# Patient Record
Sex: Female | Born: 1955 | Race: White | Hispanic: No | Marital: Married | Smoking: Never smoker
Health system: Southern US, Community
[De-identification: ages and names within clinical notes are randomized; demographics above are authoritative.]

## PROBLEM LIST (undated history)

## (undated) DIAGNOSIS — G709 Myoneural disorder, unspecified: Secondary | ICD-10-CM

## (undated) DIAGNOSIS — E785 Hyperlipidemia, unspecified: Secondary | ICD-10-CM

## (undated) DIAGNOSIS — D649 Anemia, unspecified: Secondary | ICD-10-CM

## (undated) DIAGNOSIS — M1990 Unspecified osteoarthritis, unspecified site: Secondary | ICD-10-CM

## (undated) DIAGNOSIS — M1711 Unilateral primary osteoarthritis, right knee: Secondary | ICD-10-CM

## (undated) DIAGNOSIS — G4730 Sleep apnea, unspecified: Secondary | ICD-10-CM

## (undated) DIAGNOSIS — F419 Anxiety disorder, unspecified: Secondary | ICD-10-CM

## (undated) DIAGNOSIS — M2550 Pain in unspecified joint: Secondary | ICD-10-CM

## (undated) DIAGNOSIS — I10 Essential (primary) hypertension: Secondary | ICD-10-CM

## (undated) DIAGNOSIS — G629 Polyneuropathy, unspecified: Secondary | ICD-10-CM

## (undated) DIAGNOSIS — R1310 Dysphagia, unspecified: Secondary | ICD-10-CM

## (undated) DIAGNOSIS — G4700 Insomnia, unspecified: Secondary | ICD-10-CM

## (undated) DIAGNOSIS — Z860100 Personal history of colon polyps, unspecified: Secondary | ICD-10-CM

## (undated) DIAGNOSIS — R252 Cramp and spasm: Secondary | ICD-10-CM

## (undated) DIAGNOSIS — R350 Frequency of micturition: Secondary | ICD-10-CM

## (undated) DIAGNOSIS — H269 Unspecified cataract: Secondary | ICD-10-CM

## (undated) DIAGNOSIS — C44311 Basal cell carcinoma of skin of nose: Secondary | ICD-10-CM

## (undated) DIAGNOSIS — J302 Other seasonal allergic rhinitis: Secondary | ICD-10-CM

## (undated) DIAGNOSIS — M2540 Effusion, unspecified joint: Secondary | ICD-10-CM

## (undated) DIAGNOSIS — E119 Type 2 diabetes mellitus without complications: Secondary | ICD-10-CM

## (undated) HISTORY — PX: COLONOSCOPY: SHX174

## (undated) HISTORY — PX: JOINT REPLACEMENT: SHX530

## (undated) HISTORY — PX: ABDOMINAL HYSTERECTOMY: SHX81

## (undated) HISTORY — PX: CLOSED REDUCTION NASAL FRACTURE: SUR256

## (undated) HISTORY — PX: TONSILLECTOMY: SUR1361

---
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
## 2001-06-23 ENCOUNTER — Other Ambulatory Visit: Admission: RE | Admit: 2001-06-23 | Discharge: 2001-06-23 | Payer: Self-pay | Admitting: Obstetrics & Gynecology

## 2001-09-13 ENCOUNTER — Ambulatory Visit (HOSPITAL_COMMUNITY): Admission: RE | Admit: 2001-09-13 | Discharge: 2001-09-13 | Payer: Self-pay | Admitting: Obstetrics and Gynecology

## 2001-09-13 ENCOUNTER — Encounter (INDEPENDENT_AMBULATORY_CARE_PROVIDER_SITE_OTHER): Payer: Self-pay | Admitting: Specialist

## 2002-02-08 HISTORY — PX: HYSTEROSCOPY WITH RESECTOSCOPE: SHX5395

## 2006-12-27 ENCOUNTER — Ambulatory Visit (HOSPITAL_BASED_OUTPATIENT_CLINIC_OR_DEPARTMENT_OTHER): Admission: RE | Admit: 2006-12-27 | Discharge: 2006-12-27 | Payer: Self-pay | Admitting: Otolaryngology

## 2009-01-01 ENCOUNTER — Ambulatory Visit (HOSPITAL_COMMUNITY): Admission: RE | Admit: 2009-01-01 | Discharge: 2009-01-01 | Payer: Self-pay | Admitting: Family Medicine

## 2009-01-10 ENCOUNTER — Encounter: Admission: RE | Admit: 2009-01-10 | Discharge: 2009-01-10 | Payer: Self-pay | Admitting: Family Medicine

## 2009-08-06 ENCOUNTER — Encounter: Admission: RE | Admit: 2009-08-06 | Discharge: 2009-08-06 | Payer: Self-pay | Admitting: Family Medicine

## 2010-03-04 ENCOUNTER — Ambulatory Visit (HOSPITAL_COMMUNITY)
Admission: RE | Admit: 2010-03-04 | Discharge: 2010-03-04 | Payer: Self-pay | Source: Home / Self Care | Attending: Family Medicine | Admitting: Family Medicine

## 2010-06-04 ENCOUNTER — Other Ambulatory Visit (HOSPITAL_COMMUNITY)
Admission: RE | Admit: 2010-06-04 | Discharge: 2010-06-04 | Disposition: A | Discharge status: Home / Self Care | Payer: 59 | Source: Ambulatory Visit | Attending: Family Medicine | Admitting: Family Medicine

## 2010-06-04 ENCOUNTER — Other Ambulatory Visit: Payer: Self-pay | Admitting: Family Medicine

## 2010-06-04 DIAGNOSIS — Z1159 Encounter for screening for other viral diseases: Secondary | ICD-10-CM | POA: Insufficient documentation

## 2010-06-04 DIAGNOSIS — Z124 Encounter for screening for malignant neoplasm of cervix: Secondary | ICD-10-CM | POA: Insufficient documentation

## 2010-06-23 NOTE — Op Note (Signed)
[name], [name]                 ACCOUNT NO.:  [id number]   MEDICAL RECORD NO.:  [id number]          PATIENT TYPE:  AMB   LOCATION:  DSC                          FACILITY:  [facility]   PHYSICIAN:  Kristine Garbe. Ezzard Standing, M.D.DATE OF BIRTH:  [birth year]   DATE OF PROCEDURE:  12/27/2006  DATE OF DISCHARGE:                               OPERATIVE REPORT   PREOPERATIVE DIAGNOSIS:  Nasal fracture.   POSTOPERATIVE DIAGNOSIS:  Nasal fracture.   OPERATION PERFORMED:  Closed reduction nasal fracture.   SURGEON:  Kristine Garbe. Ezzard Standing, M.D.   ANESTHESIA:  LMA.   COMPLICATIONS:  None.   CLINICAL NOTE:  Erika [name] is a 55 year old female who fell down a  flight of stairs approximately ten days ago sustaining an abrasion of  her nasal dorsum and a nasal fracture.  Initially, it really only had  minimal displacement but after swelling resolved, she had slight  deformity of her nasal dorsum to the right side.  She is taken to the  operating room at this time for closed reduction nasal fracture  secondary to displacement of the nasal bones to the right.   DESCRIPTION OF PROCEDURE:  After adequate general anesthesia with LMA,  using a butter knife and manual manipulation, the nasal bones were  positioned back toward the midline.  The nasal passages were clear  bilaterally.  There was no significant septal deformity and no evidence  of septal hematoma.  After adequate reduction of the nasal bones, Steri-  Strips and a Thermoplastic cast was applied to the nasal dorsum.  K[place]eah  tolerated this well, was awakened from anesthesia, and transferred to  the recovery room postoperatively doing well.   DISPOSITION:  [name] is discharged home later this morning on Tylenol  and Vicodin p.r.n. pain.  We will have her follow up in the office in  one week for recheck and have the Thermoplastic cast removed.           ______________________________  Kristine Garbe Ezzard Standing, M.D.     CEN/MEDQ  D:   12/27/2006  T:  12/27/2006  Job:  [id number]   cc:   Molly Maduro A. Nicholos Johns, M.D.

## 2010-06-26 NOTE — Op Note (Signed)
NAME:  Erika [name], Erika [name]                           ACCOUNT NO.:  [id number]   MEDICAL RECORD NO.:  [id number]                   PATIENT TYPE:  AMB   LOCATION:  SDC                                  FACILITY:  [facility]   PHYSICIAN:  Sherry A. Rosalio Macadamia, M.D.           DATE OF BIRTH:  September 20, 1955   DATE OF PROCEDURE:  09/13/2001  DATE OF DISCHARGE:  09/13/2001                                 OPERATIVE REPORT   PREOPERATIVE DIAGNOSES:  Menorrhagia, submucosal fibroids.   POSTOPERATIVE DIAGNOSES:  Menorrhagia, submucosal fibroids.   PROCEDURE:  Dilatation and curettage, hysteroscopy with resectoscope.   SURGEON:  Sherry A. Rosalio Macadamia, M.D.   ANESTHESIA:  MAC.   INDICATIONS:  This is a 55 year old G1, P1-0-0-1 woman who has had very  heavy bleeding with her menstrual periods lasting seven to nine days.  The  patient has been evaluated by ultrasound which revealed multiple fibroids  present.  The endometrial cavity was evaluated using a sonohysterogram.  Sonohysterogram revealed submucosal fibroids present.  Because of this the  patient is brought to the operating room for [facility], hysteroscopy with  resectoscope.   FINDINGS:  A 10-11 week sized anteflexed uterus.  No adnexal mass.  Multiple  submucosal fibroids present.   PROCEDURE:  The patient was brought into the operating room and given  adequate IV sedation.  She was placed in the dorsal lithotomy position.  Her  perineum and vagina were washed with Hibiclens.  The patient was draped in a  sterile fashion.  Pelvic examination was performed.  Speculum was placed in  the vagina.  Vagina was washed with Hibiclens.  Paracervical block was  administered with 1% Nesacaine.  Anterior lip of the cervix was grasped with  a single tooth tenaculum.  Cervix was sounded.  Cervix was dilated with  Pratt dilators to a 31.  Hysteroscope was introduced into the endometrial  cavity.  Pictures were obtained.  Using a double loop right angle resector,  submucosal fibroids were removed in sheets because there were several  different fibroids present.  Once this was removed it was difficult to  identify a normal endometrial plane.  Also, a submucosal fibroid was removed  that was felt to be safe.  There were no complications from surgery.  Adequate hemostasis was present.  Instruments removed from the cervix and  uterus.  A small amount of bleeding from the tenaculum site.  This was  stopped with Monsel solution.  All instruments removed from the vagina.  The  patient was taken out of the dorsal lithotomy position.  She was awakened.  She was moved from the operating table to a stretcher in stable condition.   COMPLICATIONS:  None.   ESTIMATED BLOOD LOSS:  Less than 5 cc.   SORBITOL DIFFERENTIAL:  -80 cc.  Sherry A. Rosalio Macadamia, M.D.   SAD/MEDQ  D:  09/13/2001  T:  09/15/2001  Job:  [id number]

## 2011-04-26 ENCOUNTER — Other Ambulatory Visit (HOSPITAL_COMMUNITY): Payer: Self-pay | Admitting: Family Medicine

## 2011-04-26 DIAGNOSIS — Z1231 Encounter for screening mammogram for malignant neoplasm of breast: Secondary | ICD-10-CM

## 2011-05-20 ENCOUNTER — Ambulatory Visit (HOSPITAL_COMMUNITY)
Admission: RE | Admit: 2011-05-20 | Discharge: 2011-05-20 | Disposition: A | Discharge status: Home / Self Care | Payer: 59 | Source: Ambulatory Visit | Attending: Family Medicine | Admitting: Family Medicine

## 2011-05-20 DIAGNOSIS — Z1231 Encounter for screening mammogram for malignant neoplasm of breast: Secondary | ICD-10-CM | POA: Insufficient documentation

## 2011-06-25 ENCOUNTER — Other Ambulatory Visit: Payer: Self-pay | Admitting: Gastroenterology

## 2011-07-15 ENCOUNTER — Encounter: Payer: Self-pay | Admitting: Internal Medicine

## 2012-02-09 HISTORY — PX: MELANOMA EXCISION: SHX5266

## 2013-02-23 ENCOUNTER — Other Ambulatory Visit (HOSPITAL_COMMUNITY): Payer: Self-pay | Admitting: Family Medicine

## 2013-02-23 DIAGNOSIS — Z1231 Encounter for screening mammogram for malignant neoplasm of breast: Secondary | ICD-10-CM

## 2013-03-01 ENCOUNTER — Ambulatory Visit (HOSPITAL_COMMUNITY)
Admission: RE | Admit: 2013-03-01 | Discharge: 2013-03-01 | Disposition: A | Discharge status: Home / Self Care | Payer: 59 | Source: Ambulatory Visit | Attending: Family Medicine | Admitting: Family Medicine

## 2013-03-01 DIAGNOSIS — Z1231 Encounter for screening mammogram for malignant neoplasm of breast: Secondary | ICD-10-CM | POA: Insufficient documentation

## 2013-03-16 ENCOUNTER — Other Ambulatory Visit: Payer: Self-pay | Admitting: Orthopedic Surgery

## 2013-03-22 ENCOUNTER — Encounter: Payer: Self-pay | Admitting: *Deleted

## 2013-03-23 ENCOUNTER — Institutional Professional Consult (permissible substitution): Payer: 59 | Admitting: Cardiology

## 2013-03-23 ENCOUNTER — Ambulatory Visit (INDEPENDENT_AMBULATORY_CARE_PROVIDER_SITE_OTHER): Payer: 59 | Admitting: Cardiology

## 2013-03-23 ENCOUNTER — Encounter: Payer: Self-pay | Admitting: Cardiology

## 2013-03-23 VITALS — BP 130/80 | HR 75 | Wt 194.0 lb

## 2013-03-23 DIAGNOSIS — R9431 Abnormal electrocardiogram [ECG] [EKG]: Secondary | ICD-10-CM | POA: Insufficient documentation

## 2013-03-23 DIAGNOSIS — E785 Hyperlipidemia, unspecified: Secondary | ICD-10-CM | POA: Insufficient documentation

## 2013-03-23 DIAGNOSIS — E119 Type 2 diabetes mellitus without complications: Secondary | ICD-10-CM

## 2013-03-23 NOTE — Patient Instructions (Signed)
Your physician recommends that you schedule a follow-up appointment in: AS NEEDED  Your physician recommends that you continue on your current medications as directed. Please refer to the Current Medication list given to you today.  

## 2013-03-23 NOTE — Progress Notes (Signed)
Patient ID: ADIE VILAR, female   DOB: Jan 03, 1956, 58 y.o.   MRN: [id number]     Patient Name: Erika [name] Date of Encounter: 03/23/2013  Primary Care Provider:  No primary provider on file. Primary Cardiologist:  Ena Dawley, H  Problem List   Past Medical History  Diagnosis Date  . Fibroids, submucosal     HISTORY OF  . Menorrhagia    Past Surgical History  Procedure Laterality Date  . Dilation and curettage, diagnostic / therapeutic    . Hysteroscopy with resectoscope    . Closed reduction nasal fracture      Allergies  Allergies not on file  HPI  A very pleasant 58 year old female with prior medical history of non-insulin-dependent diabetes mellitus, and hyperlipidemia who is being referred to [place] for preop evaluation prior to total knee replacement. The patient has been very active her entire life, exercising for an hour a day in the gym  with absolutely no limitation in regards to chest pain, shortness of breath or palpitation. No prior syncope, no orthopnea or paroxysmal nocturnal dyspnea. She is currently limited in her physical activity secondary to her severe pain in her right knee however she is still able to walk him around the store without any shortness of breath or chest pain. Her limiting factor will be her knee pain. Patient has no prior cardiac history. No hypertension. She has been compliant to Crestor. She is referred to [place] for abnormal EKG. She has no family history of coronary artery disease.  Home Medications  aspirin 81 MG tablet, Take 81 mg by mouth daily lisinopril (PRINIVIL,ZESTRIL) 2.5 MG tablet,  Omega-3 Fatty Acids (FISH OIL PO),  rosuvastatin (CRESTOR) 10 MG tablet, Take 10 mg by mouth daily sitaGLIPtin-metformin (JANUMET) 50-500 MG per tablet,   Family History  No family history on file.  Social History  History   Social History  . Marital Status: Married    Spouse Name: N/A    Number of Children: N/A  . Years of Education:  N/A   Occupational History  . Not on file.   Social History Main Topics  . Smoking status: Not on file  . Smokeless tobacco: Not on file  . Alcohol Use: Not on file  . Drug Use: Not on file  . Sexual Activity: Not on file   Other Topics Concern  . Not on file   Social History Narrative  . No narrative on file     Review of Systems, as per HPI, otherwise negative General:  No chills, fever, night sweats or weight changes.  Cardiovascular:  No chest pain, dyspnea on exertion, edema, orthopnea, palpitations, paroxysmal nocturnal dyspnea. Dermatological: No rash, lesions/masses Respiratory: No cough, dyspnea Urologic: No hematuria, dysuria Abdominal:   No nausea, vomiting, diarrhea, bright red blood per rectum, melena, or hematemesis Neurologic:  No visual changes, wkns, changes in mental status. All other systems reviewed and are otherwise negative except as noted above.  Physical Exam  BP 130/80, HR 75 BPM General: Pleasant, NAD Psych: Normal affect. Neuro: Alert and oriented X 3. Moves all extremities spontaneously. HEENT: Normal  Neck: Supple without bruits or JVD. Lungs:  Resp regular and unlabored, CTA. Heart: RRR no s3, s4, or murmurs. Abdomen: Soft, non-tender, non-distended, BS + x 4.  Extremities: No clubbing, cyanosis or edema. DP/PT/Radials 2+ and equal bilaterally.  Labs:  No results found for this basename: CKTOTAL, CKMB, TROPONINI,  in the last 72 hours Lab Results  Component Value Date  HGB 14.7 12/27/2006   Accessory Clinical Findings  Echocardiogram - none  ECG - normal sinus rhythm, low-voltage QRS, borderline EKG   Assessment & Plan  A very pleasant 58 year old female with prior medical history of hyperlipidemia and non-insulin-dependent diabetes mellitus who is scheduled for total knee replacement. The patient has no symptoms suggesting ischemia or congestive heart failure. EKG performed in our office is normal and is unchanged from the one  performed at her primary care physician office. Negative T waves in leads V1 and V2 are considered normal variants and are not suggestive of ischemia. This patient was previously very functional and currently is still able to perform at least 4 METs. There is currently no contraindication from cardiac standpoint for this patient to undergo her surgery and she is considered a low risk for intermediate risk surgery.  Thank you for referring this patient to [place].  Ena Dawley, Lemmie Evens, MD, [facility] 03/23/2013, 2:04 PM

## 2013-03-28 ENCOUNTER — Encounter (HOSPITAL_COMMUNITY): Payer: Self-pay | Admitting: Pharmacy Technician

## 2013-04-02 ENCOUNTER — Encounter (HOSPITAL_COMMUNITY)
Admission: RE | Admit: 2013-04-02 | Discharge: 2013-04-02 | Disposition: A | Discharge status: Home / Self Care | Payer: 59 | Source: Ambulatory Visit | Attending: Orthopedic Surgery | Admitting: Orthopedic Surgery

## 2013-04-02 ENCOUNTER — Encounter (HOSPITAL_COMMUNITY): Payer: Self-pay

## 2013-04-02 DIAGNOSIS — Z01812 Encounter for preprocedural laboratory examination: Secondary | ICD-10-CM | POA: Insufficient documentation

## 2013-04-02 HISTORY — DX: Dysphagia, unspecified: R13.10

## 2013-04-02 HISTORY — DX: Anxiety disorder, unspecified: F41.9

## 2013-04-02 HISTORY — DX: Anemia, unspecified: D64.9

## 2013-04-02 HISTORY — DX: Polyneuropathy, unspecified: G62.9

## 2013-04-02 HISTORY — DX: Personal history of colonic polyps: Z86.010

## 2013-04-02 HISTORY — DX: Unspecified cataract: H26.9

## 2013-04-02 HISTORY — DX: Frequency of micturition: R35.0

## 2013-04-02 HISTORY — DX: Unspecified osteoarthritis, unspecified site: M19.90

## 2013-04-02 HISTORY — DX: Personal history of colon polyps, unspecified: Z86.0100

## 2013-04-02 HISTORY — DX: Cramp and spasm: R25.2

## 2013-04-02 HISTORY — DX: Other seasonal allergic rhinitis: J30.2

## 2013-04-02 HISTORY — DX: Insomnia, unspecified: G47.00

## 2013-04-02 HISTORY — DX: Pain in unspecified joint: M25.50

## 2013-04-02 HISTORY — DX: Effusion, unspecified joint: M25.40

## 2013-04-02 HISTORY — DX: Hyperlipidemia, unspecified: E78.5

## 2013-04-02 LAB — BASIC METABOLIC PANEL
BUN: 14 mg/dL (ref 6–23)
CHLORIDE: 101 meq/L (ref 96–112)
CO2: 28 mEq/L (ref 19–32)
Calcium: 10 mg/dL (ref 8.4–10.5)
Creatinine, Ser: 0.58 mg/dL (ref 0.50–1.10)
GFR calc non Af Amer: >90 mL/min (ref >90)
Glucose, Bld: 132 mg/dL — ABNORMAL HIGH (ref 70–99)
POTASSIUM: 4.3 meq/L (ref 3.7–5.3)
SODIUM: 140 meq/L (ref 137–147)

## 2013-04-02 LAB — ABO/RH: ABO/RH(D): O NEG

## 2013-04-02 LAB — CBC
HEMATOCRIT: 42.2 % (ref 36.0–46.0)
Hemoglobin: 14.4 g/dL (ref 12.0–15.0)
MCH: 32 pg (ref 26.0–34.0)
MCHC: 34.1 g/dL (ref 30.0–36.0)
MCV: 93.8 fL (ref 78.0–100.0)
Platelets: 213 10*3/uL (ref 150–400)
RBC: 4.5 MIL/uL (ref 3.87–5.11)
RDW: 12.9 % (ref 11.5–15.5)
WBC: 7.4 10*3/uL (ref 4.0–10.5)

## 2013-04-02 LAB — SURGICAL PCR SCREEN
MRSA, PCR: NEGATIVE
STAPHYLOCOCCUS AUREUS: POSITIVE — AB

## 2013-04-02 LAB — TYPE AND SCREEN
ABO/RH(D): O NEG
ANTIBODY SCREEN: NEGATIVE

## 2013-04-02 LAB — PROTIME-INR
INR: 0.96 (ref 0.00–1.49)
PROTHROMBIN TIME: 12.6 s (ref 11.6–15.2)

## 2013-04-02 LAB — APTT: APTT: 27 s (ref 24–37)

## 2013-04-02 MED ORDER — CHLORHEXIDINE GLUCONATE CLOTH 2 % EX PADS: 6.0000 | MEDICATED_PAD | Freq: Once | CUTANEOUS | Status: DC

## 2013-04-02 MED ORDER — VANCOMYCIN HCL IN DEXTROSE 1-5 GM/200ML-% IV SOLN: 1000.0000 mg | Freq: Once | INTRAVENOUS | Status: DC

## 2013-04-02 MED ORDER — CLINDAMYCIN PHOSPHATE 600 MG/50ML IV SOLN: 600.0000 mg | Freq: Once | INTRAVENOUS | Status: DC

## 2013-04-02 NOTE — Progress Notes (Signed)
Called office of Dr.Landau to see if he wants to change antibiotic d/t PCN allergy causing hives-awaiting to hear back

## 2013-04-02 NOTE — Pre-Procedure Instructions (Signed)
Erika [name]  04/02/2013   Your procedure is scheduled on:  Tues, Mar 3 @ 7:30 AM  Report to [facility] Stay Entrance A  at 5:30 AM.  Call this number if you have problems the morning of surgery: [phone]   Remember:   Do not eat food or drink liquids after midnight.   Take these medicines the morning of surgery with A SIP OF WATER: Eye Drops,Omnaris(Ciclesonide-if needed),Clonazepam(Klonopin),Claritin(Loratadine-if needed),and Tramadol(Ultram-if needed)               Stop taking your Aspirin and Fish Oil, No Goody's,BC's,Aleve,Ibuprofen,or any Herbal Medications   Do not wear jewelry, make-up or nail polish.  Do not wear lotions, powders, or perfumes. You may wear deodorant.  Do not shave 48 hours prior to surgery.   Do not bring valuables to the hospital.  [facility] is not responsible                  for any belongings or valuables.               Contacts, dentures or bridgework may not be worn into surgery.  Leave suitcase in the car. After surgery it may be brought to your room.  For patients admitted to the hospital, discharge time is determined by your                treatment team.               Special Instructions:  [facility] - Preparing for Surgery  Before surgery, you can play an important role.  Because skin is not sterile, your skin needs to be as free of germs as possible.  You can reduce the number of germs on you skin by washing with CHG (chlorahexidine gluconate) soap before surgery.  CHG is an antiseptic cleaner which kills germs and bonds with the skin to continue killing germs even after washing.  Please DO NOT use if you have an allergy to CHG or antibacterial soaps.  If your skin becomes reddened/irritated stop using the CHG and inform your nurse when you arrive at Short Stay.  Do not shave (including legs and underarms) for at least 48 hours prior to the first CHG shower.  You may shave your face.  Please follow these instructions  carefully:   1.  Shower with CHG Soap the night before surgery and the                                morning of Surgery.  2.  If you choose to wash your hair, wash your hair first as usual with your       normal shampoo.  3.  After you shampoo, rinse your hair and body thoroughly to remove the                      Shampoo.  4.  Use CHG as you would any other liquid soap.  You can apply chg directly       to the skin and wash gently with scrungie or a clean washcloth.  5.  Apply the CHG Soap to your body ONLY FROM THE NECK DOWN.        Do not use on open wounds or open sores.  Avoid contact with your eyes,       ears, mouth and genitals (private parts).  Wash genitals (private parts)  with your normal soap.  6.  Wash thoroughly, paying special attention to the area where your surgery        will be performed.  7.  Thoroughly rinse your body with warm water from the neck down.  8.  DO NOT shower/wash with your normal soap after using and rinsing off       the CHG Soap.  9.  Pat yourself dry with a clean towel.            10.  Wear clean pajamas.            11.  Place clean sheets on your bed the night of your first shower and do not        sleep with pets.  Day of Surgery  Do not apply any lotions/deoderants the morning of surgery.  Please wear clean clothes to the hospital/surgery center.     Please read over the following fact sheets that you were given: Pain Booklet, Coughing and Deep Breathing, Blood Transfusion Information, MRSA Information and Surgical Site Infection Prevention

## 2013-04-02 NOTE — Progress Notes (Addendum)
Saw a cardiologist Dr. Anders Simmonds last visit in epic  Denies ever having an echo/stress test/heart cath     Medical Md is   With [facility] [place]           But sees PA Carlos Levering  EKG in epic from 03-23-13  Denies CXR in past yr

## 2013-04-02 NOTE — Progress Notes (Signed)
Mupirocin script called into [facility] at [place]

## 2013-04-02 NOTE — Progress Notes (Signed)
Takes lisinopril to protect kidneys not for HTN

## 2013-04-10 ENCOUNTER — Encounter (HOSPITAL_COMMUNITY)
Admission: RE | Disposition: A | Discharge status: Home / Self Care | Payer: Self-pay | Source: Ambulatory Visit | Attending: Orthopedic Surgery

## 2013-04-10 ENCOUNTER — Inpatient Hospital Stay (HOSPITAL_COMMUNITY): Payer: 59

## 2013-04-10 ENCOUNTER — Inpatient Hospital Stay (HOSPITAL_COMMUNITY)
Admission: RE | Admit: 2013-04-10 | Discharge: 2013-04-11 | DRG: 470 | Disposition: A | Discharge status: Home / Self Care | Payer: 59 | Source: Ambulatory Visit | Attending: Orthopedic Surgery | Admitting: Orthopedic Surgery

## 2013-04-10 ENCOUNTER — Encounter (HOSPITAL_COMMUNITY): Payer: 59 | Admitting: Anesthesiology

## 2013-04-10 ENCOUNTER — Encounter (HOSPITAL_COMMUNITY): Payer: Self-pay | Admitting: *Deleted

## 2013-04-10 ENCOUNTER — Inpatient Hospital Stay (HOSPITAL_COMMUNITY): Payer: 59 | Admitting: Anesthesiology

## 2013-04-10 DIAGNOSIS — G4700 Insomnia, unspecified: Secondary | ICD-10-CM | POA: Diagnosis present

## 2013-04-10 DIAGNOSIS — F411 Generalized anxiety disorder: Secondary | ICD-10-CM | POA: Diagnosis present

## 2013-04-10 DIAGNOSIS — Z7982 Long term (current) use of aspirin: Secondary | ICD-10-CM

## 2013-04-10 DIAGNOSIS — E119 Type 2 diabetes mellitus without complications: Secondary | ICD-10-CM | POA: Diagnosis present

## 2013-04-10 DIAGNOSIS — Z79899 Other long term (current) drug therapy: Secondary | ICD-10-CM

## 2013-04-10 DIAGNOSIS — M1711 Unilateral primary osteoarthritis, right knee: Secondary | ICD-10-CM

## 2013-04-10 DIAGNOSIS — M1710 Unilateral primary osteoarthritis, unspecified knee: Principal | ICD-10-CM | POA: Diagnosis present

## 2013-04-10 DIAGNOSIS — M179 Osteoarthritis of knee, unspecified: Secondary | ICD-10-CM | POA: Diagnosis present

## 2013-04-10 DIAGNOSIS — E785 Hyperlipidemia, unspecified: Secondary | ICD-10-CM | POA: Diagnosis present

## 2013-04-10 HISTORY — PX: TOTAL KNEE ARTHROPLASTY: SHX125

## 2013-04-10 HISTORY — DX: Unilateral primary osteoarthritis, right knee: M17.11

## 2013-04-10 HISTORY — DX: Type 2 diabetes mellitus without complications: E11.9

## 2013-04-10 HISTORY — DX: Basal cell carcinoma of skin of nose: C44.311

## 2013-04-10 LAB — CBC
HCT: 41 % (ref 36.0–46.0)
Hemoglobin: 14.1 g/dL (ref 12.0–15.0)
MCH: 32 pg (ref 26.0–34.0)
MCHC: 34.4 g/dL (ref 30.0–36.0)
MCV: 93.2 fL (ref 78.0–100.0)
PLATELETS: 206 10*3/uL (ref 150–400)
RBC: 4.4 MIL/uL (ref 3.87–5.11)
RDW: 12.5 % (ref 11.5–15.5)
WBC: 15.7 10*3/uL — ABNORMAL HIGH (ref 4.0–10.5)

## 2013-04-10 LAB — CREATININE, SERUM
CREATININE: 0.53 mg/dL (ref 0.50–1.10)
GFR calc Af Amer: >90 mL/min (ref >90)
GFR calc non Af Amer: >90 mL/min (ref >90)

## 2013-04-10 LAB — GLUCOSE, CAPILLARY
GLUCOSE-CAPILLARY: 131 mg/dL — AB (ref 70–99)
GLUCOSE-CAPILLARY: 158 mg/dL — AB (ref 70–99)
GLUCOSE-CAPILLARY: 201 mg/dL — AB (ref 70–99)
Glucose-Capillary: 180 mg/dL — ABNORMAL HIGH (ref 70–99)
Glucose-Capillary: 190 mg/dL — ABNORMAL HIGH (ref 70–99)

## 2013-04-10 SURGERY — ARTHROPLASTY, KNEE, TOTAL
Anesthesia: Regional | Site: Knee | Laterality: Right

## 2013-04-10 MED ORDER — VANCOMYCIN HCL IN DEXTROSE 1-5 GM/200ML-% IV SOLN
1000.0000 mg | Freq: Two times a day (BID) | INTRAVENOUS | Status: AC
  Administered 2013-04-10: 1000 mg via INTRAVENOUS
  Filled 2013-04-10: qty 200

## 2013-04-10 MED ORDER — ACETAMINOPHEN 650 MG RE SUPP: 650.0000 mg | Freq: Four times a day (QID) | RECTAL | Status: DC | PRN

## 2013-04-10 MED ORDER — OXYCODONE HCL 5 MG PO TABS: 5.0000 mg | ORAL_TABLET | Freq: Once | ORAL | Status: DC | PRN

## 2013-04-10 MED ORDER — MIDAZOLAM HCL 2 MG/2ML IJ SOLN
INTRAMUSCULAR | Status: AC
  Filled 2013-04-10: qty 2

## 2013-04-10 MED ORDER — OXYCODONE-ACETAMINOPHEN 10-325 MG PO TABS: 1.0000 | ORAL_TABLET | Freq: Four times a day (QID) | ORAL | Status: DC | PRN

## 2013-04-10 MED ORDER — ENOXAPARIN SODIUM 30 MG/0.3ML ~~LOC~~ SOLN
30.0000 mg | Freq: Two times a day (BID) | SUBCUTANEOUS | Status: DC
  Administered 2013-04-11: 30 mg via SUBCUTANEOUS
  Filled 2013-04-10 (×3): qty 0.3

## 2013-04-10 MED ORDER — POLYVINYL ALCOHOL 1.4 % OP SOLN
2.0000 [drp] | Freq: Three times a day (TID) | OPHTHALMIC | Status: DC
  Administered 2013-04-10 – 2013-04-11 (×3): 2 [drp] via OPHTHALMIC
  Filled 2013-04-10: qty 15

## 2013-04-10 MED ORDER — ASPIRIN EC 325 MG PO TBEC
325.0000 mg | DELAYED_RELEASE_TABLET | Freq: Every day | ORAL | Status: DC
  Filled 2013-04-10: qty 1

## 2013-04-10 MED ORDER — ASPIRIN EC 325 MG PO TBEC
325.0000 mg | DELAYED_RELEASE_TABLET | Freq: Every day | ORAL | Status: DC
  Administered 2013-04-10 – 2013-04-11 (×2): 325 mg via ORAL
  Filled 2013-04-10 (×2): qty 1

## 2013-04-10 MED ORDER — ARTIFICIAL TEARS OP OINT
TOPICAL_OINTMENT | OPHTHALMIC | Status: AC
  Filled 2013-04-10: qty 3.5

## 2013-04-10 MED ORDER — INSULIN ASPART 100 UNIT/ML ~~LOC~~ SOLN
0.0000 [IU] | Freq: Three times a day (TID) | SUBCUTANEOUS | Status: DC
  Administered 2013-04-10 (×2): 3 [IU] via SUBCUTANEOUS
  Administered 2013-04-11 (×2): 2 [IU] via SUBCUTANEOUS

## 2013-04-10 MED ORDER — PROMETHAZINE HCL 25 MG PO TABS: 25.0000 mg | ORAL_TABLET | Freq: Four times a day (QID) | ORAL | Status: DC | PRN

## 2013-04-10 MED ORDER — ONDANSETRON HCL 4 MG/2ML IJ SOLN: 4.0000 mg | Freq: Once | INTRAMUSCULAR | Status: DC | PRN

## 2013-04-10 MED ORDER — STERILE WATER FOR INJECTION IJ SOLN
INTRAMUSCULAR | Status: AC
  Filled 2013-04-10: qty 10

## 2013-04-10 MED ORDER — ONDANSETRON HCL 4 MG PO TABS: 4.0000 mg | ORAL_TABLET | Freq: Four times a day (QID) | ORAL | Status: DC | PRN

## 2013-04-10 MED ORDER — ALUM & MAG HYDROXIDE-SIMETH 200-200-20 MG/5ML PO SUSP: 30.0000 mL | ORAL | Status: DC | PRN

## 2013-04-10 MED ORDER — PHENYLEPHRINE HCL 10 MG/ML IJ SOLN
INTRAMUSCULAR | Status: DC | PRN
  Administered 2013-04-10: 80 ug via INTRAVENOUS

## 2013-04-10 MED ORDER — MIDAZOLAM HCL 5 MG/5ML IJ SOLN
INTRAMUSCULAR | Status: DC | PRN
  Administered 2013-04-10: 2 mg via INTRAVENOUS

## 2013-04-10 MED ORDER — MIDAZOLAM HCL 5 MG/5ML IJ SOLN: INTRAMUSCULAR | Status: DC | PRN

## 2013-04-10 MED ORDER — HYDROMORPHONE HCL PF 1 MG/ML IJ SOLN
1.0000 mg | INTRAMUSCULAR | Status: DC | PRN
  Administered 2013-04-10: 1 mg via INTRAVENOUS
  Filled 2013-04-10: qty 1

## 2013-04-10 MED ORDER — METOCLOPRAMIDE HCL 5 MG/ML IJ SOLN: 5.0000 mg | Freq: Three times a day (TID) | INTRAMUSCULAR | Status: DC | PRN

## 2013-04-10 MED ORDER — TRAMADOL HCL 50 MG PO TABS: 50.0000 mg | ORAL_TABLET | Freq: Two times a day (BID) | ORAL | Status: DC | PRN

## 2013-04-10 MED ORDER — VANCOMYCIN HCL IN DEXTROSE 1-5 GM/200ML-% IV SOLN
INTRAVENOUS | Status: AC
  Administered 2013-04-10: 1000 mg via INTRAVENOUS
  Filled 2013-04-10: qty 200

## 2013-04-10 MED ORDER — DOCUSATE SODIUM 100 MG PO CAPS
100.0000 mg | ORAL_CAPSULE | Freq: Two times a day (BID) | ORAL | Status: DC
  Administered 2013-04-10 – 2013-04-11 (×3): 100 mg via ORAL
  Filled 2013-04-10 (×4): qty 1

## 2013-04-10 MED ORDER — SITAGLIPTIN PHOS-METFORMIN HCL 50-500 MG PO TABS: 1.0000 | ORAL_TABLET | Freq: Two times a day (BID) | ORAL | Status: DC

## 2013-04-10 MED ORDER — LINAGLIPTIN 5 MG PO TABS
5.0000 mg | ORAL_TABLET | Freq: Every day | ORAL | Status: DC
  Administered 2013-04-11: 5 mg via ORAL
  Filled 2013-04-10 (×2): qty 1

## 2013-04-10 MED ORDER — CLINDAMYCIN PHOSPHATE 600 MG/50ML IV SOLN
600.0000 mg | Freq: Once | INTRAVENOUS | Status: DC
  Filled 2013-04-10: qty 50

## 2013-04-10 MED ORDER — CLINDAMYCIN PHOSPHATE 600 MG/50ML IV SOLN
INTRAVENOUS | Status: AC
  Filled 2013-04-10: qty 50

## 2013-04-10 MED ORDER — LACTATED RINGERS IV SOLN
INTRAVENOUS | Status: DC | PRN
  Administered 2013-04-10 (×2): via INTRAVENOUS

## 2013-04-10 MED ORDER — LORATADINE 10 MG PO TABS
10.0000 mg | ORAL_TABLET | Freq: Every day | ORAL | Status: DC | PRN
  Filled 2013-04-10: qty 1

## 2013-04-10 MED ORDER — HYDROMORPHONE HCL PF 1 MG/ML IJ SOLN
INTRAMUSCULAR | Status: AC
  Filled 2013-04-10: qty 1

## 2013-04-10 MED ORDER — ONDANSETRON HCL 4 MG/2ML IJ SOLN: 4.0000 mg | Freq: Four times a day (QID) | INTRAMUSCULAR | Status: DC | PRN

## 2013-04-10 MED ORDER — VANCOMYCIN HCL IN DEXTROSE 1-5 GM/200ML-% IV SOLN
1000.0000 mg | Freq: Once | INTRAVENOUS | Status: DC
  Filled 2013-04-10: qty 200

## 2013-04-10 MED ORDER — ATORVASTATIN CALCIUM 10 MG PO TABS
10.0000 mg | ORAL_TABLET | Freq: Every day | ORAL | Status: DC
Start: 2013-04-10 — End: 2013-04-11
  Administered 2013-04-10: 10 mg via ORAL
  Filled 2013-04-10 (×2): qty 1

## 2013-04-10 MED ORDER — HYDROMORPHONE HCL PF 1 MG/ML IJ SOLN
0.2500 mg | INTRAMUSCULAR | Status: DC | PRN
  Administered 2013-04-10: 0.25 mg via INTRAVENOUS
  Administered 2013-04-10: 0.5 mg via INTRAVENOUS
  Administered 2013-04-10: 0.25 mg via INTRAVENOUS

## 2013-04-10 MED ORDER — ENOXAPARIN SODIUM 30 MG/0.3ML ~~LOC~~ SOLN: 30.0000 mg | Freq: Two times a day (BID) | SUBCUTANEOUS | Status: DC

## 2013-04-10 MED ORDER — METOCLOPRAMIDE HCL 10 MG PO TABS: 5.0000 mg | ORAL_TABLET | Freq: Three times a day (TID) | ORAL | Status: DC | PRN

## 2013-04-10 MED ORDER — FENTANYL CITRATE 0.05 MG/ML IJ SOLN
INTRAMUSCULAR | Status: DC | PRN
  Administered 2013-04-10: 100 ug via INTRAVENOUS
  Administered 2013-04-10: 25 ug via INTRAVENOUS
  Administered 2013-04-10 (×2): 50 ug via INTRAVENOUS
  Administered 2013-04-10: 25 ug via INTRAVENOUS

## 2013-04-10 MED ORDER — POLYETHYLENE GLYCOL 3350 17 G PO PACK: 17.0000 g | PACK | Freq: Every day | ORAL | Status: DC | PRN

## 2013-04-10 MED ORDER — EPHEDRINE SULFATE 50 MG/ML IJ SOLN
INTRAMUSCULAR | Status: DC | PRN
  Administered 2013-04-10: 10 mg via INTRAVENOUS

## 2013-04-10 MED ORDER — LORATADINE 10 MG PO TABS: 10.0000 mg | ORAL_TABLET | Freq: Two times a day (BID) | ORAL | Status: DC | PRN

## 2013-04-10 MED ORDER — OXYCODONE HCL 5 MG/5ML PO SOLN: 5.0000 mg | Freq: Once | ORAL | Status: DC | PRN

## 2013-04-10 MED ORDER — POTASSIUM CHLORIDE IN NACL 20-0.45 MEQ/L-% IV SOLN
INTRAVENOUS | Status: DC
  Administered 2013-04-10 – 2013-04-11 (×2): via INTRAVENOUS
  Filled 2013-04-10 (×3): qty 1000

## 2013-04-10 MED ORDER — LIDOCAINE HCL (CARDIAC) 20 MG/ML IV SOLN
INTRAVENOUS | Status: AC
  Filled 2013-04-10: qty 5

## 2013-04-10 MED ORDER — ARTIFICIAL TEARS 0.1-0.3 % OP SOLN: 2.0000 [drp] | Freq: Three times a day (TID) | OPHTHALMIC | Status: DC

## 2013-04-10 MED ORDER — PROPOFOL 10 MG/ML IV BOLUS
INTRAVENOUS | Status: AC
  Filled 2013-04-10: qty 20

## 2013-04-10 MED ORDER — SODIUM CHLORIDE 0.9 % IR SOLN
Status: DC | PRN
  Administered 2013-04-10: 1000 mL

## 2013-04-10 MED ORDER — MAGNESIUM CITRATE PO SOLN: 1.0000 | Freq: Once | ORAL | Status: AC | PRN

## 2013-04-10 MED ORDER — BISACODYL 10 MG RE SUPP: 10.0000 mg | Freq: Every day | RECTAL | Status: DC | PRN

## 2013-04-10 MED ORDER — METHOCARBAMOL 500 MG PO TABS
500.0000 mg | ORAL_TABLET | Freq: Four times a day (QID) | ORAL | Status: DC | PRN
  Administered 2013-04-11: 500 mg via ORAL
  Filled 2013-04-10: qty 1

## 2013-04-10 MED ORDER — CLONAZEPAM 0.5 MG PO TABS
0.5000 mg | ORAL_TABLET | Freq: Two times a day (BID) | ORAL | Status: DC | PRN
  Administered 2013-04-11: 0.5 mg via ORAL
  Filled 2013-04-10: qty 1

## 2013-04-10 MED ORDER — FENTANYL CITRATE 0.05 MG/ML IJ SOLN
INTRAMUSCULAR | Status: AC
  Filled 2013-04-10: qty 5

## 2013-04-10 MED ORDER — PROPOFOL 10 MG/ML IV BOLUS
INTRAVENOUS | Status: DC | PRN
  Administered 2013-04-10: 150 mg via INTRAVENOUS

## 2013-04-10 MED ORDER — LIDOCAINE HCL (CARDIAC) 20 MG/ML IV SOLN
INTRAVENOUS | Status: DC | PRN
  Administered 2013-04-10: 60 mg via INTRAVENOUS

## 2013-04-10 MED ORDER — DIPHENHYDRAMINE HCL 12.5 MG/5ML PO ELIX: 12.5000 mg | ORAL_SOLUTION | ORAL | Status: DC | PRN

## 2013-04-10 MED ORDER — MENTHOL 3 MG MT LOZG: 1.0000 | LOZENGE | OROMUCOSAL | Status: DC | PRN

## 2013-04-10 MED ORDER — EPHEDRINE SULFATE 50 MG/ML IJ SOLN
INTRAMUSCULAR | Status: AC
  Filled 2013-04-10: qty 1

## 2013-04-10 MED ORDER — ONDANSETRON HCL 4 MG/2ML IJ SOLN
INTRAMUSCULAR | Status: DC | PRN
  Administered 2013-04-10: 4 mg via INTRAVENOUS

## 2013-04-10 MED ORDER — DIPHENHYDRAMINE HCL 25 MG PO CAPS
25.0000 mg | ORAL_CAPSULE | Freq: Every day | ORAL | Status: DC
  Administered 2013-04-10: 25 mg via ORAL
  Filled 2013-04-10: qty 2
  Filled 2013-04-10: qty 1

## 2013-04-10 MED ORDER — ACETAMINOPHEN 325 MG PO TABS: 650.0000 mg | ORAL_TABLET | Freq: Four times a day (QID) | ORAL | Status: DC | PRN

## 2013-04-10 MED ORDER — METHOCARBAMOL 500 MG PO TABS: 500.0000 mg | ORAL_TABLET | Freq: Four times a day (QID) | ORAL | Status: DC

## 2013-04-10 MED ORDER — SENNA 8.6 MG PO TABS
1.0000 | ORAL_TABLET | Freq: Two times a day (BID) | ORAL | Status: DC
  Administered 2013-04-10 – 2013-04-11 (×3): 8.6 mg via ORAL
  Filled 2013-04-10 (×4): qty 1

## 2013-04-10 MED ORDER — ONDANSETRON HCL 4 MG/2ML IJ SOLN
INTRAMUSCULAR | Status: AC
  Filled 2013-04-10: qty 2

## 2013-04-10 MED ORDER — POTASSIUM GLUCONATE 550 MG PO TABS: 2.0000 | ORAL_TABLET | Freq: Every evening | ORAL | Status: DC

## 2013-04-10 MED ORDER — SENNA-DOCUSATE SODIUM 8.6-50 MG PO TABS: 2.0000 | ORAL_TABLET | Freq: Every day | ORAL | Status: DC

## 2013-04-10 MED ORDER — DEXTROSE 5 % IV SOLN
500.0000 mg | Freq: Four times a day (QID) | INTRAVENOUS | Status: DC | PRN
  Filled 2013-04-10: qty 5

## 2013-04-10 MED ORDER — METFORMIN HCL 500 MG PO TABS
500.0000 mg | ORAL_TABLET | Freq: Two times a day (BID) | ORAL | Status: DC
  Administered 2013-04-10 – 2013-04-11 (×3): 500 mg via ORAL
  Filled 2013-04-10 (×5): qty 1

## 2013-04-10 MED ORDER — LISINOPRIL 2.5 MG PO TABS
2.5000 mg | ORAL_TABLET | Freq: Every morning | ORAL | Status: DC
  Administered 2013-04-10 – 2013-04-11 (×2): 2.5 mg via ORAL
  Filled 2013-04-10 (×2): qty 1

## 2013-04-10 MED ORDER — OXYCODONE HCL 5 MG PO TABS
5.0000 mg | ORAL_TABLET | ORAL | Status: DC | PRN
  Administered 2013-04-10 – 2013-04-11 (×6): 10 mg via ORAL
  Filled 2013-04-10 (×7): qty 2

## 2013-04-10 MED ORDER — PHENOL 1.4 % MT LIQD: 1.0000 | OROMUCOSAL | Status: DC | PRN

## 2013-04-10 SURGICAL SUPPLY — 64 items
APL SKNCLS STERI-STRIP NONHPOA (GAUZE/BANDAGES/DRESSINGS) ×1
BANDAGE ELASTIC 6 VELCRO ST LF (GAUZE/BANDAGES/DRESSINGS) ×4 IMPLANT
BANDAGE ESMARK 6X9 LF (GAUZE/BANDAGES/DRESSINGS) ×1 IMPLANT
BENZOIN TINCTURE PRP APPL 2/3 (GAUZE/BANDAGES/DRESSINGS) ×3 IMPLANT
BLADE SAG 18X100X1.27 (BLADE) ×3 IMPLANT
BLADE SAW RECIP 87.9 MT (BLADE) ×3 IMPLANT
BLADE SAW SGTL 13X75X1.27 (BLADE) ×3 IMPLANT
BNDG CMPR 9X6 STRL LF SNTH (GAUZE/BANDAGES/DRESSINGS) ×1
BNDG ESMARK 6X9 LF (GAUZE/BANDAGES/DRESSINGS) ×3
BOOTCOVER CLEANROOM LRG (PROTECTIVE WEAR) ×6 IMPLANT
BOWL SMART MIX CTS (DISPOSABLE) ×3 IMPLANT
CAPT RP KNEE ×2 IMPLANT
CEMENT HV SMART SET (Cement) ×6 IMPLANT
CLOSURE STERI-STRIP 1/2X4 (GAUZE/BANDAGES/DRESSINGS) ×1
CLOSURE WOUND 1/2 X4 (GAUZE/BANDAGES/DRESSINGS) ×1
CLSR STERI-STRIP ANTIMIC 1/2X4 (GAUZE/BANDAGES/DRESSINGS) ×2 IMPLANT
COVER SURGICAL LIGHT HANDLE (MISCELLANEOUS) ×3 IMPLANT
CUFF TOURNIQUET SINGLE 34IN LL (TOURNIQUET CUFF) ×2 IMPLANT
DRAPE EXTREMITY T 121X128X90 (DRAPE) ×3 IMPLANT
DRAPE PROXIMA HALF (DRAPES) ×3 IMPLANT
DRAPE U-SHAPE 47X51 STRL (DRAPES) ×3 IMPLANT
DRSG PAD ABDOMINAL 8X10 ST (GAUZE/BANDAGES/DRESSINGS) ×3 IMPLANT
DURAPREP 26ML APPLICATOR (WOUND CARE) ×3 IMPLANT
ELECT CAUTERY BLADE 6.4 (BLADE) ×3 IMPLANT
ELECT REM PT RETURN 9FT ADLT (ELECTROSURGICAL) ×3
ELECTRODE REM PT RTRN 9FT ADLT (ELECTROSURGICAL) ×1 IMPLANT
FACESHIELD LNG OPTICON STERILE (SAFETY) ×3 IMPLANT
GLOVE BIOGEL PI ORTHO PRO SZ8 (GLOVE) ×4
GLOVE ORTHO TXT STRL SZ7.5 (GLOVE) ×3 IMPLANT
GLOVE PI ORTHO PRO STRL SZ8 (GLOVE) ×2 IMPLANT
GLOVE SURG ORTHO 8.0 STRL STRW (GLOVE) ×3 IMPLANT
GOWN BRE IMP PREV XXLGXLNG (GOWN DISPOSABLE) ×3 IMPLANT
GOWN STRL REIN XL XLG (GOWN DISPOSABLE) ×3 IMPLANT
HANDPIECE INTERPULSE COAX TIP (DISPOSABLE) ×3
HOOD PEEL AWAY FACE SHEILD DIS (HOOD) ×6 IMPLANT
IMMOBILIZER KNEE 22 (SOFTGOODS) ×2 IMPLANT
IMMOBILIZER KNEE 22 UNIV (SOFTGOODS) ×3 IMPLANT
KIT BASIN OR (CUSTOM PROCEDURE TRAY) ×3 IMPLANT
KIT ROOM TURNOVER OR (KITS) ×3 IMPLANT
MANIFOLD NEPTUNE II (INSTRUMENTS) ×3 IMPLANT
NS IRRIG 1000ML POUR BTL (IV SOLUTION) ×3 IMPLANT
PACK TOTAL JOINT (CUSTOM PROCEDURE TRAY) ×3 IMPLANT
PAD ABD 8X10 STRL (GAUZE/BANDAGES/DRESSINGS) ×2 IMPLANT
PAD ARMBOARD 7.5X6 YLW CONV (MISCELLANEOUS) ×6 IMPLANT
PAD CAST 4YDX4 CTTN HI CHSV (CAST SUPPLIES) ×1 IMPLANT
PADDING CAST COTTON 4X4 STRL (CAST SUPPLIES) ×3
PADDING CAST COTTON 6X4 STRL (CAST SUPPLIES) ×3 IMPLANT
SET HNDPC FAN SPRY TIP SCT (DISPOSABLE) ×1 IMPLANT
SPONGE GAUZE 4X4 12PLY (GAUZE/BANDAGES/DRESSINGS) ×3 IMPLANT
SPONGE GAUZE 4X4 12PLY STER LF (GAUZE/BANDAGES/DRESSINGS) ×2 IMPLANT
STAPLER VISISTAT 35W (STAPLE) ×3 IMPLANT
STRIP CLOSURE SKIN 1/2X4 (GAUZE/BANDAGES/DRESSINGS) ×1 IMPLANT
SUCTION FRAZIER TIP 10 FR DISP (SUCTIONS) ×3 IMPLANT
SUT MNCRL AB 4-0 PS2 18 (SUTURE) IMPLANT
SUT VIC AB 0 CT1 27 (SUTURE) ×3
SUT VIC AB 0 CT1 27XBRD ANBCTR (SUTURE) ×1 IMPLANT
SUT VIC AB 2-0 CT1 27 (SUTURE) ×3
SUT VIC AB 2-0 CT1 TAPERPNT 27 (SUTURE) ×1 IMPLANT
SUT VIC AB 3-0 SH 8-18 (SUTURE) ×6 IMPLANT
SYR 30ML LL (SYRINGE) ×3 IMPLANT
TOWEL OR 17X24 6PK STRL BLUE (TOWEL DISPOSABLE) ×3 IMPLANT
TOWEL OR 17X26 10 PK STRL BLUE (TOWEL DISPOSABLE) ×3 IMPLANT
TRAY FOLEY CATH 16FRSI W/METER (SET/KITS/TRAYS/PACK) IMPLANT
WATER STERILE IRR 1000ML POUR (IV SOLUTION) ×6 IMPLANT

## 2013-04-10 NOTE — Anesthesia Preprocedure Evaluation (Addendum)
Anesthesia Evaluation  Patient identified by MRN, date of birth, ID band Patient awake    Reviewed: Allergy & Precautions, H&P , NPO status , Patient's Chart, lab work & pertinent test results  Airway Mallampati: II TM Distance: >3 FB Neck ROM: Full    Dental  (+) Teeth Intact, Dental Advisory Given   Pulmonary          Cardiovascular Rhythm:Regular Rate:Normal     Neuro/Psych    GI/Hepatic   Endo/Other  diabetes  Renal/GU      Musculoskeletal   Abdominal   Peds  Hematology   Anesthesia Other Findings   Reproductive/Obstetrics                         Anesthesia Physical Anesthesia Plan  ASA: II  Anesthesia Plan: General and Regional   Post-op Pain Management:    Induction: Intravenous  Airway Management Planned: LMA  Additional Equipment:   Intra-op Plan:   Post-operative Plan: Extubation in OR  Informed Consent: I have reviewed the patients History and Physical, chart, labs and discussed the procedure including the risks, benefits and alternatives for the proposed anesthesia with the patient or authorized representative who has indicated his/her understanding and acceptance.   Dental advisory given  Plan Discussed with: CRNA and Anesthesiologist  Anesthesia Plan Comments:         Anesthesia Quick Evaluation

## 2013-04-10 NOTE — Evaluation (Signed)
Physical Therapy Evaluation Patient Details Name: Erika [name] MRN: [id number] DOB: 09-Aug-1955 Today's Date: 04/10/2013 Time: 4270-6237 PT Time Calculation (min): 13 min  PT Assessment / Plan / Recommendation History of Present Illness  Pt s/p R TKR  Clinical Impression  Pt is s/p R TKA resulting in the deficits listed below (see PT Problem List).  Pt will benefit from skilled PT to increase their independence and safety with mobility to allow discharge to the venue listed below.  Pt agreeable to take a few steps over to recliner POD #0.  Pt plans to d/c home with family assist.  Pt aware not to place anything under knee and maintain KI as well as perform ankle pumps often throughout the day.     PT Assessment  Patient needs continued PT services    Follow Up Recommendations  Home health PT    Does the patient have the potential to tolerate intense rehabilitation      Barriers to Discharge        Equipment Recommendations  None recommended by PT    Recommendations for Other Services     Frequency 7X/week    Precautions / Restrictions Precautions Precautions: Knee Required Braces or Orthoses: Knee Immobilizer - Right Knee Immobilizer - Right: Discontinue post op day 2;On except when in CPM Restrictions Other Position/Activity Restrictions: WBAT   Pertinent Vitals/Pain Min R knee pain with mobility, activity to tolerance      Mobility  Bed Mobility Overal bed mobility: Needs Assistance Bed Mobility: Supine to Sit Supine to sit: Min assist General bed mobility comments: verbal cues for technique, assist for R LE Transfers Overall transfer level: Needs assistance Equipment used: Rolling walker (2 wheeled) Transfers: Sit to/from Stand Sit to Stand: Min assist General transfer comment: verbal cues for hand placement and R LE forward Ambulation/Gait Ambulation/Gait assistance: Min assist Ambulation Distance (Feet): 2 Feet Assistive device: Rolling walker (2  wheeled) Gait Pattern/deviations: Step-to pattern;Antalgic General Gait Details: verbal cues for RW distance, sequence, step length, pt took a few steps forwards and then backward to recliner    Exercises     PT Diagnosis: Difficulty walking;Acute pain  PT Problem List: Decreased strength;Decreased range of motion;Decreased mobility;Decreased knowledge of precautions;Decreased knowledge of use of DME;Pain PT Treatment Interventions: Stair training;DME instruction;Gait training;Patient/family education;Functional mobility training;Therapeutic activities;Therapeutic exercise     PT Goals(Current goals can be found in the care plan section) Acute Rehab PT Goals Patient Stated Goal: return home with family PT Goal Formulation: With patient Time For Goal Achievement: 04/14/13  Visit Information  Last PT Received On: 04/10/13 Assistance Needed: +1 History of Present Illness: Pt s/p R TKR       Prior [place] expects to be discharged to:: Private residence Living Arrangements: Spouse/significant other Type of Home: House Home Access: Stairs to enter Technical brewer of Steps: 1 threshold Entrance Stairs-Rails: None Home Layout: One level Home Equipment: Environmental consultant - 2 wheels Prior Function Level of Independence: Independent Communication Communication: No difficulties    Cognition  Cognition Arousal/Alertness: Awake/alert Behavior During Therapy: WFL for tasks assessed/performed Overall Cognitive Status: Within Functional Limits for tasks assessed    Extremity/Trunk Assessment Lower Extremity Assessment Lower Extremity Assessment: RLE deficits/detail RLE Deficits / Details: poor quad contraction, maintained KI   Balance    End of Session PT - End of Session Equipment Utilized During Treatment: Gait belt;Right knee immobilizer Activity Tolerance: Patient tolerated treatment well Patient left: in chair;with call bell/phone within reach;with  family/visitor  present  GP     Erika [name],Erika [name] 04/10/2013, 12:41 PM Carmelia Bake, PT, DPT 04/10/2013 Pager: [phone]

## 2013-04-10 NOTE — H&P (Signed)
PREOPERATIVE H&P  Chief Complaint: DJD Right knee  HPI: Erika [name] is a 58 y.o. female who presents for preoperative history and physical with a diagnosis of DJD Right knee. Symptoms are rated as moderate to severe, and have been worsening.  This is significantly impairing activities of daily living.  She has elected for surgical management. She has failed injections, activity modification, anti-inflammatories, among others.  Past Medical History  Diagnosis Date  . Hyperlipidemia     takes Crestor nightly  . Diabetes mellitus without complication     takes Janumet daily  . Anxiety     takes Clonazepam daily as needed  . Seasonal allergies     takes Omnaris and Claritin daily as needed  . Pneumonia     many yrs ago  . History of bronchitis     last time about 77yrs ago  . Neuropathy     both feet  . Arthritis   . Joint pain   . Joint swelling   . Cramping of feet     takes Potassium daily  . Dysphagia   . History of colon polyps   . Urinary frequency   . Anemia     hx of-while having a period   . Cataract     bilateral  . Insomnia     takes Benadryl nightly   Past Surgical History  Procedure Laterality Date  . Dilation and curettage, diagnostic / therapeutic    . Hysteroscopy with resectoscope    . Closed reduction nasal fracture    . Tonsillectomy    . Bone spur removed Left   . Fibroids removed    . Colonoscopy     History   Social History  . Marital Status: Married    Spouse Name: N/A    Number of Children: N/A  . Years of Education: N/A   Social History Main Topics  . Smoking status: Never Smoker   . Smokeless tobacco: None  . Alcohol Use: Yes     Comment: glass of wine occasionally  . Drug Use: No  . Sexual Activity: Yes   Other Topics Concern  . None   Social History Narrative  . None   History reviewed. No pertinent family history. Allergies  Allergen Reactions  . Amoxicillin     HIVES  . Nsaids Nausea And Vomiting  . Other    Any cillin drug makes her break out in hives  . Tetanus Toxoids Other (See Comments)    Very high fever   Prior to Admission medications   Medication Sig Start Date End Date Taking? Authorizing Provider  ARTIFICIAL TEARS 0.1-0.3 % SOLN Place 2 drops into both eyes 3 (three) times daily.    Yes Historical Provider, MD  aspirin 325 MG EC tablet Take 325 mg by mouth daily.   Yes Historical Provider, MD  Calcium Carb-Cholecalciferol (CALCIUM 600+D3) 600-800 MG-UNIT TABS Take 1 tablet by mouth 2 (two) times daily.   Yes Historical Provider, MD  ciclesonide (OMNARIS) 50 MCG/ACT nasal spray Place 2 sprays into both nostrils daily as needed for allergies.   Yes Historical Provider, MD  clonazePAM (KLONOPIN) 0.5 MG tablet Take 0.5 mg by mouth 2 (two) times daily as needed for anxiety.   Yes Historical Provider, MD  diphenhydrAMINE (BENADRYL) 25 mg capsule Take 25-50 mg by mouth at bedtime.   Yes Historical Provider, MD  glucosamine-chondroitin 500-400 MG tablet Take 3 tablets by mouth daily.   Yes Historical Provider, MD  GLUCOSAMINE-CHONDROITIN PO  Take 1 tablet by mouth 2 (two) times daily.    Yes Historical Provider, MD  lisinopril (PRINIVIL,ZESTRIL) 2.5 MG tablet Take 2.5 mg by mouth every morning.    Yes Historical Provider, MD  loratadine (CLARITIN) 10 MG tablet Take 10 mg by mouth 2 (two) times daily as needed for allergies.   Yes Historical Provider, MD  Omega-3 Fatty Acids (FISH OIL PO) Take 1 tablet by mouth daily.   Yes Historical Provider, MD  Potassium Gluconate 550 MG TABS Take 2 tablets by mouth every evening.    Yes Historical Provider, MD  rosuvastatin (CRESTOR) 10 MG tablet Take 5 mg by mouth every evening.    Yes Historical Provider, MD  sitaGLIPtin-metformin (JANUMET) 50-500 MG per tablet Take 1 tablet by mouth 2 (two) times daily with a meal.   Yes Historical Provider, MD  traMADol (ULTRAM) 50 MG tablet Take 50 mg by mouth every 12 (twelve) hours as needed for moderate pain.      Historical Provider, MD     Positive ROS: All other systems have been reviewed and were otherwise negative with the exception of those mentioned in the HPI and as above.  Physical Exam: General: Alert, no acute distress Cardiovascular: No pedal edema Respiratory: No cyanosis, no use of accessory musculature GI: No organomegaly, abdomen is soft and non-tender Skin: No lesions in the area of chief complaint Neurologic: Sensation intact distally Psychiatric: Patient is competent for consent with normal mood and affect Lymphatic: No axillary or cervical lymphadenopathy  MUSCULOSKELETAL: Right knee has diffuse pain, positive effusion, range of motion from 0 to 125.  Assessment: DJD Right knee  Plan: Plan for Procedure(s): TOTAL KNEE ARTHROPLASTY  The risks benefits and alternatives were discussed with the patient including but not limited to the risks of nonoperative treatment, versus surgical intervention including infection, bleeding, nerve injury,  blood clots, cardiopulmonary complications, morbidity, mortality, among others, and they were willing to proceed. She has a penicillin allergy, and apparently broke out in hives on her extremities after she took amoxicillin last month. Therefore we will plan to use vancomycin preoperatively.  Johnny Bridge, MD Cell [phone]   04/10/2013 7:17 AM

## 2013-04-10 NOTE — Anesthesia Postprocedure Evaluation (Signed)
  Anesthesia Post-op Note  Patient: Erika [name]  Procedure(s) Performed: Procedure(s): TOTAL KNEE ARTHROPLASTY (Right)  Patient Location: PACU  Anesthesia Type:General and GA combined with regional for post-op pain  Level of Consciousness: awake, alert  and oriented  Airway and Oxygen Therapy: Patient Spontanous Breathing  Post-op Pain: mild  Post-op Assessment: Post-op Vital signs reviewed, Patient's Cardiovascular Status Stable, Respiratory Function Stable, Patent Airway and Pain level controlled  Post-op Vital Signs: stable  Complications: No apparent anesthesia complications

## 2013-04-10 NOTE — Progress Notes (Signed)
Utilization review completed.  

## 2013-04-10 NOTE — Op Note (Signed)
DATE OF SURGERY:  04/10/2013 TIME: 9:21 AM  PATIENT NAME:  Erika [name]   AGE: 58 y.o.    PRE-OPERATIVE DIAGNOSIS:  DJD Right knee  POST-OPERATIVE DIAGNOSIS:  Same  PROCEDURE:  Procedure(s): TOTAL KNEE ARTHROPLASTY   SURGEON:  Johnny Bridge, MD   ASSISTANT:  Joya Gaskins, OPA-C, present and scrubbed throughout the case, critical for assistance with exposure, retraction, instrumentation, and closure.   OPERATIVE IMPLANTS: Depuy PFC Sigma, Posterior Stabilized.  Femur size 3, Tibia size 3, Patella size 35 3-peg oval button, with a 10 mm polyethylene insert.   PREOPERATIVE INDICATIONS:  Erika [name] is a 58 y.o. year old female with end stage bone on bone degenerative arthritis of the knee who failed conservative treatment, including injections, antiinflammatories, activity modification, and assistive devices, and had significant impairment of their activities of daily living, and elected for Total Knee Arthroplasty.   The risks, benefits, and alternatives were discussed at length including but not limited to the risks of infection, bleeding, nerve injury, stiffness, blood clots, the need for revision surgery, cardiopulmonary complications, among others, and they were willing to proceed.   OPERATIVE DESCRIPTION:  The patient was brought to the operative room and placed in a supine position.  General anesthesia was administered.  IV antibiotics were given.  The lower extremity was prepped and draped in the usual sterile fashion.  Time out was performed.  The leg was elevated and exsanguinated and the tourniquet was inflated.  Anterior quadriceps tendon splitting approach was performed.  The patella was everted and osteophytes were removed.  The anterior horn of the medial and lateral meniscus was removed.   The distal femur was opened with the drill and the intramedullary distal femoral cutting jig was utilized, set at 5 degrees resecting 10 mm off the distal femur.  Care was taken  to protect the collateral ligaments.  Then the extramedullary tibial cutting jig was utilized making the appropriate cut using the anterior tibial crest as a reference building in appropriate posterior slope.  Care was taken during the cut to protect the medial and collateral ligaments.  The proximal tibia was removed along with the posterior horns of the menisci.  The PCL was sacrificed.    The extensor gap was measured and was approximately 40mm.  The tibial cut was extremely minimal.  The distal femoral sizing jig was applied, taking care to avoid notching.  She sized to between a size 3 and a size 4, and I used the size 4 cutting jig initially. Then the 4-in-1 cutting jig was applied and the anterior and posterior femur was cut, along with the chamfer cuts.  All posterior osteophytes were removed.  The flexion gap was then measured and was .symmetric, as I could not even get the 8 into the flexion gap. Therefore I went back and used the painless distal femoral cutting guide setting it off of the anterior flange, because the anterior cut was exactly flush with the distal femur, and I resected 3 more millimeters going to size 3 femur taking them off of the posterior condyles and the distal chamfer cut. Anterior chamfer did not remove any bone. The flexion gap was now symmetric, at 10 mm, compared to the extension gap of 10 mm.  I completed the distal femoral preparation using the appropriate jig to prepare the box.  The patella was then measured, and cut with the saw.    The proximal tibia sized and prepared accordingly with the reamer and the punch,  and then all components were trialed with the 27mm poly insert.  The knee was found to have excellent balance and full motion.    The above named components were then cemented into place and all excess cement was removed.  The real polyethylene implant was placed.  The knee was easily taken through a range of motion and the patella tracked well and the  knee irrigated copiously and the parapatellar and subcutaneous tissue closed with vicryl, and monocryl with steri strips for the skin.  The wounds were injected with marcaine, and dressed with sterile gauze and the tourniquet released and the patient was awakened and returned to the PACU in stable and satisfactory condition.  There were no complications.  Total tourniquet time was ~90 minutes.

## 2013-04-10 NOTE — Anesthesia Procedure Notes (Addendum)
Procedure Name: LMA Insertion Date/Time: 04/10/2013 7:40 AM Performed by: Storm Frisk ELLEN-ELIZABETH Pre-anesthesia Checklist: Patient identified, Timeout performed, Emergency Drugs available, Suction available and Patient being monitored Patient Re-evaluated:Patient Re-evaluated prior to inductionOxygen Delivery Method: Circle system utilized Preoxygenation: Pre-oxygenation with 100% oxygen Intubation Type: IV induction and Inhalational induction Ventilation: Mask ventilation without difficulty and Oral airway inserted - appropriate to patient size LMA: LMA inserted LMA Size: 4.0 Number of attempts: 2 Tube secured with: Tape Dental Injury: Teeth and Oropharynx as per pre-operative assessment  Comments: Pt.'s jaw was tight despite induction with propofol and unable to pass LMA on first attempt.  Masked with inhalational agent and able to insert LMA without difficulty.   Anesthesia Regional Block:  Adductor canal block  Pre-Anesthetic Checklist: ,, timeout performed, Correct Patient, Correct Site, Correct Laterality, Correct Procedure, Correct Position, site marked, Risks and benefits discussed,  Surgical consent,  Pre-op evaluation,  At surgeon's request and post-op pain management  Laterality: Right     Needles:  Injection technique: Single-shot  Needle Type: Echogenic Stimulator Needle     Needle Length: 9cm 9 cm Needle Gauge: 22 and 22 G    Additional Needles:  Procedures: ultrasound guided (picture in chart) Adductor canal block Narrative:  Start time: 04/10/2013 7:00 AM End time: 04/10/2013 7:05 AM Injection made incrementally with aspirations every 5 mL.  Performed by: Personally   Additional Notes: 30 cc 0.5% Marcaine 1:200 Epi with 8 mg decadron

## 2013-04-10 NOTE — Discharge Instructions (Signed)
Diet: As you were doing prior to hospitalization  ° °Shower:  May shower but keep the wounds dry, use an occlusive plastic wrap, NO SOAKING IN TUB.  If the bandage gets wet, change with a clean dry gauze. ° °Dressing:  You may change your dressing 3-5 days after surgery.  Then change the dressing daily with sterile gauze dressing.   ° °There are sticky tapes (steri-strips) on your wounds and all the stitches are absorbable.  Leave the steri-strips in place when changing your dressings, they will peel off with time, usually 2-3 weeks. ° °Activity:  Increase activity slowly as tolerated, but follow the weight bearing instructions below.  No lifting or driving for 6 weeks. ° °Weight Bearing:   As tolerated.   ° °To prevent constipation: you may use a stool softener such as - ° °Colace (over the counter) 100 mg by mouth twice a day  °Drink plenty of fluids (prune juice may be helpful) and high fiber foods °Miralax (over the counter) for constipation as needed.   ° °Itching:  If you experience itching with your medications, try taking only a single pain pill, or even half a pain pill at a time.  You may take up to 10 pain pills per day, and you can also use benadryl over the counter for itching or also to help with sleep.  ° °Precautions:  If you experience chest pain or shortness of breath - call 911 immediately for transfer to the hospital emergency department!! ° °If you develop a fever greater that 101 F, purulent drainage from wound, increased redness or drainage from wound, or calf pain -- Call the office at [phone]                                                °Follow- Up Appointment:  Please call for an appointment to be seen in 2 weeks [place] - [phone] ° ° ° ° ° °

## 2013-04-10 NOTE — Preoperative (Signed)
Beta Blockers   Reason not to administer Beta Blockers:Not Applicable 

## 2013-04-10 NOTE — Transfer of Care (Signed)
Immediate Anesthesia Transfer of Care Note  Patient: Erika [name]  Procedure(s) Performed: Procedure(s): TOTAL KNEE ARTHROPLASTY (Right)  Patient Location: PACU  Anesthesia Type:General  Level of Consciousness: awake and pateint uncooperative  Airway & Oxygen Therapy: Patient Spontanous Breathing and Patient connected to nasal cannula oxygen  Post-op Assessment: Report given to PACU RN and Post -op Vital signs reviewed and stable  Post vital signs: Reviewed and stable  Complications: No apparent anesthesia complications

## 2013-04-10 NOTE — Progress Notes (Signed)
Orthopedic Tech Progress Note Patient Details:  Erika [name] Jan 30, 1956 [id number]  Patient ID: [name], female   DOB: 09/18/1955, 58 y.o.   MRN: [id number]   [name] 04/10/2013, 12:27 PMTrapeze bar

## 2013-04-10 NOTE — Progress Notes (Signed)
Orthopedic Tech Progress Note Patient Details:  Erika [name] [birth year] [id number]  Ortho Devices Type of Ortho Device: Knee Immobilizer Ortho Device/Splint Interventions: Application   Cammer, Theodoro Parma 04/10/2013, 12:21 PM

## 2013-04-11 ENCOUNTER — Encounter (HOSPITAL_COMMUNITY): Payer: Self-pay | Admitting: Orthopedic Surgery

## 2013-04-11 LAB — CBC
HCT: 36.9 % (ref 36.0–46.0)
HEMOGLOBIN: 12.4 g/dL (ref 12.0–15.0)
MCH: 31.6 pg (ref 26.0–34.0)
MCHC: 33.6 g/dL (ref 30.0–36.0)
MCV: 93.9 fL (ref 78.0–100.0)
PLATELETS: 214 10*3/uL (ref 150–400)
RBC: 3.93 MIL/uL (ref 3.87–5.11)
RDW: 12.8 % (ref 11.5–15.5)
WBC: 12.6 10*3/uL — AB (ref 4.0–10.5)

## 2013-04-11 LAB — BASIC METABOLIC PANEL
BUN: 10 mg/dL (ref 6–23)
CHLORIDE: 104 meq/L (ref 96–112)
CO2: 24 meq/L (ref 19–32)
Calcium: 8.9 mg/dL (ref 8.4–10.5)
Creatinine, Ser: 0.53 mg/dL (ref 0.50–1.10)
GFR calc Af Amer: >90 mL/min (ref >90)
GFR calc non Af Amer: >90 mL/min (ref >90)
Glucose, Bld: 167 mg/dL — ABNORMAL HIGH (ref 70–99)
POTASSIUM: 4.5 meq/L (ref 3.7–5.3)
Sodium: 141 mEq/L (ref 137–147)

## 2013-04-11 LAB — GLUCOSE, CAPILLARY
GLUCOSE-CAPILLARY: 148 mg/dL — AB (ref 70–99)
Glucose-Capillary: 143 mg/dL — ABNORMAL HIGH (ref 70–99)

## 2013-04-11 NOTE — Evaluation (Signed)
Occupational Therapy Evaluation Patient Details Name: Erika [name] MRN: [id number] DOB: [birth year] Today's Date: 04/11/2013 Time: 0815-0903 OT Time Calculation (min): 48 min  OT Assessment / Plan / Recommendation History of present illness 58 yo female s/p Rt TKA with KI   Clinical Impression   Patient evaluated by Occupational Therapy with no further acute OT needs identified. All education has been completed and the patient has no further questions. See below for any follow-up Occupational Therapy or equipment needs. OT to sign off. Thank you for referral.      OT Assessment  Patient does not need any further OT services    Follow Up Recommendations  No OT follow up    Barriers to Discharge      Equipment Recommendations  3 in 1 bedside comode    Recommendations for Other Services    Frequency       Precautions / Restrictions Precautions Precautions: Knee Required Braces or Orthoses: Knee Immobilizer - Right Knee Immobilizer - Right: Discontinue once straight leg raise with < 10 degree lag (POD 2) Restrictions Weight Bearing Restrictions: No Other Position/Activity Restrictions: WBAT   Pertinent Vitals/Pain 0 out 10 without movement Static standing 5 out 10 RN notified for pain medication    ADL  Eating/Feeding: Modified independent Where Assessed - Eating/Feeding: Chair Grooming: Wash/dry hands;Wash/dry face;Teeth care;Simulated;Supervision/safety Where Assessed - Grooming: Unsupported standing Upper Body Bathing: Chest;Right arm;Left arm;Abdomen;Simulated;Supervision/safety Where Assessed - Upper Body Bathing: Supported sitting Upper Body Dressing: Supervision/safety Where Assessed - Upper Body Dressing: Unsupported sitting Lower Body Dressing: Supervision/safety Where Assessed - Lower Body Dressing: Unsupported sit to stand Toilet Transfer: Supervision/safety Toilet Transfer Method: Sit to Loss adjuster, chartered: Bedside commode Tub/Shower  Transfer: Minimal assistance Tub/Shower Transfer Method: Therapist, art: Shower seat without back Equipment Used: Gait belt;Rolling walker;Knee Immobilizer Transfers/Ambulation Related to ADLs: Pt ambulating with RW with heavy use of BIL UE to prevent weight on RT LE. pt educated on pushing RW verse leaning on RW with BIL UE.  ADL Comments: pt educated on dressing and able to do so without AE use. Pt completely dressed to start session. Pt ambulated to gym to complete shower transfer with family present. Pt and family educated on DME setup. Pt completed bed mobility and educated on using robe belt as leg lifter as needed. Pt ending session in chair with ice on knee. RN called at beginning and end of session for pain medication.    OT Diagnosis:    OT Problem List:   OT Treatment Interventions:     OT Goals(Current goals can be found in the care plan section) Acute Rehab OT Goals Patient Stated Goal: to return home today  Visit Information  Last OT Received On: 04/11/13 Assistance Needed: +1 History of Present Illness: 58 yo female s/p Rt TKA with KI       Prior [place] expects to be discharged to:: Private residence Living Arrangements: Spouse/significant other;Children Available Help at Discharge: Family Type of Home: House Home Access: Stairs to enter Technical brewer of Steps: 1 threshold Entrance Stairs-Rails: None Home Layout: One level Home Equipment: Environmental consultant - 2 wheels;Shower seat;Grab bars - tub/shower;Hand held shower head Prior Function Level of Independence: Independent Communication Communication: No difficulties Dominant Hand: Right         Vision/Perception Vision - History Baseline Vision: Wears glasses all the time Patient Visual Report: No change from baseline Vision - Assessment Eye Alignment: Within Functional Limits Vision Assessment: Vision  not tested Perception Perception:  Within Functional Limits Praxis Praxis: Intact   Cognition  Cognition Arousal/Alertness: Awake/alert Behavior During Therapy: WFL for tasks assessed/performed Overall Cognitive Status: Within Functional Limits for tasks assessed    Extremity/Trunk Assessment Upper Extremity Assessment Upper Extremity Assessment: Overall WFL for tasks assessed Lower Extremity Assessment Lower Extremity Assessment: Defer to PT evaluation Cervical / Trunk Assessment Cervical / Trunk Assessment: Normal     Mobility Bed Mobility Overal bed mobility: Needs Assistance Bed Mobility: Supine to Sit;Sit to Supine Supine to sit: Supervision Sit to supine: Min assist General bed mobility comments: pt using robe belt to help with hip flexion of RT LE and progressing into the bed. Pt with correct sequence but demonstrates RT LE weakness . Transfers Overall transfer level: Needs assistance Equipment used: Rolling walker (2 wheeled) Transfers: Sit to/from Stand Sit to Stand: Supervision     Exercise     Balance     End of Session OT - End of Session Activity Tolerance: Patient tolerated treatment well Patient left: in chair;with call bell/phone within reach;with family/visitor present Nurse Communication: Mobility status;Precautions  GO     Peri Maris 04/11/2013, 9:13 AM  Pager: [phone] -

## 2013-04-11 NOTE — Care Management Note (Signed)
  CARE MANAGEMENT NOTE 04/11/2013  Patient:  Erika [name], Erika [name]   Account Number:  [id number]  Date Initiated:  04/11/2013  Documentation initiated by:  Ricki Miller  Subjective/Objective Assessment:   58 yr old female s/p right total knee arthroplasty.     Action/Plan:   Case Manager spoke with patient concerning home health and DME needs at discharge. Choice offered. Referral called to [facility], [place].   Anticipated DC Date:  04/11/2013   Anticipated DC Plan:  [place] [facility]  CM consult      [place]   Choice offered to / List presented to:  C-1 Patient   DME arranged  3-N-1      DME agency  [place] arranged  [place].   Status of service:  Completed, signed off Medicare Important Message given?   (If response is "NO", the following Medicare IM given date fields will be blank) Date Medicare IM given:   Date Additional Medicare IM given:    Discharge Disposition:  [place]  Per UR Regulation:    If discussed at Long Length of Stay Meetings, dates discussed:    Comments:

## 2013-04-11 NOTE — Discharge Summary (Signed)
Physician Discharge Summary  Patient ID: Erika [name] MRN: [id number] DOB/AGE: [birth year] 58 y.o.  Admit date: 04/10/2013 Discharge date: 04/11/2013  Admission Diagnoses:  Osteoarthritis of right knee  Discharge Diagnoses:  Principal Problem:   Osteoarthritis of right knee Active Problems:   Knee osteoarthritis   Past Medical History  Diagnosis Date  . Hyperlipidemia     takes Crestor nightly "because I'm a diabetic"  . Anxiety     takes Clonazepam daily as needed  . Seasonal allergies     takes Omnaris and Claritin daily as needed  . Neuropathy     both feet  . Joint pain   . Joint swelling   . Cramping of feet     takes Potassium daily  . Dysphagia   . History of colon polyps   . Urinary frequency   . Cataract     bilateral  . Insomnia     takes Benadryl nightly  . Pneumonia 1960's  . Type II diabetes mellitus     takes Janumet daily  . Anemia     "just related to fibroids"  . Arthritis     "wrists; neck" (04/10/2013)  . Osteoarthritis of right knee 04/10/2013  . Basal cell carcinoma of nasal tip     Surgeries: Procedure(s): TOTAL KNEE ARTHROPLASTY on 04/10/2013   Consultants (if any):    Discharged Condition: Improved  Hospital Course: Erika [name] is an 58 y.o. female who was admitted 04/10/2013 with a diagnosis of Osteoarthritis of right knee and went to the operating room on 04/10/2013 and underwent the above named procedures.    She was given perioperative antibiotics:  Anti-infectives   Start     Dose/Rate Route Frequency Ordered Stop   04/10/13 1900  vancomycin (VANCOCIN) IVPB 1000 mg/200 mL premix     1,000 mg 200 mL/hr over 60 Minutes Intravenous Every 12 hours 04/10/13 1058 04/10/13 2030   04/10/13 0548  vancomycin (VANCOCIN) 1 GM/200ML IVPB    Comments:  Ardine Eng   : cabinet override      04/10/13 0548 04/10/13 0719   04/10/13 0547  clindamycin (CLEOCIN) 600 MG/50ML IVPB  Status:  Discontinued    Comments:  Ardine Eng   : cabinet  override      04/10/13 0547 04/10/13 0726   04/10/13 0545  clindamycin (CLEOCIN) IVPB 600 mg  Status:  Discontinued     600 mg 100 mL/hr over 30 Minutes Intravenous  Once 04/10/13 0544 04/10/13 1058   04/10/13 0545  vancomycin (VANCOCIN) IVPB 1000 mg/200 mL premix  Status:  Discontinued     1,000 mg 200 mL/hr over 60 Minutes Intravenous  Once 04/10/13 0544 04/10/13 1058    .  She was given sequential compression devices, early ambulation, and lovenox for DVT prophylaxis.  She benefited maximally from the hospital stay and there were no complications.    Recent vital signs:  Filed Vitals:   04/11/13 0600  BP: 115/56  Pulse: 80  Temp: 98.6 F (37 C)  Resp: 16    Recent laboratory studies:  Lab Results  Component Value Date   HGB 12.4 04/11/2013   HGB 14.1 04/10/2013   HGB 14.4 04/02/2013   Lab Results  Component Value Date   WBC 12.6* 04/11/2013   PLT 214 04/11/2013   Lab Results  Component Value Date   INR 0.96 04/02/2013   Lab Results  Component Value Date   NA 141 04/11/2013   K 4.5 04/11/2013   CL 104  04/11/2013   CO2 24 04/11/2013   BUN 10 04/11/2013   CREATININE 0.53 04/11/2013   GLUCOSE 167* 04/11/2013    Discharge Medications:     Medication List         ARTIFICIAL TEARS 0.1-0.3 % Soln  Place 2 drops into both eyes 3 (three) times daily.     aspirin 325 MG EC tablet  Take 325 mg by mouth daily.     CALCIUM 600+D3 600-800 MG-UNIT Tabs  Generic drug:  Calcium Carb-Cholecalciferol  Take 1 tablet by mouth 2 (two) times daily.     ciclesonide 50 MCG/ACT nasal spray  Commonly known as:  OMNARIS  Place 2 sprays into both nostrils daily as needed for allergies.     clonazePAM 0.5 MG tablet  Commonly known as:  KLONOPIN  Take 0.5 mg by mouth 2 (two) times daily as needed for anxiety.     diphenhydrAMINE 25 mg capsule  Commonly known as:  BENADRYL  Take 25-50 mg by mouth at bedtime.     enoxaparin 30 MG/0.3ML injection  Commonly known as:  LOVENOX  Inject 0.3 mLs  (30 mg total) into the skin every 12 (twelve) hours.     FISH OIL PO  Take 1 tablet by mouth daily.     glucosamine-chondroitin 500-400 MG tablet  Take 3 tablets by mouth daily.     GLUCOSAMINE-CHONDROITIN PO  Take 1 tablet by mouth 2 (two) times daily.     lisinopril 2.5 MG tablet  Commonly known as:  PRINIVIL,ZESTRIL  Take 2.5 mg by mouth every morning.     loratadine 10 MG tablet  Commonly known as:  CLARITIN  Take 10 mg by mouth 2 (two) times daily as needed for allergies.     methocarbamol 500 MG tablet  Commonly known as:  ROBAXIN  Take 1 tablet (500 mg total) by mouth 4 (four) times daily.     oxyCODONE-acetaminophen 10-325 MG per tablet  Commonly known as:  PERCOCET  Take 1-2 tablets by mouth every 6 (six) hours as needed for pain. MAXIMUM TOTAL ACETAMINOPHEN DOSE IS 4000 MG PER DAY     Potassium Gluconate 550 MG Tabs  Take 2 tablets by mouth every evening.     promethazine 25 MG tablet  Commonly known as:  PHENERGAN  Take 1 tablet (25 mg total) by mouth every 6 (six) hours as needed for nausea or vomiting.     rosuvastatin 10 MG tablet  Commonly known as:  CRESTOR  Take 5 mg by mouth every evening.     sennosides-docusate sodium 8.6-50 MG tablet  Commonly known as:  SENOKOT-S  Take 2 tablets by mouth daily.     sitaGLIPtin-metformin 50-500 MG per tablet  Commonly known as:  JANUMET  Take 1 tablet by mouth 2 (two) times daily with a meal.     traMADol 50 MG tablet  Commonly known as:  ULTRAM  Take 50 mg by mouth every 12 (twelve) hours as needed for moderate pain.        Diagnostic Studies: Dg Knee Right Port  04/10/2013   CLINICAL DATA:  Post arthroplasty.  EXAM: PORTABLE RIGHT KNEE - 1-2 VIEW  COMPARISON:  None.  FINDINGS: Exam demonstrates evidence of recent total knee arthroplasty with prosthetic components normally located and intact. Air fluid present within the knee joint. Remainder the exam is unremarkable.  IMPRESSION: Postsurgical changes  compatible with recent total knee arthroplasty. No complicating features.   Electronically Signed   By: Quillian Quince  Derrel Nip M.D.   On: 04/10/2013 10:06    Disposition: Final discharge disposition not confirmed      Discharge Orders   Future Orders Complete By Expires   Call MD / Call 911  As directed    Comments:     If you experience chest pain or shortness of breath, CALL 911 and be transported to the hospital emergency room.  If you develope a fever above 101 F, pus (white drainage) or increased drainage or redness at the wound, or calf pain, call your surgeon's office.   Change dressing  As directed    Comments:     Change dressing in three days, then change the dressing daily with sterile 4 x 4 inch gauze dressing.  You may clean the incision with alcohol prior to redressing.   Constipation Prevention  As directed    Comments:     Drink plenty of fluids.  Prune juice may be helpful.  You may use a stool softener, such as Colace (over the counter) 100 mg twice a day.  Use MiraLax (over the counter) for constipation as needed.   Diet general  As directed    Discharge instructions  As directed    Comments:     Change dressing in 3 days and reapply fresh dressing, unless you have a splint (half cast).  If you have a splint/cast, just leave in place until your follow-up appointment.    Keep wounds dry for 3 weeks.  Leave steri-strips in place on skin.  Do not apply lotion or anything to the wound.   Do not put a pillow under the knee. Place it under the heel.  As directed    TED hose  As directed    Comments:     Use stockings (TED hose) for 2 weeks on both leg(s).  You may remove them at night for sleeping.   Weight bearing as tolerated  As directed    Questions:     Laterality:     Extremity:        Follow-up Information   Follow up with Johnny Bridge, MD. Schedule an appointment as soon as possible for a visit in 2 weeks.   Specialty:  Orthopedic Surgery   Contact information:    [place] [phone]        Signed: Johnny Bridge 04/11/2013, 8:13 AM

## 2013-04-11 NOTE — Plan of Care (Signed)
Problem: Consults Goal: Diagnosis- Total Joint Replacement Outcome: Completed/Met Date Met:  04/11/13 Primary Total Knee Right

## 2013-04-11 NOTE — Progress Notes (Signed)
Physical Therapy Treatment Patient Details Name: Erika [name] MRN: [id number] DOB: May 19, 1955 Today's Date: 04/11/2013 Time: 9295-7473 PT Time Calculation (min): 30 min  PT Assessment / Plan / Recommendation  History of Present Illness 58 yo female s/p Rt TKA with KI   PT Comments   Pt progressing well towards goals. Amb distance up to 80 feet this AM. Completed stair training and is able to demonstrate single step navigation with Min guard safely. Correct technique with home exercises and is adequate for d/c to HHPT from a functional mobility standpoint.  Follow Up Recommendations  Home health PT           Equipment Recommendations  None recommended by PT    Recommendations for Other Services    Frequency 7X/week   Progress towards PT Goals Progress towards PT goals: Progressing toward goals  Plan Current plan remains appropriate    Precautions / Restrictions Precautions Precautions: Knee Required Braces or Orthoses: Knee Immobilizer - Right Knee Immobilizer - Right: Discontinue once straight leg raise with < 10 degree lag Restrictions Weight Bearing Restrictions: Yes Other Position/Activity Restrictions: WBAT   Pertinent Vitals/Pain Pt states 4/10 pain reporting she was just recently given medication Repositioned for comfort with towel under R ankle for optimal knee extension    Mobility  Bed Mobility Overal bed mobility: Needs Assistance Bed Mobility: Supine to Sit;Sit to Supine Supine to sit: Supervision Sit to supine: Min assist General bed mobility comments: pt using robe belt to help with hip flexion of RT LE and progressing into the bed. Pt with correct sequence but demonstrates RT LE weakness . Transfers Overall transfer level: Needs assistance Equipment used: Rolling walker (2 wheeled) Transfers: Sit to/from Stand Sit to Stand: Min guard General transfer comment: Verbal cues for hand and foot placement Ambulation/Gait Ambulation/Gait assistance: Min  guard Ambulation Distance (Feet): 80 Feet Assistive device: Rolling walker (2 wheeled) Gait Pattern/deviations: Step-through pattern;Antalgic General Gait Details: Training focused on step-through gait pattern, verbal cues for increasing WB as tolerated during stance phase. Stairs: Yes Stairs assistance: Min guard Stair Management: No rails;Backwards;Forwards;With walker Number of Stairs: 1 General stair comments: Pt safely negotiates one step both forward and backwards with RW. Verbal cues for foot placement closer to threshold before taking step    Exercises Total Joint Exercises Ankle Circles/Pumps: AROM;10 reps;Both;Seated Quad Sets: AROM;Both;10 reps;Seated (with legs elevated in recliner) Heel Slides: AAROM;5 reps;Right;Seated   PT Diagnosis:    PT Problem List:   PT Treatment Interventions:     PT Goals (current goals can now be found in the care plan section) Acute Rehab PT Goals Patient Stated Goal: to return home today PT Goal Formulation: With patient Time For Goal Achievement: 04/14/13  Visit Information  Last PT Received On: 04/11/13 Assistance Needed: +1 History of Present Illness: 58 yo female s/p Rt TKA with KI    Subjective Data  Subjective: Pt requests practicing single step to enter/exit house Patient Stated Goal: to return home today   Cognition  Cognition Arousal/Alertness: Awake/alert Behavior During Therapy: WFL for tasks assessed/performed Overall Cognitive Status: Within Functional Limits for tasks assessed    Balance     End of Session PT - End of Session Equipment Utilized During Treatment: Gait belt;Right knee immobilizer Activity Tolerance: Patient tolerated treatment well Patient left: in chair;with call bell/phone within reach;with family/visitor present Nurse Communication: Mobility status   GP    Playa Fortuna, [place]  Ellouise Newer 04/11/2013, 12:28 PM

## 2013-11-23 ENCOUNTER — Other Ambulatory Visit: Payer: Self-pay

## 2014-01-28 NOTE — Patient Instructions (Addendum)
   Your procedure is scheduled on: Wednesday, Dec 30  Enter through the [employer] [facility] at: 6 AM Pick up the phone at the desk and dial [phone] and inform us of your arrival.  Please call this number if you have any problems the morning of surgery: [phone]  Remember: Do not eat or drink after midnight: Tuesday Take these medicines the morning of surgery with a SIP OF WATER: zyrtec, lisinopril, klonopin.   Do not take any diabetes meds on Wednesday, day of surgery. We will check your sugar level upon arrival to Short Stay Dept.  Do not wear jewelry, make-up, or FINGER nail polish No metal in your hair or on your body. Do not wear lotions, powders, perfumes.  You may wear deodorant.  Do not bring valuables to the hospital. Contacts, dentures or bridgework may not be worn into surgery.  Leave suitcase in the car. After Surgery it may be brought to your room. For patients being admitted to the hospital, checkout time is 11:00am the day of discharge.  Home with husband Erika [name] cell [phone]

## 2014-01-29 ENCOUNTER — Encounter (HOSPITAL_COMMUNITY)
Admission: RE | Admit: 2014-01-29 | Discharge: 2014-01-29 | Disposition: A | Discharge status: Home / Self Care | Payer: 59 | Source: Ambulatory Visit | Attending: Obstetrics and Gynecology | Admitting: Obstetrics and Gynecology

## 2014-01-29 ENCOUNTER — Encounter (HOSPITAL_COMMUNITY): Payer: Self-pay

## 2014-01-29 DIAGNOSIS — Z01812 Encounter for preprocedural laboratory examination: Secondary | ICD-10-CM | POA: Insufficient documentation

## 2014-01-29 HISTORY — DX: Essential (primary) hypertension: I10

## 2014-01-29 HISTORY — DX: Myoneural disorder, unspecified: G70.9

## 2014-01-29 LAB — COMPREHENSIVE METABOLIC PANEL
ALK PHOS: 70 U/L (ref 39–117)
ALT: 22 U/L (ref 0–35)
AST: 20 U/L (ref 0–37)
Albumin: 3.9 g/dL (ref 3.5–5.2)
Anion gap: 6 (ref 5–15)
BILIRUBIN TOTAL: 0.7 mg/dL (ref 0.3–1.2)
BUN: 13 mg/dL (ref 6–23)
CHLORIDE: 105 meq/L (ref 96–112)
CO2: 28 mmol/L (ref 19–32)
Calcium: 9.5 mg/dL (ref 8.4–10.5)
Creatinine, Ser: 0.49 mg/dL — ABNORMAL LOW (ref 0.50–1.10)
GLUCOSE: 117 mg/dL — AB (ref 70–99)
POTASSIUM: 4.5 mmol/L (ref 3.5–5.1)
SODIUM: 139 mmol/L (ref 135–145)
Total Protein: 6.8 g/dL (ref 6.0–8.3)

## 2014-01-29 LAB — CBC
HCT: 41.4 % (ref 36.0–46.0)
HEMOGLOBIN: 13.9 g/dL (ref 12.0–15.0)
MCH: 31.6 pg (ref 26.0–34.0)
MCHC: 33.6 g/dL (ref 30.0–36.0)
MCV: 94.1 fL (ref 78.0–100.0)
PLATELETS: 207 10*3/uL (ref 150–400)
RBC: 4.4 MIL/uL (ref 3.87–5.11)
RDW: 13.1 % (ref 11.5–15.5)
WBC: 7.9 10*3/uL (ref 4.0–10.5)

## 2014-02-05 NOTE — H&P (Signed)
Erika [name] is an 58 y.o. female. She was seen in early December with c/o increasing vaginal bulge with pelvic pressure and discomfort.  On exam she had Gr II+ cystocele, Gr I rectocele.  Medical and surgical options were discussed, she wants to proceed with surgical therapy.  Pertinent Gynecological History: Last pap: normal Date: 04/2013 OB History: G1, P1001, SVD without complications   Menstrual History: No LMP recorded. Patient is postmenopausal.    Past Medical History  Diagnosis Date  . Hyperlipidemia     takes Crestor nightly "because I'm a diabetic"  . Anxiety     takes Clonazepam daily as needed  . Seasonal allergies     takes Omnaris and Claritin daily as needed  . Neuropathy     both feet  . Joint pain   . Joint swelling   . Cramping of feet     takes Potassium daily  . Dysphagia   . History of colon polyps   . Urinary frequency   . Cataract     bilateral  . Insomnia     takes Benadryl nightly  . Type II diabetes mellitus     takes Janumet daily  . Anemia     "just related to fibroids"  . Arthritis     "wrists; neck" (04/10/2013)  . Osteoarthritis of right knee 04/10/2013  . Basal cell carcinoma of nasal tip   . SVD (spontaneous vaginal delivery)     x 1  . Hypertension   . Neuromuscular disorder     feet    Past Surgical History  Procedure Laterality Date  . Hysteroscopy with resectoscope  2004    "w/fibroids removed"  . Closed reduction nasal fracture  ~ 2012  . Bone spurs Left 1970's    "ankle"  . Colonoscopy    . Total knee arthroplasty Right 04/10/2013  . Tonsillectomy  ~ 1959  . Melanoma excision  2014    "tip of nose"  . Total knee arthroplasty Right 04/10/2013    Procedure: TOTAL KNEE ARTHROPLASTY;  Surgeon: Johnny Bridge, MD;  Location: [facility];  Service: Orthopedics;  Laterality: Right;  . Joint replacement      right knee    No family history on file.  Social History:  reports that she has never smoked. She has never used smokeless  tobacco. She reports that she drinks about 1.8 oz of alcohol per week. She reports that she does not use illicit drugs.  Allergies:  Allergies  Allergen Reactions  . Amoxicillin     Only one case of HIVES.  Has taken it in the past.  . Nsaids Nausea And Vomiting  . Tetanus Toxoids Other (See Comments)    Very high fever    No prescriptions prior to admission    Review of Systems  Respiratory: Negative.   Cardiovascular: Negative.   Gastrointestinal: Negative.   Genitourinary: Negative.     There were no vitals taken for this visit. Physical Exam  Constitutional: She appears well-developed and well-nourished.  Cardiovascular: Normal rate, regular rhythm and normal heart sounds.   No murmur heard. Respiratory: Effort normal and breath sounds normal. No respiratory distress. She has no wheezes.  GI: Soft. She exhibits no distension and no mass. There is no tenderness.  Genitourinary:  EGBUS normal Vagina with Gr II+ cystocele, Gr I rectocele Uterus normal size fair support No adnexal mass    No results found for this or any previous visit (from the past 24 hour(s)).  No results found.  Assessment/Plan: Symptomatic cystocele with small rectocele.  Medical and surgical options were discussed, including physical therapy and use of a pessary.  She is not currently sexually actve, husband having issues, but she would like to maintain the ability to have coitus.  Surgical procedure, risks, chances of relieving symptoms, chance of recurrence, have all been discussed.  Will admit for for [facility], A&P repair.    Abriel Hattery D 02/05/2014, 7:17 PM

## 2014-02-05 NOTE — Anesthesia Preprocedure Evaluation (Addendum)
Anesthesia Evaluation  Patient identified by MRN, date of birth, ID band Patient awake    Reviewed: Allergy & Precautions, H&P , NPO status , Patient's Chart, lab work & pertinent test results  History of Anesthesia Complications Negative for: history of anesthetic complications  Airway Mallampati: III  TM Distance: >3 FB Neck ROM: Full  Mouth opening: Limited Mouth Opening  Dental no notable dental hx. (+) Dental Advisory Given   Pulmonary neg pulmonary ROS,  breath sounds clear to auscultation  Pulmonary exam normal       Cardiovascular Exercise Tolerance: Good hypertension, Rhythm:Regular Rate:Normal     Neuro/Psych negative neurological ROS  negative psych ROS   GI/Hepatic negative GI ROS, Neg liver ROS,   Endo/Other  diabetes, Type 2, Oral Hypoglycemic Agents  Renal/GU negative Renal ROS  negative genitourinary   Musculoskeletal  (+) Arthritis -, Osteoarthritis,    Abdominal   Peds negative pediatric ROS (+)  Hematology negative hematology ROS (+)   Anesthesia Other Findings Offered regional; R&B discussed Airway slightly narrowed Am BS 118  Reproductive/Obstetrics negative OB ROS                           Anesthesia Physical Anesthesia Plan  ASA: II  Anesthesia Plan: Spinal   Post-op Pain Management:    Induction: Intravenous  Airway Management Planned: Natural Airway  Additional Equipment:   Intra-op Plan:   Post-operative Plan: Extubation in OR  Informed Consent: I have reviewed the patients History and Physical, chart, labs and discussed the procedure including the risks, benefits and alternatives for the proposed anesthesia with the patient or authorized representative who has indicated his/her understanding and acceptance.   Dental advisory given  Plan Discussed with: CRNA  Anesthesia Plan Comments:        Anesthesia Quick Evaluation

## 2014-02-06 ENCOUNTER — Ambulatory Visit (HOSPITAL_COMMUNITY): Payer: 59 | Admitting: Anesthesiology

## 2014-02-06 ENCOUNTER — Encounter (HOSPITAL_COMMUNITY)
Admission: RE | Disposition: A | Discharge status: Home / Self Care | Payer: Self-pay | Source: Ambulatory Visit | Attending: Obstetrics and Gynecology

## 2014-02-06 ENCOUNTER — Ambulatory Visit (HOSPITAL_COMMUNITY)
Admission: RE | Admit: 2014-02-06 | Discharge: 2014-02-07 | Disposition: A | Discharge status: Home / Self Care | Payer: 59 | Source: Ambulatory Visit | Attending: Obstetrics and Gynecology | Admitting: Obstetrics and Gynecology

## 2014-02-06 ENCOUNTER — Encounter (HOSPITAL_COMMUNITY): Payer: Self-pay | Admitting: *Deleted

## 2014-02-06 DIAGNOSIS — Z880 Allergy status to penicillin: Secondary | ICD-10-CM | POA: Diagnosis not present

## 2014-02-06 DIAGNOSIS — D259 Leiomyoma of uterus, unspecified: Secondary | ICD-10-CM | POA: Diagnosis not present

## 2014-02-06 DIAGNOSIS — Z887 Allergy status to serum and vaccine status: Secondary | ICD-10-CM | POA: Diagnosis not present

## 2014-02-06 DIAGNOSIS — G629 Polyneuropathy, unspecified: Secondary | ICD-10-CM | POA: Insufficient documentation

## 2014-02-06 DIAGNOSIS — E119 Type 2 diabetes mellitus without complications: Secondary | ICD-10-CM | POA: Insufficient documentation

## 2014-02-06 DIAGNOSIS — E785 Hyperlipidemia, unspecified: Secondary | ICD-10-CM | POA: Diagnosis not present

## 2014-02-06 DIAGNOSIS — IMO0002 Reserved for concepts with insufficient information to code with codable children: Secondary | ICD-10-CM | POA: Diagnosis present

## 2014-02-06 DIAGNOSIS — F419 Anxiety disorder, unspecified: Secondary | ICD-10-CM | POA: Insufficient documentation

## 2014-02-06 DIAGNOSIS — N8110 Cystocele, unspecified: Secondary | ICD-10-CM | POA: Insufficient documentation

## 2014-02-06 DIAGNOSIS — Z886 Allergy status to analgesic agent status: Secondary | ICD-10-CM | POA: Insufficient documentation

## 2014-02-06 DIAGNOSIS — I10 Essential (primary) hypertension: Secondary | ICD-10-CM | POA: Diagnosis not present

## 2014-02-06 DIAGNOSIS — N816 Rectocele: Secondary | ICD-10-CM | POA: Insufficient documentation

## 2014-02-06 DIAGNOSIS — H269 Unspecified cataract: Secondary | ICD-10-CM | POA: Insufficient documentation

## 2014-02-06 HISTORY — PX: VAGINAL HYSTERECTOMY: SHX2639

## 2014-02-06 HISTORY — PX: ANTERIOR AND POSTERIOR REPAIR: SHX5121

## 2014-02-06 LAB — GLUCOSE, CAPILLARY
GLUCOSE-CAPILLARY: 118 mg/dL — AB (ref 70–99)
GLUCOSE-CAPILLARY: 206 mg/dL — AB (ref 70–99)
GLUCOSE-CAPILLARY: 215 mg/dL — AB (ref 70–99)
Glucose-Capillary: 162 mg/dL — ABNORMAL HIGH (ref 70–99)

## 2014-02-06 SURGERY — HYSTERECTOMY, VAGINAL
Anesthesia: Spinal | Site: Vagina

## 2014-02-06 MED ORDER — ONDANSETRON HCL 4 MG/2ML IJ SOLN
4.0000 mg | Freq: Four times a day (QID) | INTRAMUSCULAR | Status: DC | PRN
  Administered 2014-02-06 (×2): 4 mg via INTRAVENOUS
  Filled 2014-02-06 (×2): qty 2

## 2014-02-06 MED ORDER — ONDANSETRON HCL 4 MG/2ML IJ SOLN
INTRAMUSCULAR | Status: DC | PRN
  Administered 2014-02-06: 4 mg via INTRAVENOUS

## 2014-02-06 MED ORDER — CLONAZEPAM 0.5 MG PO TABS: 0.5000 mg | ORAL_TABLET | Freq: Two times a day (BID) | ORAL | Status: DC | PRN

## 2014-02-06 MED ORDER — LIDOCAINE HCL (CARDIAC) 20 MG/ML IV SOLN
INTRAVENOUS | Status: AC
  Filled 2014-02-06: qty 5

## 2014-02-06 MED ORDER — KETOROLAC TROMETHAMINE 30 MG/ML IJ SOLN: 30.0000 mg | Freq: Four times a day (QID) | INTRAMUSCULAR | Status: DC

## 2014-02-06 MED ORDER — MIDAZOLAM HCL 2 MG/2ML IJ SOLN
INTRAMUSCULAR | Status: DC | PRN
  Administered 2014-02-06 (×2): 1 mg via INTRAVENOUS

## 2014-02-06 MED ORDER — ESTRADIOL 0.1 MG/GM VA CREA
TOPICAL_CREAM | VAGINAL | Status: AC
  Filled 2014-02-06: qty 42.5

## 2014-02-06 MED ORDER — MENTHOL 3 MG MT LOZG: 1.0000 | LOZENGE | OROMUCOSAL | Status: DC | PRN

## 2014-02-06 MED ORDER — SITAGLIPTIN PHOS-METFORMIN HCL 50-500 MG PO TABS: 1.0000 | ORAL_TABLET | Freq: Two times a day (BID) | ORAL | Status: DC

## 2014-02-06 MED ORDER — ACETAMINOPHEN 160 MG/5ML PO SOLN: 325.0000 mg | ORAL | Status: DC | PRN

## 2014-02-06 MED ORDER — KETOROLAC TROMETHAMINE 30 MG/ML IJ SOLN
30.0000 mg | Freq: Four times a day (QID) | INTRAMUSCULAR | Status: DC
  Administered 2014-02-06: 30 mg via INTRAVENOUS
  Filled 2014-02-06: qty 1

## 2014-02-06 MED ORDER — DEXAMETHASONE SODIUM PHOSPHATE 4 MG/ML IJ SOLN
INTRAMUSCULAR | Status: AC
  Filled 2014-02-06: qty 1

## 2014-02-06 MED ORDER — ROCURONIUM BROMIDE 100 MG/10ML IV SOLN
INTRAVENOUS | Status: AC
  Filled 2014-02-06: qty 1

## 2014-02-06 MED ORDER — DEXTROSE-NACL 5-0.45 % IV SOLN
INTRAVENOUS | Status: DC
  Administered 2014-02-06 (×2): via INTRAVENOUS
  Administered 2014-02-07: 125 mL/h via INTRAVENOUS

## 2014-02-06 MED ORDER — VASOPRESSIN 20 UNIT/ML IV SOLN
INTRAVENOUS | Status: AC
Start: 2014-02-06 — End: 2014-02-06
  Filled 2014-02-06: qty 1

## 2014-02-06 MED ORDER — INSULIN ASPART 100 UNIT/ML ~~LOC~~ SOLN
0.0000 [IU] | Freq: Three times a day (TID) | SUBCUTANEOUS | Status: DC
  Administered 2014-02-06: 5 [IU] via SUBCUTANEOUS
  Administered 2014-02-07: 3 [IU] via SUBCUTANEOUS

## 2014-02-06 MED ORDER — FENTANYL CITRATE 0.05 MG/ML IJ SOLN
INTRAMUSCULAR | Status: AC
  Administered 2014-02-06: 50 ug via INTRAVENOUS
  Filled 2014-02-06: qty 2

## 2014-02-06 MED ORDER — PROPOFOL 10 MG/ML IV BOLUS
INTRAVENOUS | Status: DC | PRN
  Administered 2014-02-06: 20 mg via INTRAVENOUS
  Administered 2014-02-06: 10 mg via INTRAVENOUS
  Administered 2014-02-06: 20 mg via INTRAVENOUS
  Administered 2014-02-06 (×2): 10 mg via INTRAVENOUS
  Administered 2014-02-06 (×8): 20 mg via INTRAVENOUS
  Administered 2014-02-06 (×3): 10 mg via INTRAVENOUS
  Administered 2014-02-06 (×3): 20 mg via INTRAVENOUS
  Administered 2014-02-06: 30 mg via INTRAVENOUS
  Administered 2014-02-06 (×3): 20 mg via INTRAVENOUS
  Administered 2014-02-06 (×2): 30 mg via INTRAVENOUS
  Administered 2014-02-06: 10 mg via INTRAVENOUS

## 2014-02-06 MED ORDER — ESTRADIOL 0.1 MG/GM VA CREA
TOPICAL_CREAM | VAGINAL | Status: DC | PRN
  Administered 2014-02-06: 1 via VAGINAL

## 2014-02-06 MED ORDER — FENTANYL CITRATE 0.05 MG/ML IJ SOLN
25.0000 ug | INTRAMUSCULAR | Status: DC | PRN
  Administered 2014-02-06: 25 ug via INTRAVENOUS
  Administered 2014-02-06: 50 ug via INTRAVENOUS
  Administered 2014-02-06: 25 ug via INTRAVENOUS

## 2014-02-06 MED ORDER — BUPIVACAINE HCL (PF) 0.75 % IJ SOLN
INTRAMUSCULAR | Status: DC | PRN
  Administered 2014-02-06: 1.6 mL

## 2014-02-06 MED ORDER — PROPOFOL 10 MG/ML IV EMUL
INTRAVENOUS | Status: AC
  Filled 2014-02-06: qty 20

## 2014-02-06 MED ORDER — DEXAMETHASONE SODIUM PHOSPHATE 10 MG/ML IJ SOLN
INTRAMUSCULAR | Status: DC | PRN
  Administered 2014-02-06: 4 mg via INTRAVENOUS

## 2014-02-06 MED ORDER — INSULIN ASPART 100 UNIT/ML ~~LOC~~ SOLN
6.0000 [IU] | Freq: Once | SUBCUTANEOUS | Status: AC
  Administered 2014-02-06: 6 [IU] via SUBCUTANEOUS

## 2014-02-06 MED ORDER — LIDOCAINE HCL (CARDIAC) 20 MG/ML IV SOLN
INTRAVENOUS | Status: DC | PRN
  Administered 2014-02-06: 30 mg via INTRAVENOUS

## 2014-02-06 MED ORDER — SIMETHICONE 80 MG PO CHEW: 80.0000 mg | CHEWABLE_TABLET | Freq: Four times a day (QID) | ORAL | Status: DC | PRN

## 2014-02-06 MED ORDER — ONDANSETRON HCL 4 MG PO TABS: 4.0000 mg | ORAL_TABLET | Freq: Four times a day (QID) | ORAL | Status: DC | PRN

## 2014-02-06 MED ORDER — METFORMIN HCL 500 MG PO TABS
500.0000 mg | ORAL_TABLET | Freq: Two times a day (BID) | ORAL | Status: DC
  Administered 2014-02-07: 500 mg via ORAL
  Filled 2014-02-06 (×2): qty 1

## 2014-02-06 MED ORDER — LISINOPRIL 2.5 MG PO TABS
2.5000 mg | ORAL_TABLET | ORAL | Status: DC
  Administered 2014-02-07: 2.5 mg via ORAL
  Filled 2014-02-06: qty 1

## 2014-02-06 MED ORDER — NEOSTIGMINE METHYLSULFATE 10 MG/10ML IV SOLN
INTRAVENOUS | Status: AC
  Filled 2014-02-06: qty 1

## 2014-02-06 MED ORDER — KETOROLAC TROMETHAMINE 30 MG/ML IJ SOLN: 30.0000 mg | Freq: Once | INTRAMUSCULAR | Status: DC

## 2014-02-06 MED ORDER — ONDANSETRON HCL 4 MG/2ML IJ SOLN
INTRAMUSCULAR | Status: AC
  Filled 2014-02-06: qty 2

## 2014-02-06 MED ORDER — SCOPOLAMINE 1 MG/3DAYS TD PT72
1.0000 | MEDICATED_PATCH | Freq: Once | TRANSDERMAL | Status: DC
  Administered 2014-02-06: 1.5 mg via TRANSDERMAL

## 2014-02-06 MED ORDER — 0.9 % SODIUM CHLORIDE (POUR BTL) OPTIME
TOPICAL | Status: DC | PRN
  Administered 2014-02-06: 1000 mL

## 2014-02-06 MED ORDER — VASOPRESSIN 20 UNIT/ML IV SOLN
INTRAVENOUS | Status: DC | PRN
  Administered 2014-02-06: 17 mL via INTRAMUSCULAR

## 2014-02-06 MED ORDER — LACTATED RINGERS IV SOLN
INTRAVENOUS | Status: DC
  Administered 2014-02-06 (×3): via INTRAVENOUS

## 2014-02-06 MED ORDER — ALUM & MAG HYDROXIDE-SIMETH 200-200-20 MG/5ML PO SUSP: 30.0000 mL | ORAL | Status: DC | PRN

## 2014-02-06 MED ORDER — CEFAZOLIN SODIUM-DEXTROSE 2-3 GM-% IV SOLR
2.0000 g | INTRAVENOUS | Status: AC
  Administered 2014-02-06: 2 g via INTRAVENOUS

## 2014-02-06 MED ORDER — CEFAZOLIN SODIUM-DEXTROSE 2-3 GM-% IV SOLR
INTRAVENOUS | Status: AC
  Filled 2014-02-06: qty 50

## 2014-02-06 MED ORDER — HYDROMORPHONE HCL 1 MG/ML IJ SOLN
1.0000 mg | INTRAMUSCULAR | Status: DC | PRN
  Administered 2014-02-06: 1 mg via INTRAVENOUS
  Filled 2014-02-06: qty 1

## 2014-02-06 MED ORDER — SCOPOLAMINE 1 MG/3DAYS TD PT72
MEDICATED_PATCH | TRANSDERMAL | Status: AC
  Filled 2014-02-06: qty 1

## 2014-02-06 MED ORDER — MIDAZOLAM HCL 2 MG/2ML IJ SOLN
INTRAMUSCULAR | Status: AC
  Filled 2014-02-06: qty 2

## 2014-02-06 MED ORDER — SODIUM CHLORIDE 0.9 % IJ SOLN
INTRAMUSCULAR | Status: AC
  Filled 2014-02-06: qty 50

## 2014-02-06 MED ORDER — LINAGLIPTIN 5 MG PO TABS
5.0000 mg | ORAL_TABLET | Freq: Two times a day (BID) | ORAL | Status: DC
  Administered 2014-02-07: 5 mg via ORAL
  Filled 2014-02-06 (×2): qty 1

## 2014-02-06 MED ORDER — GLYCOPYRROLATE 0.2 MG/ML IJ SOLN
INTRAMUSCULAR | Status: AC
  Filled 2014-02-06: qty 3

## 2014-02-06 MED ORDER — LORATADINE 10 MG PO TABS
10.0000 mg | ORAL_TABLET | Freq: Every day | ORAL | Status: DC
  Administered 2014-02-07: 10 mg via ORAL
  Filled 2014-02-06: qty 1

## 2014-02-06 MED ORDER — FENTANYL CITRATE 0.05 MG/ML IJ SOLN
INTRAMUSCULAR | Status: AC
  Filled 2014-02-06: qty 5

## 2014-02-06 MED ORDER — ACETAMINOPHEN 325 MG PO TABS: 325.0000 mg | ORAL_TABLET | ORAL | Status: DC | PRN

## 2014-02-06 MED ORDER — FENTANYL CITRATE 0.05 MG/ML IJ SOLN
INTRAMUSCULAR | Status: DC | PRN
  Administered 2014-02-06 (×2): 50 ug via INTRAVENOUS

## 2014-02-06 MED ORDER — ONDANSETRON HCL 4 MG/2ML IJ SOLN: 4.0000 mg | Freq: Once | INTRAMUSCULAR | Status: DC | PRN

## 2014-02-06 MED ORDER — OXYCODONE-ACETAMINOPHEN 5-325 MG PO TABS
1.0000 | ORAL_TABLET | ORAL | Status: DC | PRN
  Administered 2014-02-06: 1 via ORAL
  Administered 2014-02-06: 2 via ORAL
  Administered 2014-02-07 (×2): 1 via ORAL
  Filled 2014-02-06 (×2): qty 1
  Filled 2014-02-06: qty 2
  Filled 2014-02-06 (×2): qty 1

## 2014-02-06 SURGICAL SUPPLY — 38 items
BLADE SURG 15 STRL LF C SS BP (BLADE) IMPLANT
BLADE SURG 15 STRL SS (BLADE)
CANISTER SUCT 3000ML (MISCELLANEOUS) ×3 IMPLANT
CATH ROBINSON RED A/P 16FR (CATHETERS) ×3 IMPLANT
CLOTH BEACON ORANGE TIMEOUT ST (SAFETY) ×3 IMPLANT
CONT PATH 16OZ SNAP LID 3702 (MISCELLANEOUS) IMPLANT
DECANTER SPIKE VIAL GLASS SM (MISCELLANEOUS) ×2 IMPLANT
DRAPE HYSTEROSCOPY (DRAPE) ×3 IMPLANT
GAUZE PACKING 2X5 YD STRL (GAUZE/BANDAGES/DRESSINGS) ×3 IMPLANT
GLOVE BIO SURGEON STRL SZ8 (GLOVE) ×6 IMPLANT
GLOVE BIOGEL PI IND STRL 6.5 (GLOVE) ×1 IMPLANT
GLOVE BIOGEL PI INDICATOR 6.5 (GLOVE) ×2
GLOVE ORTHO TXT STRL SZ7.5 (GLOVE) ×3 IMPLANT
GOWN STRL REUS W/TWL LRG LVL3 (GOWN DISPOSABLE) ×12 IMPLANT
NEEDLE HYPO 22GX1.5 SAFETY (NEEDLE) IMPLANT
NEEDLE MAYO .5 CIRCLE (NEEDLE) IMPLANT
NS IRRIG 1000ML POUR BTL (IV SOLUTION) ×3 IMPLANT
PACK VAGINAL WOMENS (CUSTOM PROCEDURE TRAY) ×3 IMPLANT
PAD OB MATERNITY 4.3X12.25 (PERSONAL CARE ITEMS) ×3 IMPLANT
SET CYSTO W/LG BORE CLAMP LF (SET/KITS/TRAYS/PACK) ×2 IMPLANT
SUT CHROMIC 1 TIES 18 (SUTURE) ×3 IMPLANT
SUT CHROMIC 1MO 4 18 CR8 (SUTURE) ×6 IMPLANT
SUT PROLENE 1 CTX 30  8455H (SUTURE)
SUT PROLENE 1 CTX 30 8455H (SUTURE) IMPLANT
SUT SILK 0 SH 30 (SUTURE) ×3 IMPLANT
SUT SILK 2 0 SH (SUTURE) ×3 IMPLANT
SUT VIC AB 2-0 CT1 (SUTURE) ×9 IMPLANT
SUT VIC AB 2-0 CT1 27 (SUTURE) ×3
SUT VIC AB 2-0 CT1 TAPERPNT 27 (SUTURE) ×1 IMPLANT
SUT VIC AB 2-0 CT2 27 (SUTURE) ×16 IMPLANT
SUT VIC AB 3-0 CT1 27 (SUTURE)
SUT VIC AB 3-0 CT1 TAPERPNT 27 (SUTURE) IMPLANT
SUT VIC AB 3-0 SH 27 (SUTURE)
SUT VIC AB 3-0 SH 27X BRD (SUTURE) IMPLANT
SUT VICRYL 0 UR6 27IN ABS (SUTURE) IMPLANT
TOWEL OR 17X24 6PK STRL BLUE (TOWEL DISPOSABLE) ×6 IMPLANT
TRAY FOLEY CATH 14FR (SET/KITS/TRAYS/PACK) ×3 IMPLANT
WATER STERILE IRR 1000ML POUR (IV SOLUTION) ×3 IMPLANT

## 2014-02-06 NOTE — Progress Notes (Signed)
Day of Surgery Procedure(s) (LRB): HYSTERECTOMY VAGINAL (N/A) ANTERIOR (CYSTOCELE) AND POSTERIOR REPAIR (RECTOCELE) (N/A)  Subjective: Patient actively vomiting into trash can when I arrived to round.  Has done this several times over the evening.  Pain not excrutiating, but building since her orals are not staying in too well. She also thinks she is sensitive to NSAIDs.   Objective: I have reviewed patient's vital signs and intake and output.  All else appears stable  General: alert and cooperative GI: soft NT  Assessment: s/p Procedure(s): HYSTERECTOMY VAGINAL (N/A) ANTERIOR (CYSTOCELE) AND POSTERIOR REPAIR (RECTOCELE) (N/A): not tolerating diet  Plan: Pt with N/V post-operatively and not tolerating po meds.  She thinks the IV toradol is making it worse and declines further doses  Will cover with some IV pain meds overnight and attempt po when N/V improves. D/c toradol per pt request Janumet and other diabetic meds on hold, covered by [employer].  Last BS 206   LOS: 0 days    Karn Derk W 02/06/2014, 6:36 PM

## 2014-02-06 NOTE — Transfer of Care (Signed)
Immediate Anesthesia Transfer of Care Note  Patient: Erika [name]  Procedure(s) Performed: Procedure(s): HYSTERECTOMY VAGINAL (N/A) ANTERIOR (CYSTOCELE) AND POSTERIOR REPAIR (RECTOCELE) (N/A)  Patient Location: PACU  Anesthesia Type:Spinal  Level of Consciousness: awake, alert , oriented and patient cooperative  Airway & Oxygen Therapy: Patient Spontanous Breathing and Patient connected to nasal cannula oxygen  Post-op Assessment: Report given to PACU RN and Post -op Vital signs reviewed and stable  Post vital signs: Reviewed and stable  Complications: No apparent anesthesia complications

## 2014-02-06 NOTE — Op Note (Signed)
Preoperative diagnosis: Symptomatic cystocele and small rectocele Postop diagnosis: ASame Procedure: Total vaginal hysterectomy, anterior and posterior repair Surgeon: Cheri Fowler M.D. Assistant: Newton Pigg MD. Anesthesia: Spinal Findings: Patient had a small uterus with normal tubes and ovaries. She had a Gr II-III cystocele, Gr I rectocele Estimated blood loss: 50 cc Specimens: Uterus for routine pathology Complications: None  Procedure in detail: The patient was taken to the operating room and placed in the sitting position. Dr. Jillyn Hidden instilled spinal anesthesia. She was then placed in the dorsosupine position and legs were elevated in stirrups. Perineum and vagina were then prepped and draped in the usual sterile fashion, bladder drained with red Robinson catheter. A Graves speculum was inserted in the vagina and the cervix was grasped with a Ardis Hughs tenaculum. Dilute Pitressin was then instilled at the cervicovaginal junction which was then incised circumferentially with electrocautery. Sharp dissection was then used to further free the vagina from the cervix. Anterior peritoneum was identified and entered sharply. A Deaver retractor was used to retract the bladder anteriorly. Posterior cul-de-sac was identified and entered sharply. A Bonnano speculum was placed into the posterior cul-de-sac. Uterosacral ligaments were clamped transected and ligated with #1 chromic and tagged for later use. Uterine arteries, cardinal ligaments and broad ligaments were also clamped transected and ligated with #1 chromic. Utero-ovarian pedicles were then clamped transected and doubly ligated with #1 chromic and tagged for inspection. Tubes and ovaries were inspected and found to be normal. The Bonnano speculum was removed and a shorter vaginal speculum was placed.  The uterosacral ligaments were then plicated in the midline with 2-0 silk and a suture of #1 Chromic was placed through the vagina and uterosacral  ligaments to be tied later.  The anterior vaginal cuff was grasped with 2 Allis clamps. The cystocele was then mobilized by dissecting anteriorly in the midline from the vaginal cuff to within about 2 cm of the urethral meatus in the midline. Cystocele was mobilized laterally. Cystocele was then reduced with several interrupted sutures of 2-0 Vicryl with adequate reduction. Excess vaginal tissue was removed and the incision was closed with running locking 2-0 Vicryl with adequate closure and adequate hemostasis.  The previously tagged uterosacral pedicles were also tied in the midline. No significant bleeding was identified. The vaginal cuff was then closed in a vertical fashion with running locking 2-0 Vicryl with adequate closure and adequate hemostasis.  The previously placed suture through the uterosacral ligaments was also tied.  Attention was now turned to the rectocele repair. The hymenal ring was grasped with 2 Allis clamps at a distance that when brought together would still allow two fingers to  easily pass into the vagina. A horizontal piece of tissue was removed. The posterior vaginal mucosa was then dissected in the midline from the hymenal ring to the vaginal apex. Rectocele was then mobilized bilaterally sharply and bluntly. The rectocele was then reduced with interrupted sutures of 2-0 Vicryl.  This achieved good reduction. A rectal exam confirmed good reduction of the rectocele. Excess vaginal mucosa was then removed sharply. Vagina was closed from the vaginal apex to the hymenal ring with running locking 2-0 Vicryl.  This achieved adequate closure and adequate hemostasis. The vagina was then packed with 2 inch gauze coated with Estrace cream. A Foley catheter was also placed. The patient tolerated the procedure well. She was taken to the recovery in stable condition. Counts were correct, she had PAS hose on throughout the procedure. She received Ancef 2 g for  surgical prophylaxis.

## 2014-02-06 NOTE — Anesthesia Postprocedure Evaluation (Signed)
  Anesthesia Post-op Note  Patient: Erika [name]  Procedure(s) Performed: Procedure(s): HYSTERECTOMY VAGINAL (N/A) ANTERIOR (CYSTOCELE) AND POSTERIOR REPAIR (RECTOCELE) (N/A)  Patient Location: [facility]  Anesthesia Type:General  Level of Consciousness: awake, alert , oriented and patient cooperative  Airway and Oxygen Therapy: Patient Spontanous Breathing  Post-op Pain: moderate  Post-op Assessment: Patient's Cardiovascular Status Stable and Respiratory Function Stable  Post-op Vital Signs: stable  Last Vitals:  Filed Vitals:   02/06/14 1429  BP: 174/75  Pulse: 71  Temp: 36.6 C  Resp: 18    Complications: No apparent anesthesia complications

## 2014-02-06 NOTE — Addendum Note (Signed)
Addendum  created 02/06/14 1443 by Georgeanne Nim, CRNA   Modules edited: Notes Section   Notes Section:  File: [id number]

## 2014-02-06 NOTE — Anesthesia Postprocedure Evaluation (Signed)
  Anesthesia Post-op Note  Patient: Erika [name]  Procedure(s) Performed: Procedure(s) (LRB): HYSTERECTOMY VAGINAL (N/A) ANTERIOR (CYSTOCELE) AND POSTERIOR REPAIR (RECTOCELE) (N/A)  Patient Location: PACU  Anesthesia Type: spinal  Level of Consciousness: awake and alert   Airway and Oxygen Therapy: Patient Spontanous Breathing  Post-op Pain: mild  Post-op Assessment: Post-op Vital signs reviewed, Patient's Cardiovascular Status Stable, Respiratory Function Stable, Patent Airway and No signs of Nausea or vomiting  Last Vitals:  Filed Vitals:   02/06/14 0915  BP: 140/77  Pulse: 64  Temp:   Resp: 21    Post-op Vital Signs: stable   Complications: No apparent anesthesia complications

## 2014-02-06 NOTE — Interval H&P Note (Signed)
History and Physical Interval Note:  02/06/2014 7:11 AM  Erika [name]  has presented today for surgery, with the diagnosis of Cystocele/Rectocele, [phone], [phone]  The various methods of treatment have been discussed with the patient and family. After consideration of risks, benefits and other options for treatment, the patient has consented to  Procedure(s): HYSTERECTOMY VAGINAL (N/A) ANTERIOR (CYSTOCELE) AND POSTERIOR REPAIR (RECTOCELE) (N/A) as a surgical intervention .  The patient's history has been reviewed, patient examined, no change in status, stable for surgery.  I have reviewed the patient's chart and labs.  Questions were answered to the patient's satisfaction.     Kashmir Leedy D

## 2014-02-06 NOTE — Anesthesia Procedure Notes (Signed)
Spinal Patient location during procedure: OR Start time: 02/06/2014 7:30 AM End time: 02/06/2014 7:35 AM Staffing Anesthesiologist: Milana Obey Performed by: anesthesiologist  Preanesthetic Checklist Completed: patient identified, site marked, surgical consent, pre-op evaluation, timeout performed, IV checked, risks and benefits discussed and monitors and equipment checked Spinal Block Patient position: sitting Prep: Betadine Patient monitoring: heart rate, continuous pulse ox and blood pressure Location: L3-4 Injection technique: single-shot Needle Needle type: Sprotte  Needle gauge: 24 G Needle length: 9 cm Assessment Sensory level: T10 Additional Notes Expiration date of kit checked and confirmed. Patient tolerated procedure well, without complications.

## 2014-02-07 ENCOUNTER — Encounter (HOSPITAL_COMMUNITY): Payer: Self-pay | Admitting: Obstetrics and Gynecology

## 2014-02-07 DIAGNOSIS — N8110 Cystocele, unspecified: Secondary | ICD-10-CM | POA: Diagnosis not present

## 2014-02-07 LAB — CBC
HCT: 36 % (ref 36.0–46.0)
Hemoglobin: 12.3 g/dL (ref 12.0–15.0)
MCH: 31.7 pg (ref 26.0–34.0)
MCHC: 34.2 g/dL (ref 30.0–36.0)
MCV: 92.8 fL (ref 78.0–100.0)
Platelets: 180 10*3/uL (ref 150–400)
RBC: 3.88 MIL/uL (ref 3.87–5.11)
RDW: 13.1 % (ref 11.5–15.5)
WBC: 11.7 10*3/uL — ABNORMAL HIGH (ref 4.0–10.5)

## 2014-02-07 LAB — GLUCOSE, CAPILLARY: Glucose-Capillary: 160 mg/dL — ABNORMAL HIGH (ref 70–99)

## 2014-02-07 MED ORDER — OXYCODONE-ACETAMINOPHEN 5-325 MG PO TABS: 1.0000 | ORAL_TABLET | ORAL | Status: DC | PRN

## 2014-02-07 NOTE — Progress Notes (Signed)
Discharge instructions reviewed with patient.  Patient states understanding of home care, medications, activity, signs/symptoms to report to MD and return MD office visit.  Patients significant other and family will assist with her care @ home.  No home equipment needed.  Prescriptions given to patient and patient has all personal belongings.  Patient ambulated for discharge in stable condition with staff without incident.

## 2014-02-07 NOTE — Progress Notes (Signed)
1 Day Post-Op Procedure(s) (LRB): HYSTERECTOMY VAGINAL (N/A) ANTERIOR (CYSTOCELE) AND POSTERIOR REPAIR (RECTOCELE) (N/A)  Subjective: Patient reports tolerating PO today  Objective: I have reviewed patient's vital signs, intake and output and labs.  General: alert GI: soft  Assessment: s/p Procedure(s): HYSTERECTOMY VAGINAL (N/A) ANTERIOR (CYSTOCELE) AND POSTERIOR REPAIR (RECTOCELE) (N/A): stable, progressing well and tolerating diet better today  Plan: Advance diet Encourage ambulation Advance to PO medication Discontinue IV fluids Discharge home if able to void  LOS: 1 day    Erika [name] D 02/07/2014, 9:19 AM

## 2014-02-07 NOTE — Discharge Instructions (Signed)
Routine instructions for vaginal hysterectomy, A&P repair

## 2014-02-07 NOTE — Progress Notes (Signed)
Vaginal packing removed with moderate dark, red vaginal bleeding.  Patient tolerated without difficulty.

## 2014-02-08 NOTE — Discharge Summary (Signed)
Physician Discharge Summary  Patient ID: Erika [name] MRN: [id number] DOB/AGE: Aug 01, 1955 59 y.o.  Admit date: 02/06/2014 Discharge date: 02/07/2014  Admission Diagnoses:  Cystocele, rectocele  Discharge Diagnoses:  Same Active Problems:   Cystocele   Discharged Condition: good  Hospital Course: Pt underwent TVH, A&P repair under spinal anesthesia without any complications.  Foley and vaginal packing removed POD #1, no problems, able to void and stable for discharge home.   Discharge Exam: Blood pressure 126/42, pulse 67, temperature 98.1 F (36.7 C), temperature source Axillary, resp. rate 18, height 5\' 4"  (1.626 m), weight 86.183 kg (190 lb), SpO2 100 %. General appearance: alert  Disposition: 01-Home or Self Care  Discharge Instructions    Diet - low sodium heart healthy    Complete by:  As directed      Increase activity slowly    Complete by:  As directed      Lifting restrictions    Complete by:  As directed   10 lbs     Sexual Activity Restrictions    Complete by:  As directed   Pelvic rest            Medication List    TAKE these medications        ARTIFICIAL TEARS 0.1-0.3 % Soln  Place 2 drops into both eyes 3 (three) times daily.     aspirin 325 MG EC tablet  Take 325 mg by mouth daily.     CALCIUM 600+D3 600-800 MG-UNIT Tabs  Generic drug:  Calcium Carb-Cholecalciferol  Take 1 tablet by mouth 2 (two) times daily.     cetirizine 10 MG tablet  Commonly known as:  ZYRTEC  Take 10 mg by mouth daily.     clonazePAM 0.5 MG tablet  Commonly known as:  KLONOPIN  Take 0.5 mg by mouth 2 (two) times daily as needed for anxiety.     diphenhydrAMINE 25 mg capsule  Commonly known as:  BENADRYL  Take 25-50 mg by mouth at bedtime.     FISH OIL PO  Take 1 tablet by mouth daily.     glucosamine-chondroitin 500-400 MG tablet  Take 1 tablet by mouth daily.     lisinopril 2.5 MG tablet  Commonly known as:  PRINIVIL,ZESTRIL  Take 2.5 mg by mouth every  morning.     Melatonin 5 MG Tabs  Take 1 tablet by mouth at bedtime as needed.     oxyCODONE-acetaminophen 5-325 MG per tablet  Commonly known as:  PERCOCET/ROXICET  Take 1-2 tablets by mouth every 4 (four) hours as needed for severe pain (moderate to severe pain (when tolerating fluids)).     Potassium Gluconate 550 MG Tabs  Take 2 tablets by mouth every evening.     rosuvastatin 10 MG tablet  Commonly known as:  CRESTOR  Take 5 mg by mouth every evening.     sitaGLIPtin-metformin 50-500 MG per tablet  Commonly known as:  JANUMET  Take 1 tablet by mouth 2 (two) times daily with a meal.           Follow-up Information    Follow up with Markell Schrier D, MD. Schedule an appointment as soon as possible for a visit in 2 weeks.   Specialty:  Obstetrics and Gynecology   Contact information:   [address] [place] [address] [phone]       Signed: Clarene Duke 02/08/2014, 10:18 AM

## 2014-03-08 ENCOUNTER — Other Ambulatory Visit (HOSPITAL_COMMUNITY): Payer: Self-pay | Admitting: Family Medicine

## 2014-03-08 DIAGNOSIS — Z1231 Encounter for screening mammogram for malignant neoplasm of breast: Secondary | ICD-10-CM

## 2014-03-14 ENCOUNTER — Ambulatory Visit (HOSPITAL_COMMUNITY)
Admission: RE | Admit: 2014-03-14 | Discharge: 2014-03-14 | Disposition: A | Discharge status: Home / Self Care | Payer: 59 | Source: Ambulatory Visit | Attending: Family Medicine | Admitting: Family Medicine

## 2014-03-14 DIAGNOSIS — Z1231 Encounter for screening mammogram for malignant neoplasm of breast: Secondary | ICD-10-CM | POA: Diagnosis present

## 2014-07-11 ENCOUNTER — Other Ambulatory Visit: Payer: Self-pay | Admitting: Gastroenterology

## 2014-08-05 ENCOUNTER — Other Ambulatory Visit: Payer: Self-pay

## 2015-03-07 IMAGING — DX DG KNEE 1-2V PORT*R*
2 series · 2 of 2 positions shown · non-contrast
Comparison: None.

CLINICAL DATA: Post arthroplasty.

EXAM:
PORTABLE RIGHT KNEE - 1-2 VIEW

[ap]
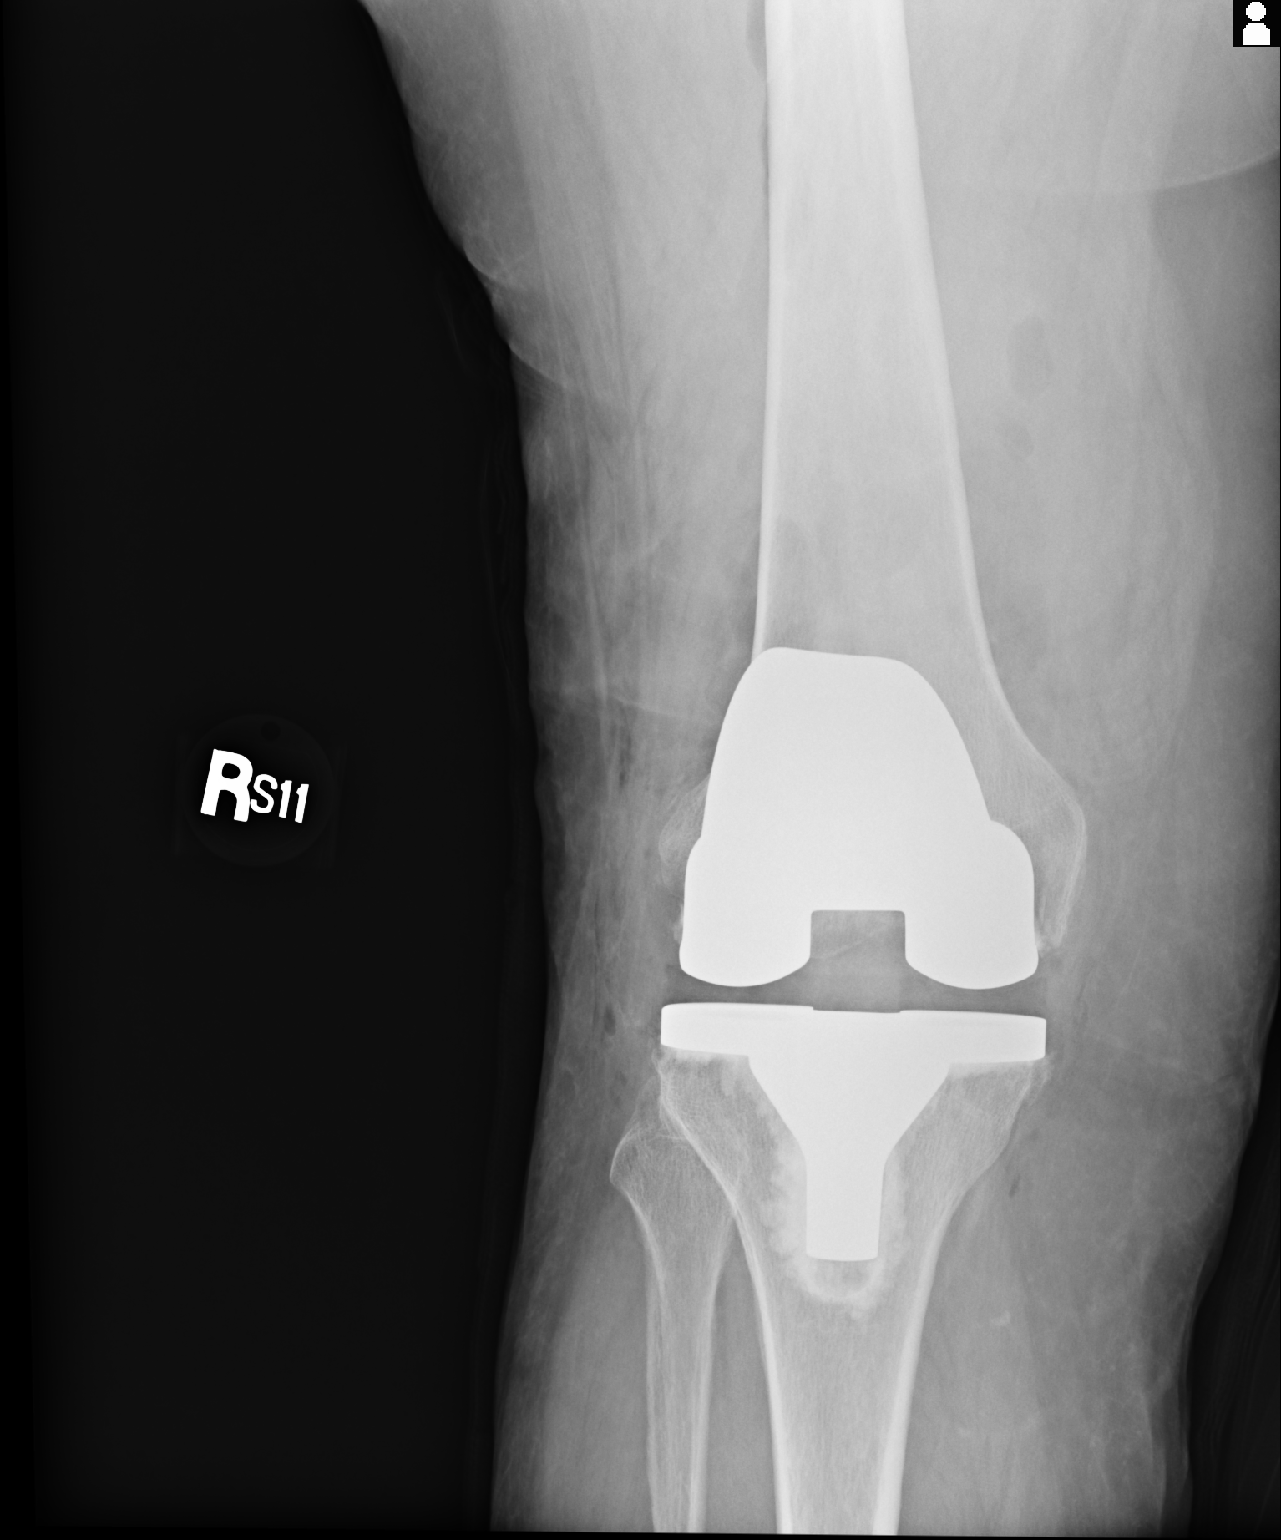

[lat]
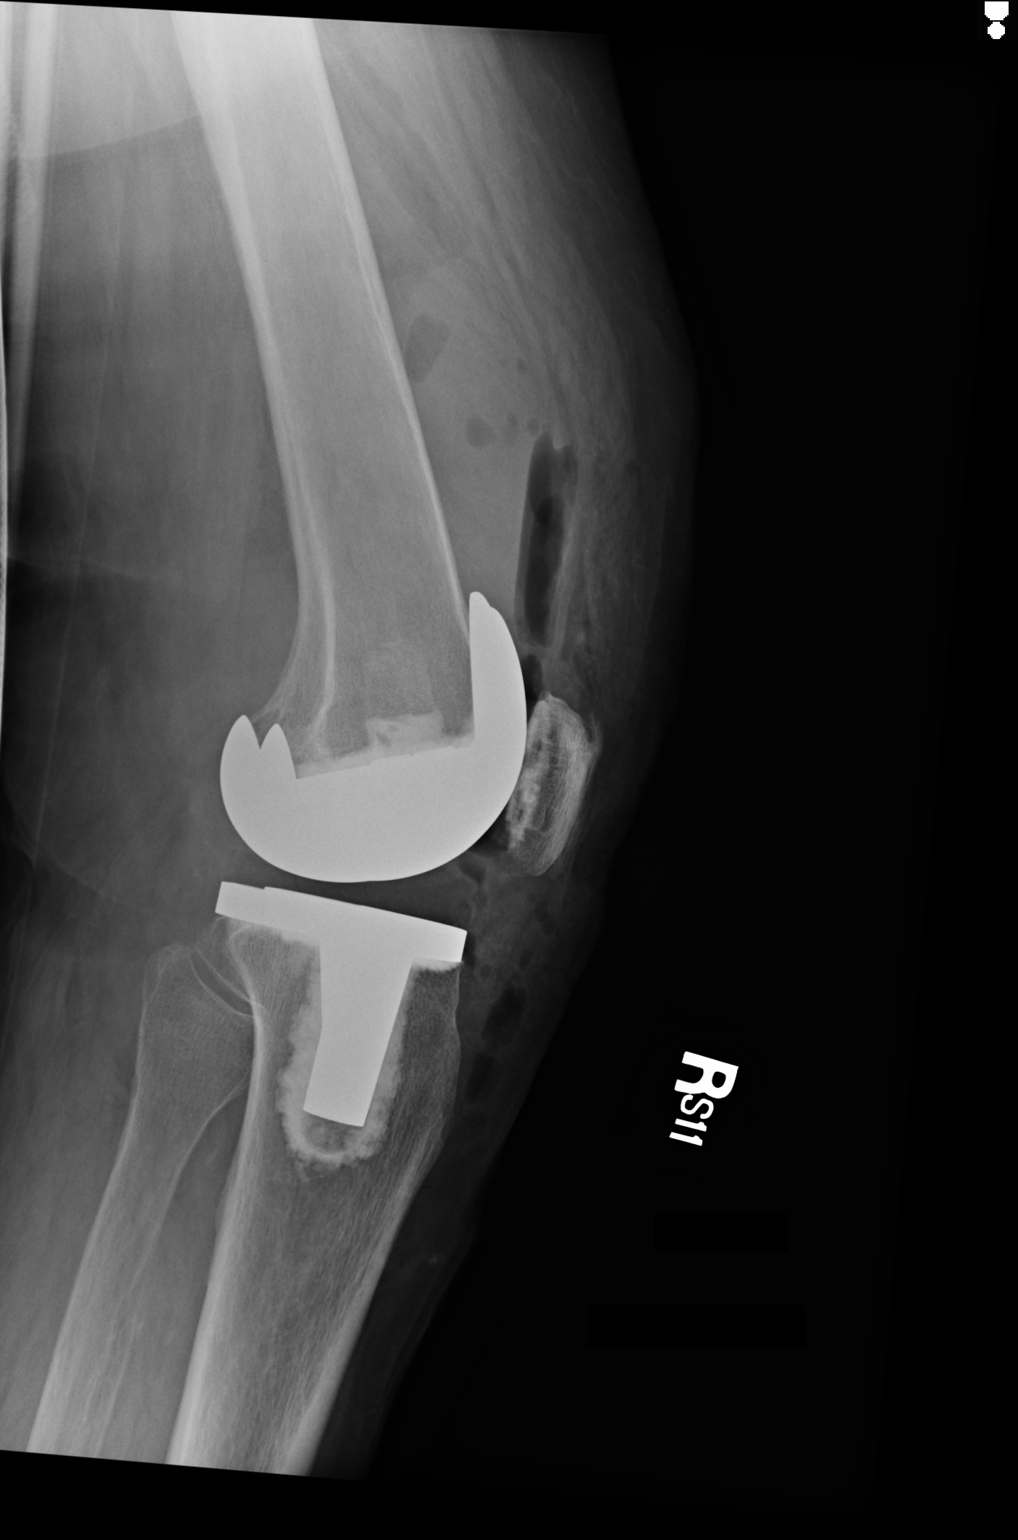

[2 of 2 positions shown; findings below may reference images not displayed]

FINDINGS: Exam demonstrates evidence of recent total knee arthroplasty with
prosthetic components normally located and intact. Air fluid present
within the knee joint. Remainder the exam is unremarkable.
IMPRESSION: Postsurgical changes compatible with recent total knee arthroplasty.
No complicating features.

## 2016-03-29 DIAGNOSIS — E1140 Type 2 diabetes mellitus with diabetic neuropathy, unspecified: Secondary | ICD-10-CM | POA: Diagnosis not present

## 2016-03-29 DIAGNOSIS — E559 Vitamin D deficiency, unspecified: Secondary | ICD-10-CM | POA: Diagnosis not present

## 2016-03-29 DIAGNOSIS — Z7984 Long term (current) use of oral hypoglycemic drugs: Secondary | ICD-10-CM | POA: Diagnosis not present

## 2016-03-29 DIAGNOSIS — E782 Mixed hyperlipidemia: Secondary | ICD-10-CM | POA: Diagnosis not present

## 2016-03-31 DIAGNOSIS — L820 Inflamed seborrheic keratosis: Secondary | ICD-10-CM | POA: Diagnosis not present

## 2016-04-02 DIAGNOSIS — L821 Other seborrheic keratosis: Secondary | ICD-10-CM | POA: Diagnosis not present

## 2016-04-02 DIAGNOSIS — D2262 Melanocytic nevi of left upper limb, including shoulder: Secondary | ICD-10-CM | POA: Diagnosis not present

## 2016-04-02 DIAGNOSIS — D225 Melanocytic nevi of trunk: Secondary | ICD-10-CM | POA: Diagnosis not present

## 2016-04-28 DIAGNOSIS — G4733 Obstructive sleep apnea (adult) (pediatric): Secondary | ICD-10-CM | POA: Diagnosis not present

## 2016-04-29 DIAGNOSIS — E119 Type 2 diabetes mellitus without complications: Secondary | ICD-10-CM | POA: Diagnosis not present

## 2016-04-29 DIAGNOSIS — Z961 Presence of intraocular lens: Secondary | ICD-10-CM | POA: Diagnosis not present

## 2016-06-09 DIAGNOSIS — G4733 Obstructive sleep apnea (adult) (pediatric): Secondary | ICD-10-CM | POA: Diagnosis not present

## 2016-07-10 DIAGNOSIS — G4733 Obstructive sleep apnea (adult) (pediatric): Secondary | ICD-10-CM | POA: Diagnosis not present

## 2016-08-09 DIAGNOSIS — G4733 Obstructive sleep apnea (adult) (pediatric): Secondary | ICD-10-CM | POA: Diagnosis not present

## 2016-08-31 DIAGNOSIS — G4733 Obstructive sleep apnea (adult) (pediatric): Secondary | ICD-10-CM | POA: Diagnosis not present

## 2016-09-09 DIAGNOSIS — G4733 Obstructive sleep apnea (adult) (pediatric): Secondary | ICD-10-CM | POA: Diagnosis not present

## 2016-09-28 DIAGNOSIS — Z0000 Encounter for general adult medical examination without abnormal findings: Secondary | ICD-10-CM | POA: Diagnosis not present

## 2016-09-28 DIAGNOSIS — E782 Mixed hyperlipidemia: Secondary | ICD-10-CM | POA: Diagnosis not present

## 2016-09-28 DIAGNOSIS — Z23 Encounter for immunization: Secondary | ICD-10-CM | POA: Diagnosis not present

## 2016-09-29 DIAGNOSIS — Z1211 Encounter for screening for malignant neoplasm of colon: Secondary | ICD-10-CM | POA: Diagnosis not present

## 2016-10-10 DIAGNOSIS — G4733 Obstructive sleep apnea (adult) (pediatric): Secondary | ICD-10-CM | POA: Diagnosis not present

## 2016-11-09 DIAGNOSIS — G4733 Obstructive sleep apnea (adult) (pediatric): Secondary | ICD-10-CM | POA: Diagnosis not present

## 2016-12-10 DIAGNOSIS — G4733 Obstructive sleep apnea (adult) (pediatric): Secondary | ICD-10-CM | POA: Diagnosis not present

## 2016-12-28 DIAGNOSIS — G4733 Obstructive sleep apnea (adult) (pediatric): Secondary | ICD-10-CM | POA: Diagnosis not present

## 2017-01-07 DIAGNOSIS — J0100 Acute maxillary sinusitis, unspecified: Secondary | ICD-10-CM | POA: Diagnosis not present

## 2017-01-09 DIAGNOSIS — G4733 Obstructive sleep apnea (adult) (pediatric): Secondary | ICD-10-CM | POA: Diagnosis not present

## 2017-02-09 DIAGNOSIS — G4733 Obstructive sleep apnea (adult) (pediatric): Secondary | ICD-10-CM | POA: Diagnosis not present

## 2017-03-12 DIAGNOSIS — G4733 Obstructive sleep apnea (adult) (pediatric): Secondary | ICD-10-CM | POA: Diagnosis not present

## 2017-04-01 DIAGNOSIS — E1140 Type 2 diabetes mellitus with diabetic neuropathy, unspecified: Secondary | ICD-10-CM | POA: Diagnosis not present

## 2017-04-01 DIAGNOSIS — E782 Mixed hyperlipidemia: Secondary | ICD-10-CM | POA: Diagnosis not present

## 2017-04-05 DIAGNOSIS — G4733 Obstructive sleep apnea (adult) (pediatric): Secondary | ICD-10-CM | POA: Diagnosis not present

## 2017-04-09 DIAGNOSIS — G4733 Obstructive sleep apnea (adult) (pediatric): Secondary | ICD-10-CM | POA: Diagnosis not present

## 2017-05-04 DIAGNOSIS — Z961 Presence of intraocular lens: Secondary | ICD-10-CM | POA: Diagnosis not present

## 2017-05-04 DIAGNOSIS — E119 Type 2 diabetes mellitus without complications: Secondary | ICD-10-CM | POA: Diagnosis not present

## 2017-06-29 DIAGNOSIS — E1140 Type 2 diabetes mellitus with diabetic neuropathy, unspecified: Secondary | ICD-10-CM | POA: Diagnosis not present

## 2017-07-05 DIAGNOSIS — G4733 Obstructive sleep apnea (adult) (pediatric): Secondary | ICD-10-CM | POA: Diagnosis not present

## 2017-09-05 DIAGNOSIS — G4733 Obstructive sleep apnea (adult) (pediatric): Secondary | ICD-10-CM | POA: Diagnosis not present

## 2017-10-14 DIAGNOSIS — E559 Vitamin D deficiency, unspecified: Secondary | ICD-10-CM | POA: Diagnosis not present

## 2017-10-14 DIAGNOSIS — E782 Mixed hyperlipidemia: Secondary | ICD-10-CM | POA: Diagnosis not present

## 2017-10-14 DIAGNOSIS — Z0000 Encounter for general adult medical examination without abnormal findings: Secondary | ICD-10-CM | POA: Diagnosis not present

## 2017-10-14 DIAGNOSIS — M940 Chondrocostal junction syndrome [Tietze]: Secondary | ICD-10-CM | POA: Diagnosis not present

## 2017-10-14 DIAGNOSIS — Z23 Encounter for immunization: Secondary | ICD-10-CM | POA: Diagnosis not present

## 2017-10-14 DIAGNOSIS — E1140 Type 2 diabetes mellitus with diabetic neuropathy, unspecified: Secondary | ICD-10-CM | POA: Diagnosis not present

## 2017-10-14 DIAGNOSIS — Z5181 Encounter for therapeutic drug level monitoring: Secondary | ICD-10-CM | POA: Diagnosis not present

## 2017-10-19 DIAGNOSIS — G4733 Obstructive sleep apnea (adult) (pediatric): Secondary | ICD-10-CM | POA: Diagnosis not present

## 2018-01-24 DIAGNOSIS — R05 Cough: Secondary | ICD-10-CM | POA: Diagnosis not present

## 2018-01-24 DIAGNOSIS — J0110 Acute frontal sinusitis, unspecified: Secondary | ICD-10-CM | POA: Diagnosis not present

## 2018-01-27 DIAGNOSIS — G4733 Obstructive sleep apnea (adult) (pediatric): Secondary | ICD-10-CM | POA: Diagnosis not present

## 2019-10-23 ENCOUNTER — Other Ambulatory Visit: Payer: Self-pay

## 2019-10-23 ENCOUNTER — Ambulatory Visit (INDEPENDENT_AMBULATORY_CARE_PROVIDER_SITE_OTHER): Payer: Medicare Other | Admitting: Cardiovascular Disease

## 2019-10-23 ENCOUNTER — Encounter: Payer: Self-pay | Admitting: Cardiovascular Disease

## 2019-10-23 VITALS — BP 104/70 | HR 69 | Ht 64.0 in | Wt 189.0 lb

## 2019-10-23 DIAGNOSIS — I480 Paroxysmal atrial fibrillation: Secondary | ICD-10-CM

## 2019-10-23 DIAGNOSIS — I4891 Unspecified atrial fibrillation: Secondary | ICD-10-CM | POA: Insufficient documentation

## 2019-10-23 NOTE — Patient Instructions (Signed)
Medication Instructions:  Your physician recommends that you continue on your current medications as directed. Please refer to the Current Medication list given to you today.  *If you need a refill on your cardiac medications before your next appointment, please call your pharmacy*   Lab Work: None Ordered If you have labs (blood work) drawn today and your tests are completely normal, you will receive your results only by: MyChart Message (if you have MyChart) OR A paper copy in the mail If you have any lab test that is abnormal or we need to change your treatment, we will call you to review the results.   Testing/Procedures: Your physician has requested that you have an echocardiogram. Echocardiography is a painless test that uses sound waves to create images of your heart. It provides your doctor with information about the size and shape of your heart and how well your heart's chambers and valves are working. This procedure takes approximately one hour. There are no restrictions for this procedure.    Follow-Up: At [facility], you and your health needs are our priority.  As part of our continuing mission to provide you with exceptional heart care, we have created designated Provider Care Teams.  These Care Teams include your primary Cardiologist (physician) and Advanced Practice Providers (APPs -  Physician Assistants and Nurse Practitioners) who all work together to provide you with the care you need, when you need it.   Your next appointment:   1 year(s)  The format for your next appointment:   In Person  Provider:   You may see Philip Nahser, MD or one of the following Advanced Practice Providers on your designated Care Team:   Scott Weaver, PA-C Vin Bhagat, PA-C     

## 2019-10-23 NOTE — Progress Notes (Signed)
Cardiology Office Note:    Date:  10/23/2019   ID:  Erika [name], DOB 1955-05-03, MRN [id number]  PCP:  Erika Boston, MD  [place] Cardiologist:  No primary care provider on file.  Erika [name] Electrophysiologist:  None   Referring MD: Erika [name], Erika [name]   Chief Complaint  Patient presents with  . Atrial Fibrillation  . Diabetes Mellitus    Sept. 14, 2021   Erika [name] is a 64 y.o. female with DM, Afib.   We are asked to see her for further evaluation of her atrial fib by Erika [name], Erika [name]   She was found to have incidental Afib at her last routine physical .   Was started on Eliquis and Dilt CD 120 mg a day   Used to work out, but has not recently  No CP , no dyspnea .   Could not tell that her HR was irreg.  No CP, no dysepna No exerercise intolerence No syncope, or presyncope   Past Medical History:  Diagnosis Date  . Anemia    "just related to fibroids"  . Anxiety    takes Clonazepam daily as needed  . Arthritis    "wrists; neck" (04/10/2013)  . Basal cell carcinoma of nasal tip   . Cataract    bilateral  . Cramping of feet    takes Potassium daily  . Dysphagia   . History of colon polyps   . Hyperlipidemia    takes Crestor nightly "because I'm a diabetic"  . Hypertension   . Insomnia    takes Benadryl nightly  . Joint pain   . Joint swelling   . Neuromuscular disorder ([place])    feet  . Neuropathy    both feet  . Osteoarthritis of right knee 04/10/2013  . Seasonal allergies    takes Omnaris and Claritin daily as needed  . SVD (spontaneous vaginal delivery)    x 1  . Type II diabetes mellitus (HCC)    takes Janumet daily  . Urinary frequency     Past Surgical History:  Procedure Laterality Date  . ANTERIOR AND POSTERIOR REPAIR N/A 02/06/2014   Procedure: ANTERIOR (CYSTOCELE) AND POSTERIOR REPAIR (RECTOCELE);  Surgeon: Cheri Fowler, MD;  Location: [facility];  Service: Gynecology;  Laterality: N/A;  . BONE SPURS Left 1970's   "ankle"   . CLOSED REDUCTION NASAL FRACTURE  ~ 2012  . COLONOSCOPY    . HYSTEROSCOPY WITH RESECTOSCOPE  2004   "w/fibroids removed"  . JOINT REPLACEMENT     right knee  . MELANOMA EXCISION  2014   "tip of nose"  . TONSILLECTOMY  ~ 1959  . TOTAL KNEE ARTHROPLASTY Right 04/10/2013  . TOTAL KNEE ARTHROPLASTY Right 04/10/2013   Procedure: TOTAL KNEE ARTHROPLASTY;  Surgeon: Johnny Bridge, MD;  Location: [facility];  Service: Orthopedics;  Laterality: Right;  Marland Kitchen VAGINAL HYSTERECTOMY N/A 02/06/2014   Procedure: HYSTERECTOMY VAGINAL;  Surgeon: Cheri Fowler, MD;  Location: [facility];  Service: Gynecology;  Laterality: N/A;    Current Medications: Current Meds  Medication Sig  . ALPRAZolam (XANAX) 0.25 MG tablet Take 0.25 mg by mouth at bedtime as needed.  . ARTIFICIAL TEARS 0.1-0.3 % SOLN Place 2 drops into both eyes 3 (three) times daily.   . Calcium Carb-Cholecalciferol (CALCIUM 600+D3) 600-800 MG-UNIT TABS Take 1 tablet by mouth 2 (two) times daily.  . cetirizine (ZYRTEC) 10 MG tablet Take 10 mg by mouth daily.  Marland Kitchen DILT-XR 120 MG 24 hr capsule  Take 120 mg by mouth daily.  . diphenhydrAMINE (BENADRYL) 25 mg capsule Take 25-50 mg by mouth at bedtime.  Marland Kitchen ELIQUIS 5 MG TABS tablet Take 5 mg by mouth 2 (two) times daily.  Marland Kitchen escitalopram (LEXAPRO) 10 MG tablet Take 1 tablet by mouth daily.  . fluticasone (FLONASE) 50 MCG/ACT nasal spray Place 1 spray into both nostrils daily.  Marland Kitchen FREESTYLE LITE test strip 1 each by Other route as needed.  Marland Kitchen JANUVIA 100 MG tablet Take 100 mg by mouth daily.  Marland Kitchen lisinopril (PRINIVIL,ZESTRIL) 2.5 MG tablet Take 2.5 mg by mouth every morning.   . Melatonin 5 MG TABS Take 1 tablet by mouth at bedtime as needed.  . metFORMIN (GLUCOPHAGE-XR) 500 MG 24 hr tablet Take 1,000 mg by mouth 2 (two) times daily.  . montelukast (SINGULAIR) 10 MG tablet Take 10 mg by mouth daily.  . Omega-3 Fatty Acids (FISH OIL PO) Take 1 tablet by mouth daily.  . Potassium Gluconate 550 MG TABS Take 2 tablets by  mouth every evening.   . rosuvastatin (CRESTOR) 10 MG tablet Take 5 mg by mouth every evening.   . [DISCONTINUED] aspirin 325 MG EC tablet Take 325 mg by mouth daily.  . [DISCONTINUED] clonazePAM (KLONOPIN) 0.5 MG tablet Take 0.5 mg by mouth 2 (two) times daily as needed for anxiety.  . [DISCONTINUED] glucosamine-chondroitin 500-400 MG tablet Take 1 tablet by mouth daily.   . [DISCONTINUED] oxyCODONE-acetaminophen (PERCOCET/ROXICET) 5-325 MG per tablet Take 1-2 tablets by mouth every 4 (four) hours as needed for severe pain (moderate to severe pain (when tolerating fluids)).  . [DISCONTINUED] sitaGLIPtin-metformin (JANUMET) 50-500 MG per tablet Take 1 tablet by mouth 2 (two) times daily with a meal.     Allergies:   Amoxicillin, Nsaids, and Tetanus toxoids   Social History   Socioeconomic History  . Marital status: Married    Spouse name: Not on file  . Number of children: Not on file  . Years of education: Not on file  . Highest education level: Not on file  Occupational History  . Not on file  Tobacco Use  . Smoking status: Never Smoker  . Smokeless tobacco: Never Used  Substance and Sexual Activity  . Alcohol use: Yes    Alcohol/week: 3.0 standard drinks    Types: 3 Glasses of wine per week    Comment: socially  . Drug use: No  . Sexual activity: Yes    Birth control/protection: Post-menopausal  Other Topics Concern  . Not on file  Social History Narrative  . Not on file   Social Determinants of Health   Financial Resource Strain:   . Difficulty of Paying Living Expenses: Not on file  Food Insecurity:   . Worried About Charity fundraiser in the Last Year: Not on file  . Ran Out of Food in the Last Year: Not on file  Transportation Needs:   . Lack of Transportation (Medical): Not on file  . Lack of Transportation (Non-Medical): Not on file  Physical Activity:   . Days of Exercise per Week: Not on file  . Minutes of Exercise per Session: Not on file  Stress:   .  Feeling of Stress : Not on file  Social Connections:   . Frequency of Communication with Friends and Family: Not on file  . Frequency of Social Gatherings with Friends and Family: Not on file  . Attends Religious Services: Not on file  . Active Member of Clubs or Organizations: Not  on file  . Attends Archivist Meetings: Not on file  . Marital Status: Not on file     Family History: The patient's family history includes Bronchitis in her mother; Diabetes in her sister; Liver cancer in her father.  ROS:   Please see the history of present illness.     All other systems reviewed and are negative.  EKGs/Labs/Other Studies Reviewed:    The following studies were reviewed today:     Recent Labs: No results found for requested labs within last 8760 hours.  Recent Lipid Panel No results found for: CHOL, TRIG, HDL, CHOLHDL, VLDL, LDLCALC, LDLDIRECT  Physical Exam:    VS:  BP 104/70   Pulse 69   Ht 5\' 4"  (1.626 m)   Wt 189 lb (85.7 kg)   SpO2 97%   BMI 32.44 kg/m     Wt Readings from Last 3 Encounters:  10/23/19 189 lb (85.7 kg)  02/06/14 190 lb (86.2 kg)  01/29/14 190 lb (86.2 kg)     GEN:  Well nourished, well developed in no acute distress HEENT: Normal NECK: No JVD; No carotid bruits LYMPHATICS: No lymphadenopathy CARDIAC: RRR, no murmurs, rubs, gallops RESPIRATORY:  Clear to auscultation without rales, wheezing or rhonchi  ABDOMEN: Soft, non-tender, non-distended MUSCULOSKELETAL:  No edema; No deformity  SKIN: Warm and dry NEUROLOGIC:  Alert and oriented x 3 PSYCHIATRIC:  Normal affect   EKG:   Sept. 14, 2021:  NSR at 53.  No ST or T wave change   ASSESSMENT:    1. Paroxysmal atrial fibrillation (HCC)    PLAN:    In order of problems listed above:  1. Atrial fibrillation: Patient presents with paroxysmal atrial fibrillation.  She is in normal sinus rhythm today.  Her CHA2DS2-VASc score is 68  ( female, age 46, DM)  Agree with eliquis , agree  with diltiazem for now  We discussed Afib and the risk of thromboembolism.   Echo Will see her in a year.    Medication Adjustments/Labs and Tests Ordered: Current medicines are reviewed at length with the patient today.  Concerns regarding medicines are outlined above.  Orders Placed This Encounter  Procedures  . EKG 12-Lead  . ECHOCARDIOGRAM COMPLETE   No orders of the defined types were placed in this encounter.   Patient Instructions  Medication Instructions:  Your physician recommends that you continue on your current medications as directed. Please refer to the Current Medication list given to you today.  *If you need a refill on your cardiac medications before your next appointment, please call your pharmacy*   Lab Work: None Ordered *If you have labs (blood work) drawn today and your tests are completely normal, you will receive your results only by: Marland Kitchen MyChart Message (if you have MyChart) OR . A paper copy in the mail If you have any lab test that is abnormal or we need to change your treatment, we will call you to review the results.   Testing/Procedures: Your physician has requested that you have an echocardiogram. Echocardiography is a painless test that uses sound waves to create images of your heart. It provides your doctor with information about the size and shape of your heart and how well your heart's chambers and valves are working. This procedure takes approximately one hour. There are no restrictions for this procedure.    Follow-Up: At [facility], you and your health needs are our priority.  As part of our continuing mission to provide  you with exceptional heart care, we have created designated Provider Care Teams.  These Care Teams include your primary Cardiologist (physician) and Advanced Practice Providers (APPs -  Physician Assistants and Nurse Practitioners) who all work together to provide you with the care you need, when you need it.   Your  next appointment:   1 year(s)  The format for your next appointment:   In Person  Provider:   You may see Mertie Moores, MD or one of the following Advanced Practice Providers on your designated Care Team:    Richardson Dopp, Erika [name]  Robbie Lis, [place]        Signed, Mertie Moores, MD  10/23/2019 3:19 PM    [place]

## 2019-10-29 ENCOUNTER — Telehealth: Payer: Self-pay | Admitting: *Deleted

## 2019-10-29 NOTE — Telephone Encounter (Signed)
Patient with diagnosis of A Fib on Eliquis for anticoagulation.    Procedure: colonoscopy Date of procedure: 12/20/19  CHADS2-VASc score of  3  (DM2, AGE, female)  CrCl 99 mL min using adjusted body weight   Per office protocol, patient can hold Eliquis for 2-3 days prior to procedure.

## 2019-10-29 NOTE — Telephone Encounter (Addendum)
Pharm reply reviewed. Now awaiting echo 9/24 as outlined below to ensure no major issues then will need to finalize clearance.

## 2019-10-29 NOTE — Telephone Encounter (Signed)
   Primary Cardiologist: Mertie Moores, MD  Chart reviewed as part of pre-operative protocol coverage.  SHARONA ROVNER was last seen on 10/23/19 by Dr. Acie Fredrickson with recently identified PAF anxiety, arthritis, HLD, HTN, DM, neuropathy. She was placed on anticoagulation and 2D echo planned which is scheduled for 11/02/19. She is to f/u in 1 year.  Will send this message to pharm for review on anticoagulation.  Will also await echocardiogram which is just scheduled a few days away before providing final clearance decision.  Charlie Pitter, PA-C 10/29/2019, 12:21 PM

## 2019-10-29 NOTE — Telephone Encounter (Signed)
   [facility] Pre-operative Risk Assessment    HEARTCARE STAFF: - Please ensure there is not already an duplicate clearance open for this procedure. - Under Visit Info/Reason for Call, type in Other and utilize the format Clearance MM/DD/YY or Clearance TBD. Do not use dashes or single digits. - If request is for dental extraction, please clarify the # of teeth to be extracted.  Request for surgical clearance:  1. What type of surgery is being performed? Colonoscopy  2. When is this surgery scheduled? 12/20/2019   3. What type of clearance is required (medical clearance vs. Pharmacy clearance to hold med vs. Both)? Both  4. Are there any medications that need to be held prior to surgery and how long?Eliquis   5. Practice name and name of physician performing surgery? [facility]; Dr Michail Sermon  6. What is the office phone number? [phone]   7.   What is the office fax number? [phone]  8.   Anesthesia type (None, local, MAC, general) ? None listed   Juventino Slovak 10/29/2019, 8:36 AM  _________________________________________________________________   (provider comments below)

## 2019-11-02 ENCOUNTER — Ambulatory Visit (HOSPITAL_COMMUNITY): Payer: Medicare Other | Attending: Cardiology

## 2019-11-02 ENCOUNTER — Other Ambulatory Visit: Payer: Self-pay

## 2019-11-02 DIAGNOSIS — I480 Paroxysmal atrial fibrillation: Secondary | ICD-10-CM | POA: Diagnosis not present

## 2019-11-02 LAB — ECHOCARDIOGRAM COMPLETE: S' Lateral: 3 cm

## 2019-11-06 NOTE — Telephone Encounter (Signed)
   Primary Cardiologist: Mertie Moores, MD  Chart reviewed as part of pre-operative protocol coverage. Given past medical history and time since last visit, based on ACC/AHA guidelines, Erika [name] would be at acceptable risk for the planned procedure without further cardiovascular testing.   Per pharmacy recommendations, patient can hold eliquis 2-3 days prior to her upcoming colonoscopy with plans to restart as soon as she is cleared to do so by her gastroenterologist.   I will route this recommendation to the requesting party via [facility] fax function and remove from pre-op pool.  Please call with questions.  Abigail Butts, PA-C 11/06/2019, 12:56 PM

## 2020-07-24 ENCOUNTER — Telehealth: Payer: Self-pay | Admitting: Cardiovascular Disease

## 2020-07-24 NOTE — Telephone Encounter (Signed)
Fine with me

## 2020-07-24 NOTE — Telephone Encounter (Signed)
    Pt would like to switch from Dr. Acie Fredrickson to Dr. Debara Pickett. She said her husband is seeing Dr. Debara Pickett and would like to have the same heart doctor

## 2020-07-24 NOTE — Telephone Encounter (Signed)
    STAT if HR is under 50 or over 120 (normal HR is 60-100 beats per minute)  What is your heart rate? 135-140 walking from living room to her kitchen, 111-113 while sitting  Do you have a log of your heart rate readings (document readings)?   Do you have any other symptoms? Pt said her HR been fluctuating, she said in rare occasion she get a little short of breath for doing light activity. She saw Dr. Acie Fredrickson and requesting to switch with Dr. Debara Pickett, she just wanted to know what she needs to do for the mean time regarding her HR

## 2020-07-24 NOTE — Telephone Encounter (Signed)
Would advise she monitor HR - if it continues to remain elevated, we could provide her with 30 mg short-acting diltiazem to take as needed for HR >130.  Dr. Lemmie Evens

## 2020-07-24 NOTE — Telephone Encounter (Signed)
Pt noticing HR fluctuating via pulse ox 130 down to 50.  Never noticed sustained rates.  Usually runs in 31s.  Does not check BP regularly.     She has no SOB, chest pressure or tightness, fatigue or feeling heart racing or pounding.  She stopped going to the gym when she found out she as afib.  I have encouraged her to return and use her symptoms as a guide.    She is taking Eliquis BID and diltiazem 120 mg daily.  Will begin keeping an eye on BP.  Adv to not focus a lot on the pulse oximeter and check periodically only, unless she feels any of the symptoms we discussed.   Pt aware I am forwarding to Dr. Angelena Form to make aware and we will call her back if there are any additional recommendations.

## 2020-07-25 NOTE — Telephone Encounter (Signed)
Attempt to call patient, unable to reach.  "Call cannot be completed at this time"

## 2020-07-29 NOTE — Telephone Encounter (Signed)
Called patient, LVM advising of Dr.Hilty's recommendations and to call back if medication was needed per message and if she was still having high hr.  Left call back number.

## 2021-01-08 ENCOUNTER — Encounter: Payer: Self-pay | Admitting: Internal Medicine

## 2021-01-08 ENCOUNTER — Other Ambulatory Visit: Payer: Self-pay

## 2021-01-08 ENCOUNTER — Ambulatory Visit (INDEPENDENT_AMBULATORY_CARE_PROVIDER_SITE_OTHER): Payer: Medicare Other | Admitting: Internal Medicine

## 2021-01-08 VITALS — BP 122/74 | HR 104 | Ht 64.5 in | Wt 180.4 lb

## 2021-01-08 DIAGNOSIS — E119 Type 2 diabetes mellitus without complications: Secondary | ICD-10-CM

## 2021-01-08 DIAGNOSIS — E782 Mixed hyperlipidemia: Secondary | ICD-10-CM

## 2021-01-08 DIAGNOSIS — I480 Paroxysmal atrial fibrillation: Secondary | ICD-10-CM

## 2021-01-08 DIAGNOSIS — I10 Essential (primary) hypertension: Secondary | ICD-10-CM | POA: Diagnosis not present

## 2021-01-08 NOTE — Progress Notes (Signed)
OFFICE CONSULT NOTE  Chief Complaint:  Follow-up  Primary Care Physician: Scheryl Marten, PA  HPI:  Erika [name] is a 65 y.o. female who is being seen today for the evaluation of atrial fibrillation at the request of Wile, Jesse Sans, MD. this is a pleasant 65 year old female seen initially by Dr. Meda Coffee in 2015 for cardiac clearance and then followed by Dr. Acie Fredrickson for atrial fibrillation.  Her husband is a patient of mine and she requested to switch to me for her primary cardiologist.  She is here to establish care.  She has a history of A. fib and it has generally been rate controlled.  She is anticoagulated on Eliquis and takes diltiazem.  She also is on low-dose lisinopril for diabetes particularly.  She takes Crestor and is on Trulicity, Januvia and metformin for her diabetes.  Labs in July 2022 showed total cholesterol 134, HDL 29, triglycerides 172 and LDL 75.  A1c 6.4%.  PMHx:  Past Medical History:  Diagnosis Date   Anemia    "just related to fibroids"   Anxiety    takes Clonazepam daily as needed   Arthritis    "wrists; neck" (04/10/2013)   Basal cell carcinoma of nasal tip    Cataract    bilateral   Cramping of feet    takes Potassium daily   Dysphagia    History of colon polyps    Hyperlipidemia    takes Crestor nightly "because I'm a diabetic"   Hypertension    Insomnia    takes Benadryl nightly   Joint pain    Joint swelling    Neuromuscular disorder ([place])    feet   Neuropathy    both feet   Osteoarthritis of right knee 04/10/2013   Seasonal allergies    takes Omnaris and Claritin daily as needed   SVD (spontaneous vaginal delivery)    x 1   Type II diabetes mellitus ([place])    takes Janumet daily   Urinary frequency     Past Surgical History:  Procedure Laterality Date   ANTERIOR AND POSTERIOR REPAIR N/A 02/06/2014   Procedure: ANTERIOR (CYSTOCELE) AND POSTERIOR REPAIR (RECTOCELE);  Surgeon: Cheri Fowler, MD;  Location: [facility];  Service: Gynecology;   Laterality: N/A;   BONE SPURS Left 1970's   "ankle"   CLOSED REDUCTION NASAL FRACTURE  ~ 2012   COLONOSCOPY     HYSTEROSCOPY WITH RESECTOSCOPE  2004   "w/fibroids removed"   JOINT REPLACEMENT     right knee   MELANOMA EXCISION  2014   "tip of nose"   TONSILLECTOMY  ~ [place] Right 04/10/2013   TOTAL KNEE ARTHROPLASTY Right 04/10/2013   Procedure: TOTAL KNEE ARTHROPLASTY;  Surgeon: Johnny Bridge, MD;  Location: [facility];  Service: Orthopedics;  Laterality: Right;   VAGINAL HYSTERECTOMY N/A 02/06/2014   Procedure: HYSTERECTOMY VAGINAL;  Surgeon: Cheri Fowler, MD;  Location: [facility];  Service: Gynecology;  Laterality: N/A;    FAMHx:  Family History  Problem Relation Age of Onset   Bronchitis Mother    Liver cancer Father    Diabetes Sister     SOCHx:   reports that she has never smoked. She has never used smokeless tobacco. She reports current alcohol use of about 3.0 standard drinks per week. She reports that she does not use drugs.  ALLERGIES:  Allergies  Allergen Reactions   Amoxicillin     Only one case of HIVES.  Has taken  it in the past.   Nsaids Nausea And Vomiting   Tetanus Toxoids Other (See Comments)    Very high fever    ROS: Pertinent items noted in HPI and remainder of comprehensive ROS otherwise negative.  HOME MEDS: Current Outpatient Medications on File Prior to Visit  Medication Sig Dispense Refill   ALPRAZolam (XANAX) 0.25 MG tablet Take 0.25 mg by mouth at bedtime as needed.     ARTIFICIAL TEARS 0.1-0.3 % SOLN Place 2 drops into both eyes 3 (three) times daily.      Calcium Carb-Cholecalciferol 600-800 MG-UNIT TABS Take 1 tablet by mouth 2 (two) times daily.     cetirizine (ZYRTEC) 10 MG tablet Take 10 mg by mouth daily.     DILT-XR 120 MG 24 hr capsule Take 120 mg by mouth daily.     diphenhydrAMINE (BENADRYL) 25 mg capsule Take 25-50 mg by mouth at bedtime.     ELIQUIS 5 MG TABS tablet Take 5 mg by mouth 2 (two) times daily.      escitalopram (LEXAPRO) 10 MG tablet Take 1 tablet by mouth daily.     fluticasone (FLONASE) 50 MCG/ACT nasal spray Place 1 spray into both nostrils daily.     FREESTYLE LITE test strip 1 each by Other route as needed.     JANUVIA 100 MG tablet Take 100 mg by mouth daily.     lisinopril (PRINIVIL,ZESTRIL) 2.5 MG tablet Take 2.5 mg by mouth every morning.      metFORMIN (GLUCOPHAGE-XR) 500 MG 24 hr tablet Take 1,000 mg by mouth 2 (two) times daily.     montelukast (SINGULAIR) 10 MG tablet Take 10 mg by mouth daily.     Omega-3 Fatty Acids (FISH OIL PO) Take 1 tablet by mouth daily.     Potassium Gluconate 550 MG TABS Take 2 tablets by mouth every evening.      rosuvastatin (CRESTOR) 10 MG tablet Take 5 mg by mouth every evening.      TRULICITY 4.26 ST/4.1DQ SOPN Inject into the skin.     No current facility-administered medications on file prior to visit.    LABS/IMAGING: No results found for this or any previous visit (from the past 48 hour(s)). No results found.  LIPID PANEL: No results found for: CHOL, TRIG, HDL, CHOLHDL, VLDL, LDLCALC, LDLDIRECT  WEIGHTS: Wt Readings from Last 3 Encounters:  01/08/21 180 lb 6.4 oz (81.8 kg)  10/23/19 189 lb (85.7 kg)  02/06/14 190 lb (86.2 kg)    VITALS: BP 122/74   Pulse (!) 104   Ht 5' 4.5" (1.638 m)   Wt 180 lb 6.4 oz (81.8 kg)   SpO2 96%   BMI 30.49 kg/m   EXAM: General appearance: alert and no distress Neck: no carotid bruit, no JVD, and thyroid not enlarged, symmetric, no tenderness/mass/nodules Lungs: clear to auscultation bilaterally Heart: irregularly irregular rhythm Abdomen: soft, non-tender; bowel sounds normal; no masses,  no organomegaly Extremities: extremities normal, atraumatic, no cyanosis or edema Pulses: 2+ and symmetric Skin: Skin color, texture, turgor normal. No rashes or lesions Neurologic: Grossly normal Psych: Pleasant  EKG: A. fib with RVR 104- personally reviewed  ASSESSMENT: Longstanding  persistent atrial fibrillation (severe LAE, LVEF 60 to 65%)-10/2019 Hypertension Type 2 diabetes Dyslipidemia  PLAN: 1.   Erika [name] has longstanding persistent atrial fibrillation anticoagulated on Eliquis.  In general has good rate control.  Blood pressure seems well controlled.  Her diabetes is at target.  Cholesterol is also well controlled with LDL  in the 70s.  I see no need to change her medicines today.  Plan follow-up with me in 6 months if she continues to do well annually thereafter.  Pixie Casino, MD, [facility], [place] Director of the Advanced Lipid Disorders &  Cardiovascular Risk Reduction Clinic Diplomate of the American Board of Clinical Lipidology Attending Cardiologist  Direct Dial: [phone]  Fax: [phone]  Website:  [email or web address] 01/08/2021, 5:42 PM

## 2021-01-08 NOTE — Patient Instructions (Signed)
Medication Instructions:  Your physician recommends that you continue on your current medications as directed. Please refer to the Current Medication list given to you today.  *If you need a refill on your cardiac medications before your next appointment, please call your pharmacy*   Follow-Up: At [facility], you and your health needs are our priority.  As part of our continuing mission to provide you with exceptional heart care, we have created designated Provider Care Teams.  These Care Teams include your primary Cardiologist (physician) and Advanced Practice Providers (APPs -  Physician Assistants and Nurse Practitioners) who all work together to provide you with the care you need, when you need it.  We recommend signing up for the patient portal called "MyChart".  Sign up information is provided on this After Visit Summary.  MyChart is used to connect with patients for Virtual Visits (Telemedicine).  Patients are able to view lab/test results, encounter notes, upcoming appointments, etc.  Non-urgent messages can be sent to your provider as well.   To learn more about what you can do with MyChart, go to [email or web address].    Your next appointment:   6 month(s)  The format for your next appointment:   In Person  Provider:   Dr. Kenneth Hilty  

## 2021-03-03 ENCOUNTER — Ambulatory Visit
Admission: RE | Admit: 2021-03-03 | Discharge: 2021-03-03 | Disposition: A | Discharge status: Home / Self Care | Payer: Medicare Other | Source: Ambulatory Visit | Attending: Internal Medicine | Admitting: Internal Medicine

## 2021-03-03 ENCOUNTER — Other Ambulatory Visit: Payer: Self-pay | Admitting: Internal Medicine

## 2021-03-03 DIAGNOSIS — M25511 Pain in right shoulder: Secondary | ICD-10-CM

## 2021-03-26 ENCOUNTER — Telehealth: Payer: Self-pay

## 2021-03-26 DIAGNOSIS — E1142 Type 2 diabetes mellitus with diabetic polyneuropathy: Secondary | ICD-10-CM | POA: Insufficient documentation

## 2021-03-26 NOTE — Telephone Encounter (Signed)
Patient with diagnosis of afib on Eliquis for anticoagulation.    Procedure: RIGHT SHOULDER SCOPE/ROTATOR CUFF REPAIR Date of procedure: TBD  CHA2DS2-VASc Score = 4  This indicates a 4.8% annual risk of stroke. The patient's score is based upon: CHF History: 0 HTN History: 1 Diabetes History: 1 Stroke History: 0 Vascular Disease History: 0 Age Score: 1 Gender Score: 1   CrCl 32mL/min using adjusted body weight Platelet count 187K  Per office protocol, patient can hold Eliquis for 2-3 days prior to procedure.

## 2021-03-26 NOTE — Telephone Encounter (Signed)
   Pre-operative Risk Assessment    Patient Name: Erika [name]  DOB: 1955/06/26 MRN: [id number]      Request for Surgical Clearance    Procedure:   RIGHT SHOULDER SCOPE/ROTATOR CUFF REPAIR  Date of Surgery:  Clearance TBD                                 Surgeon:  Marchia Bond, MD Surgeon's Group or Practice Name:  [facility] ATTN:[facility] Phone number:  [phone] Fax number:  [phone]   Type of Clearance Requested:   - Medical  - Pharmacy:  Hold Apixaban (Eliquis)     Type of Anesthesia:   CHOICE   Additional requests/questions:   03/26/2021, 7:14 AM

## 2021-03-26 NOTE — Telephone Encounter (Signed)
°  Pt is returning call, she said she missed a call from [facility]

## 2021-03-26 NOTE — Telephone Encounter (Signed)
° °  Pt is returning call, she apologized she got her phone ring low, she said can call her back on [phone]

## 2021-03-27 NOTE — Telephone Encounter (Signed)
° °  Name: Erika [name]  DOB: Dec 20, 1955  MRN: [id number]   Primary Cardiologist: Pixie Casino, MD  Chart reviewed as part of pre-operative protocol coverage. Patient was contacted 03/27/2021 in reference to pre-operative risk assessment for pending surgery as outlined below.  Erika [name] was last seen on 01/08/2021 by Dr. Debara Pickett.  Since that day, Erika [name] has done well and is without symptoms of angina or decompensation.  She may hold Eliquis for 2-3 days prior to surgery.  Resumption of Eliquis she occur as soon as safely possible as deemed by the surgical team.   Therefore, based on ACC/AHA guidelines, the patient would be at acceptable risk for the planned procedure without further cardiovascular testing.   The patient was advised that if she develops new symptoms prior to surgery to contact our office to arrange for a follow-up visit, and she verbalized understanding.  I will route this recommendation to the requesting party via Epic fax function and remove from pre-op pool. Please call with questions.  Christell Faith, PA-C 03/27/2021, 9:28 AM

## 2021-06-29 ENCOUNTER — Encounter: Payer: Self-pay | Admitting: Internal Medicine

## 2021-06-29 ENCOUNTER — Ambulatory Visit (INDEPENDENT_AMBULATORY_CARE_PROVIDER_SITE_OTHER): Payer: Medicare Other | Admitting: Internal Medicine

## 2021-06-29 VITALS — BP 105/69 | HR 110 | Ht 64.0 in | Wt 162.2 lb

## 2021-06-29 DIAGNOSIS — I10 Essential (primary) hypertension: Secondary | ICD-10-CM

## 2021-06-29 DIAGNOSIS — E782 Mixed hyperlipidemia: Secondary | ICD-10-CM | POA: Diagnosis not present

## 2021-06-29 DIAGNOSIS — Z7901 Long term (current) use of anticoagulants: Secondary | ICD-10-CM | POA: Diagnosis not present

## 2021-06-29 DIAGNOSIS — I480 Paroxysmal atrial fibrillation: Secondary | ICD-10-CM

## 2021-06-29 NOTE — Progress Notes (Signed)
OFFICE CONSULT NOTE  Chief Complaint:  Follow-up  Primary Care Physician: Scheryl Marten, PA  HPI:  Erika [name] is a 66 y.o. female who is being seen today for the evaluation of atrial fibrillation at the request of Sabra Heck, [place], [place]. this is a pleasant 66 year old female seen initially by Dr. Meda Coffee in 2015 for cardiac clearance and then followed by Dr. Acie Fredrickson for atrial fibrillation.  Her husband is a patient of mine and she requested to switch to me for her primary cardiologist.  She is here to establish care.  She has a history of A. fib and it has generally been rate controlled.  She is anticoagulated on Eliquis and takes diltiazem.  She also is on low-dose lisinopril for diabetes particularly.  She takes Crestor and is on Trulicity, Januvia and metformin for her diabetes.  Labs in July 2022 showed total cholesterol 134, HDL 29, triglycerides 172 and LDL 75.  A1c 6.4%.  06/29/2021  Erika [name] is seen today in follow-up.  She continues to be asymptomatic with regards to her A-fib.  Heart rate at worst about 110 but in general in the low 100s to upper 90s.  Blood pressure runs in the low 100s and therefore there is little room to uptitrate her medication.  She is on diltiazem.  She remains on Eliquis.  Cholesterol was reassessed last summer and she is due for follow-up which she says is in August with her PCP.  She remains asymptomatic  PMHx:  Past Medical History:  Diagnosis Date   Anemia    "just related to fibroids"   Anxiety    takes Clonazepam daily as needed   Arthritis    "wrists; neck" (04/10/2013)   Basal cell carcinoma of nasal tip    Cataract    bilateral   Cramping of feet    takes Potassium daily   Dysphagia    History of colon polyps    Hyperlipidemia    takes Crestor nightly "because I'm a diabetic"   Hypertension    Insomnia    takes Benadryl nightly   Joint pain    Joint swelling    Neuromuscular disorder ([place])    feet   Neuropathy    both feet    Osteoarthritis of right knee 04/10/2013   Seasonal allergies    takes Omnaris and Claritin daily as needed   SVD (spontaneous vaginal delivery)    x 1   Type II diabetes mellitus ([place])    takes Janumet daily   Urinary frequency     Past Surgical History:  Procedure Laterality Date   ANTERIOR AND POSTERIOR REPAIR N/A 02/06/2014   Procedure: ANTERIOR (CYSTOCELE) AND POSTERIOR REPAIR (RECTOCELE);  Surgeon: Cheri Fowler, MD;  Location: [facility];  Service: Gynecology;  Laterality: N/A;   BONE SPURS Left 1970's   "ankle"   CLOSED REDUCTION NASAL FRACTURE  ~ 2012   COLONOSCOPY     HYSTEROSCOPY WITH RESECTOSCOPE  2004   "w/fibroids removed"   JOINT REPLACEMENT     right knee   MELANOMA EXCISION  2014   "tip of nose"   TONSILLECTOMY  ~ [place] Right 04/10/2013   TOTAL KNEE ARTHROPLASTY Right 04/10/2013   Procedure: TOTAL KNEE ARTHROPLASTY;  Surgeon: Johnny Bridge, MD;  Location: [facility];  Service: Orthopedics;  Laterality: Right;   VAGINAL HYSTERECTOMY N/A 02/06/2014   Procedure: HYSTERECTOMY VAGINAL;  Surgeon: Cheri Fowler, MD;  Location: [facility];  Service: Gynecology;  Laterality: N/A;    FAMHx:  Family History  Problem Relation Age of Onset   Bronchitis Mother    Liver cancer Father    Diabetes Sister     SOCHx:   reports that she has never smoked. She has never used smokeless tobacco. She reports current alcohol use of about 3.0 standard drinks per week. She reports that she does not use drugs.  ALLERGIES:  Allergies  Allergen Reactions   Amoxicillin     Only one case of HIVES.  Has taken it in the past.   Fluticasone Propionate     Other reaction(s): foot cramps-mild   Nsaids Nausea And Vomiting   Tetanus Toxoids Other (See Comments)    Very high fever   Tramadol Hcl     Other reaction(s): agitated    ROS: Pertinent items noted in HPI and remainder of comprehensive ROS otherwise negative.  HOME MEDS: Current Outpatient Medications on File  Prior to Visit  Medication Sig Dispense Refill   ALPRAZolam (XANAX) 0.25 MG tablet Take 0.25 mg by mouth at bedtime as needed.     ARTIFICIAL TEARS 0.1-0.3 % SOLN Place 2 drops into both eyes 3 (three) times daily.      Calcium Carb-Cholecalciferol 600-800 MG-UNIT TABS Take 1 tablet by mouth 2 (two) times daily.     cetirizine (ZYRTEC) 10 MG tablet Take 10 mg by mouth daily.     DILT-XR 120 MG 24 hr capsule Take 120 mg by mouth daily.     diphenhydrAMINE (BENADRYL) 25 mg capsule Take 25-50 mg by mouth at bedtime.     ELIQUIS 5 MG TABS tablet Take 5 mg by mouth 2 (two) times daily.     escitalopram (LEXAPRO) 10 MG tablet Take 1 tablet by mouth daily.     fluticasone (FLONASE) 50 MCG/ACT nasal spray Place 1 spray into both nostrils daily.     FREESTYLE LITE test strip 1 each by Other route as needed.     lisinopril (PRINIVIL,ZESTRIL) 2.5 MG tablet Take 2.5 mg by mouth every morning.      metFORMIN (GLUCOPHAGE-XR) 500 MG 24 hr tablet Take 1,000 mg by mouth 2 (two) times daily.     montelukast (SINGULAIR) 10 MG tablet Take 10 mg by mouth daily.     Omega-3 Fatty Acids (FISH OIL PO) Take 1 tablet by mouth daily.     Potassium Gluconate 550 MG TABS Take 2 tablets by mouth every evening.      rosuvastatin (CRESTOR) 10 MG tablet Take 5 mg by mouth every evening.      tiZANidine (ZANAFLEX) 4 MG tablet Take 4 mg by mouth 2 (two) times daily as needed.     TRULICITY 6.96 VE/9.3YB SOPN Inject into the skin.     JANUVIA 100 MG tablet Take 100 mg by mouth daily. (Patient not taking: Reported on 06/29/2021)     No current facility-administered medications on file prior to visit.    LABS/IMAGING: No results found for this or any previous visit (from the past 48 hour(s)). No results found.  LIPID PANEL: No results found for: CHOL, TRIG, HDL, CHOLHDL, VLDL, LDLCALC, LDLDIRECT  WEIGHTS: Wt Readings from Last 3 Encounters:  06/29/21 162 lb 3.2 oz (73.6 kg)  01/08/21 180 lb 6.4 oz (81.8 kg)  10/23/19  189 lb (85.7 kg)    VITALS: BP 105/69   Pulse (!) 110   Ht '5\' 4"'$  (1.626 m)   Wt 162 lb 3.2 oz (73.6 kg)   SpO2 98%  BMI 27.84 kg/m   EXAM: General appearance: alert and no distress Neck: no carotid bruit, no JVD, and thyroid not enlarged, symmetric, no tenderness/mass/nodules Lungs: clear to auscultation bilaterally Heart: irregularly irregular rhythm Abdomen: soft, non-tender; bowel sounds normal; no masses,  no organomegaly Extremities: extremities normal, atraumatic, no cyanosis or edema Pulses: 2+ and symmetric Skin: Skin color, texture, turgor normal. No rashes or lesions Neurologic: Grossly normal Psych: Pleasant  EKG: A. fib with RVR 110- personally reviewed  ASSESSMENT: Longstanding persistent atrial fibrillation (severe LAE, LVEF 60 to 65%)-10/2019 Hypertension Type 2 diabetes Dyslipidemia  PLAN: 1.   Ms. [name] seems to be doing well with generally well-controlled A-fib.  Blood pressure may not allow increases in her meds.  Cholesterol has been well controlled as well and followed by her PCP who she is going to see again this summer.  No bleeding issues on Eliquis.  Plan follow-up with me annually or sooner as necessary.  Pixie Casino, MD, [facility], [place] Director of the Advanced Lipid Disorders &  Cardiovascular Risk Reduction Clinic Diplomate of the American Board of Clinical Lipidology Attending Cardiologist  Direct Dial: [phone]  Fax: [phone]  Website:  [email or web address] 06/29/2021, 1:33 PM

## 2021-06-29 NOTE — Patient Instructions (Signed)
Medication Instructions:  Your physician recommends that you continue on your current medications as directed. Please refer to the Current Medication list given to you today.  *If you need a refill on your cardiac medications before your next appointment, please call your pharmacy*  Follow-Up: At [facility], you and your health needs are our priority.  As part of our continuing mission to provide you with exceptional heart care, we have created designated Provider Care Teams.  These Care Teams include your primary Cardiologist (physician) and Advanced Practice Providers (APPs -  Physician Assistants and Nurse Practitioners) who all work together to provide you with the care you need, when you need it.  We recommend signing up for the patient portal called "MyChart".  Sign up information is provided on this After Visit Summary.  MyChart is used to connect with patients for Virtual Visits (Telemedicine).  Patients are able to view lab/test results, encounter notes, upcoming appointments, etc.  Non-urgent messages can be sent to your provider as well.   To learn more about what you can do with MyChart, go to [email or web address].    Your next appointment:   12 month(s)  The format for your next appointment:   In Person  Provider:   Pixie Casino, MD {   Important Information About [place]

## 2021-09-30 ENCOUNTER — Other Ambulatory Visit: Payer: Self-pay | Admitting: Obstetrics and Gynecology

## 2021-09-30 DIAGNOSIS — R921 Mammographic calcification found on diagnostic imaging of breast: Secondary | ICD-10-CM

## 2021-10-01 ENCOUNTER — Telehealth: Payer: Self-pay | Admitting: Internal Medicine

## 2021-10-01 NOTE — Telephone Encounter (Signed)
Scheduled appt per 8/24 referral. Pt is aware of appt date and time. Pt is aware to arrive 15 mins prior to appt time and to bring and updated insurance card. Pt is aware of appt location.   

## 2021-10-09 ENCOUNTER — Other Ambulatory Visit: Payer: Self-pay | Admitting: Obstetrics and Gynecology

## 2021-10-09 ENCOUNTER — Ambulatory Visit: Payer: Medicare Other

## 2021-10-09 ENCOUNTER — Ambulatory Visit
Admission: RE | Admit: 2021-10-09 | Discharge: 2021-10-09 | Disposition: A | Discharge status: Home / Self Care | Payer: Medicare Other | Source: Ambulatory Visit | Attending: Obstetrics and Gynecology | Admitting: Obstetrics and Gynecology

## 2021-10-09 DIAGNOSIS — R921 Mammographic calcification found on diagnostic imaging of breast: Secondary | ICD-10-CM

## 2021-10-22 ENCOUNTER — Ambulatory Visit
Admission: RE | Admit: 2021-10-22 | Discharge: 2021-10-22 | Disposition: A | Discharge status: Home / Self Care | Payer: Medicare Other | Source: Ambulatory Visit | Attending: Obstetrics and Gynecology | Admitting: Obstetrics and Gynecology

## 2021-10-22 ENCOUNTER — Other Ambulatory Visit: Payer: Self-pay | Admitting: Obstetrics and Gynecology

## 2021-10-22 DIAGNOSIS — R921 Mammographic calcification found on diagnostic imaging of breast: Secondary | ICD-10-CM

## 2021-10-23 ENCOUNTER — Other Ambulatory Visit: Payer: Self-pay

## 2021-10-23 DIAGNOSIS — D72829 Elevated white blood cell count, unspecified: Secondary | ICD-10-CM

## 2021-10-26 ENCOUNTER — Inpatient Hospital Stay: Payer: Medicare Other

## 2021-10-26 ENCOUNTER — Telehealth: Payer: Self-pay | Admitting: Oncology

## 2021-10-26 ENCOUNTER — Inpatient Hospital Stay: Payer: Medicare Other | Admitting: Internal Medicine

## 2021-10-26 NOTE — Telephone Encounter (Signed)
R/s pt's new hem appt per pt request. Pt is awre of new appt date/time.

## 2021-11-10 NOTE — Progress Notes (Signed)
[facility] Initial Visit:  Patient Care Team: Scheryl Marten, [place] as PCP - General (Internal Medicine) Debara Pickett Nadean Corwin, MD as PCP - Cardiology (Cardiology)  CHIEF COMPLAINTS/PURPOSE OF CONSULTATION:  Oncology History   No history exists.    HISTORY OF PRESENTING ILLNESS: Erika [name] 66 y.o. female is here because of leukocytosis Medical history notable for anemia, bilateral cataracts, basal cell carcinoma nasal tip, dysphagia, colon polyps, hyperlipidemia, hypertension, neuropathy repair of rectocele unknown excision from tip of nose, GERD, sleep apnea CPAP March 24, 2021: WBC 15.0 hemoglobin 13.8 ANC 10.6 September 18, 2021: WBC 15.8 hemoglobin 14.1 MCV 95 platelet count 183; 81 segs 11 lymphs 6 monos 2 eos ANC 12.2 September 25, 2021 WBC 14.3 hemoglobin 13.4 MCV 95 platelet count 139; 84 segs  November 13 2021:  [facility] Has seen medical notes referring to elevated WBC but not sure how long that has been the case She doe not recall being on steroids.  Currently on insulin for management of hyperglycemia.  Morning FSBG's currently have run between 77 to 130.  A previous Hgb A1c was 9.1 Patient is to undergo rotator cuff repair in the near future  Social:  Retired.  Formerly Loss adjuster, chartered.  Married.  No tobacco.  EtOH 2 glasses of wine on the weekend.    [facility] Mother died 66 bronchiectisis Father died 79 cancer Sister alive 73 DM Type II Sister alive 45 DM Type II  Review of Systems  Constitutional:  Negative for appetite change, chills, fatigue and fever.       Has lost weight through dieting over the last 4 yrs  HENT:   Positive for hearing loss. Negative for lump/mass, mouth sores, nosebleeds, sore throat, trouble swallowing and voice change.        Chronic post nasal gtt from sinus problems  Eyes:  Negative for eye problems and icterus.       Vision changes:  None  Respiratory:  Negative for chest tightness, cough,  hemoptysis, shortness of breath and wheezing.        PND:  none Orthopnea:  none DOE:    Cardiovascular:  Positive for palpitations. Negative for chest pain and leg swelling.       PND:  none Orthopnea:  none  Gastrointestinal:  Negative for abdominal distention, abdominal pain, blood in stool, constipation, diarrhea, nausea and vomiting.  Endocrine: Negative for hot flashes.       Cold intolerance:  none Heat intolerance:  none  Genitourinary:  Negative for difficulty urinating, dysuria, frequency, hematuria and nocturia.   Musculoskeletal:  Positive for arthralgias. Negative for back pain, gait problem, myalgias, neck pain and neck stiffness.       Arthralgis of shoulder  Skin:  Positive for rash. Negative for itching and wound.  Neurological:  Negative for dizziness, extremity weakness, gait problem, headaches, light-headedness, numbness, seizures and speech difficulty.  Hematological:  Negative for adenopathy. Bruises/bleeds easily.  Psychiatric/Behavioral:  Negative for sleep disturbance and suicidal ideas. The patient is not nervous/anxious.    MEDICAL HISTORY: Past Medical History:  Diagnosis Date   Anemia    "just related to fibroids"   Anxiety    takes Clonazepam daily as needed   Arthritis    "wrists; neck" (04/10/2013)   Basal cell carcinoma of nasal tip    Cataract    bilateral   Cramping of feet    takes Potassium daily   Dysphagia    History of colon  polyps    Hyperlipidemia    takes Crestor nightly "because I'm a diabetic"   Hypertension    Insomnia    takes Benadryl nightly   Joint pain    Joint swelling    Neuromuscular disorder (HCC)    feet   Neuropathy    both feet   Osteoarthritis of right knee 04/10/2013   Seasonal allergies    takes Omnaris and Claritin daily as needed   SVD (spontaneous vaginal delivery)    x 1   Type II diabetes mellitus ([place])    takes Janumet daily   Urinary frequency     SURGICAL HISTORY: Past Surgical History:   Procedure Laterality Date   ANTERIOR AND POSTERIOR REPAIR N/A 02/06/2014   Procedure: ANTERIOR (CYSTOCELE) AND POSTERIOR REPAIR (RECTOCELE);  Surgeon: Cheri Fowler, MD;  Location: [facility];  Service: Gynecology;  Laterality: N/A;   BONE SPURS Left 1970's   "ankle"   CLOSED REDUCTION NASAL FRACTURE  ~ 2012   COLONOSCOPY     HYSTEROSCOPY WITH RESECTOSCOPE  2004   "w/fibroids removed"   JOINT REPLACEMENT     right knee   MELANOMA EXCISION  2014   "tip of nose"   TONSILLECTOMY  ~ [place] Right 04/10/2013   TOTAL KNEE ARTHROPLASTY Right 04/10/2013   Procedure: TOTAL KNEE ARTHROPLASTY;  Surgeon: Johnny Bridge, MD;  Location: [facility];  Service: Orthopedics;  Laterality: Right;   VAGINAL HYSTERECTOMY N/A 02/06/2014   Procedure: HYSTERECTOMY VAGINAL;  Surgeon: Cheri Fowler, MD;  Location: [facility];  Service: Gynecology;  Laterality: N/A;    SOCIAL HISTORY: Social History   Socioeconomic History   Marital status: Married    Spouse name: Not on file   Number of children: Not on file   Years of education: Not on file   Highest education level: Not on file  Occupational History   Not on file  Tobacco Use   Smoking status: Never   Smokeless tobacco: Never  Substance and Sexual Activity   Alcohol use: Yes    Alcohol/week: 3.0 standard drinks of alcohol    Types: 3 Glasses of wine per week    Comment: socially   Drug use: No   Sexual activity: Yes    Birth control/protection: Post-menopausal  Other Topics Concern   Not on file  Social History Narrative   Not on file   Social Determinants of Health   Financial Resource Strain: Not on file  Food Insecurity: Not on file  Transportation Needs: Not on file  Physical Activity: Not on file  Stress: Not on file  Social Connections: Not on file  Intimate Partner Violence: Not on file    FAMILY HISTORY Family History  Problem Relation Age of Onset   Bronchitis Mother    Liver cancer Father    Diabetes Sister      ALLERGIES:  is allergic to amoxicillin, fluticasone propionate, nsaids, tetanus toxoids, and tramadol hcl.  MEDICATIONS:  Current Outpatient Medications  Medication Sig Dispense Refill   ALPRAZolam (XANAX) 0.25 MG tablet Take 0.25 mg by mouth at bedtime as needed.     ARTIFICIAL TEARS 0.1-0.3 % SOLN Place 2 drops into both eyes 3 (three) times daily.      Calcium Carb-Cholecalciferol 600-800 MG-UNIT TABS Take 1 tablet by mouth 2 (two) times daily.     cetirizine (ZYRTEC) 10 MG tablet Take 10 mg by mouth daily.     DILT-XR 120 MG 24 hr capsule Take 120 mg  by mouth daily.     diphenhydrAMINE (BENADRYL) 25 mg capsule Take 25-50 mg by mouth at bedtime.     ELIQUIS 5 MG TABS tablet Take 5 mg by mouth 2 (two) times daily.     escitalopram (LEXAPRO) 10 MG tablet Take 1 tablet by mouth daily.     fluticasone (FLONASE) 50 MCG/ACT nasal spray Place 1 spray into both nostrils daily.     FREESTYLE LITE test strip 1 each by Other route as needed.     JANUVIA 100 MG tablet Take 100 mg by mouth daily. (Patient not taking: Reported on 06/29/2021)     lisinopril (PRINIVIL,ZESTRIL) 2.5 MG tablet Take 2.5 mg by mouth every morning.      metFORMIN (GLUCOPHAGE-XR) 500 MG 24 hr tablet Take 1,000 mg by mouth 2 (two) times daily.     montelukast (SINGULAIR) 10 MG tablet Take 10 mg by mouth daily.     Omega-3 Fatty Acids (FISH OIL PO) Take 1 tablet by mouth daily.     Potassium Gluconate 550 MG TABS Take 2 tablets by mouth every evening.      rosuvastatin (CRESTOR) 10 MG tablet Take 5 mg by mouth every evening.      tiZANidine (ZANAFLEX) 4 MG tablet Take 4 mg by mouth 2 (two) times daily as needed.     TRULICITY 1.16 FB/9.0XY SOPN Inject into the skin.     No current facility-administered medications for this visit.    PHYSICAL EXAMINATION:  ECOG PERFORMANCE STATUS: 0 - Asymptomatic   There were no vitals filed for this visit.  There were no vitals filed for this visit.   Physical Exam Vitals and  nursing note reviewed.  Constitutional:      Appearance: Normal appearance. She is not toxic-appearing or diaphoretic.     Comments: Here alone.    HENT:     Head: Normocephalic and atraumatic.     Right Ear: External ear normal.     Left Ear: External ear normal.     Nose: Nose normal. No congestion or rhinorrhea.  Eyes:     General: No scleral icterus.    Extraocular Movements: Extraocular movements intact.     Conjunctiva/sclera: Conjunctivae normal.     Pupils: Pupils are equal, round, and reactive to light.  Cardiovascular:     Rate and Rhythm: Normal rate and regular rhythm.     Heart sounds: Normal heart sounds. No murmur heard.    No friction rub. No gallop.  Pulmonary:     Effort: No respiratory distress.     Breath sounds: No stridor. No wheezing, rhonchi or rales.  Chest:     Chest wall: No tenderness.  Abdominal:     General: Bowel sounds are normal. There is no distension.     Palpations: Abdomen is soft. There is no mass.     Tenderness: There is no abdominal tenderness.     Hernia: No hernia is present.  Musculoskeletal:        General: No swelling, tenderness or deformity.     Cervical back: Normal range of motion and neck supple. No rigidity or tenderness.  Lymphadenopathy:     Head:     Right side of head: No submental, submandibular, tonsillar, preauricular, posterior auricular or occipital adenopathy.     Left side of head: No submental, submandibular, tonsillar, preauricular, posterior auricular or occipital adenopathy.     Cervical: No cervical adenopathy.     Right cervical: No superficial, deep or posterior cervical  adenopathy.    Left cervical: No superficial, deep or posterior cervical adenopathy.     Upper Body:     Right upper body: No supraclavicular, axillary, pectoral or epitrochlear adenopathy.     Left upper body: No supraclavicular, axillary, pectoral or epitrochlear adenopathy.  Skin:    General: Skin is warm.     Coloration: Skin is not  jaundiced or pale.     Findings: No bruising, erythema or rash.  Neurological:     General: No focal deficit present.     Mental Status: She is alert and oriented to person, place, and time. Mental status is at baseline.     Cranial Nerves: No cranial nerve deficit.     Motor: No weakness.     Gait: Gait normal.  Psychiatric:        Mood and Affect: Mood normal.        Behavior: Behavior normal.        Thought Content: Thought content normal.        Judgment: Judgment normal.     LABORATORY DATA: I have personally reviewed the data as listed:  No visits with results within 1 Month(s) from this visit.  Latest known visit with results is:  Appointment on 11/02/2019  Component Date Value Ref Range Status   S' Lateral 11/02/2019 3.00  cm Final    RADIOGRAPHIC STUDIES: I have personally reviewed the radiological images as listed and agree with the findings in the report  No results found.  ASSESSMENT/PLAN  Main Causes of Neutrophilia Acute infections Bacterial Various pyogenic cocci, Escherichia coli, Pseudomonas aeruginosa, Corynebacterium diphtheriae, Francisella tularensis   Spirochaetal Syphilis, Leptospirosis   Rickettsial Typhus, Rocky [place] spotted fever   Chlamydial psittacosis   Protozoal Pneumocystis carinii infection   Mycotic actinomycosis, coccidioidomycosis   Helmenthic liver fluke, filariasis  Acute Inflammation not caused by infection   RA, rheumatic fever, vasculitis, myositis, hypersensitivity reactions.  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  Review medications.     Cancer Staging  No matching staging information was found for the patient.   No problem-specific Assessment & Plan notes found for this encounter.   No orders of the defined types were placed in this encounter.   All questions were answered. The patient knows to call the clinic with any problems, questions or concerns.  This note was electronically signed.    Barbee Cough, MD  11/10/2021 2:00 PM

## 2021-11-13 ENCOUNTER — Inpatient Hospital Stay: Payer: Medicare Other | Attending: Internal Medicine | Admitting: Oncology

## 2021-11-13 ENCOUNTER — Other Ambulatory Visit: Payer: Self-pay

## 2021-11-13 ENCOUNTER — Inpatient Hospital Stay: Payer: Medicare Other

## 2021-11-13 VITALS — BP 100/54 | HR 69 | Temp 97.7°F | Wt 148.3 lb

## 2021-11-13 DIAGNOSIS — D72829 Elevated white blood cell count, unspecified: Secondary | ICD-10-CM

## 2021-11-13 DIAGNOSIS — E1165 Type 2 diabetes mellitus with hyperglycemia: Secondary | ICD-10-CM

## 2021-11-13 DIAGNOSIS — Z794 Long term (current) use of insulin: Secondary | ICD-10-CM

## 2021-11-13 DIAGNOSIS — D72828 Other elevated white blood cell count: Secondary | ICD-10-CM

## 2021-11-13 DIAGNOSIS — I10 Essential (primary) hypertension: Secondary | ICD-10-CM

## 2021-11-13 DIAGNOSIS — Z800 Family history of malignant neoplasm of digestive organs: Secondary | ICD-10-CM

## 2021-11-13 LAB — CBC WITH DIFFERENTIAL (CANCER CENTER ONLY)
Abs Immature Granulocytes: 0.04 10*3/uL (ref 0.00–0.07)
Basophils Absolute: 0 10*3/uL (ref 0.0–0.1)
Basophils Relative: 0 %
Eosinophils Absolute: 0.1 10*3/uL (ref 0.0–0.5)
Eosinophils Relative: 1 %
HCT: 41.6 % (ref 36.0–46.0)
Hemoglobin: 14.2 g/dL (ref 12.0–15.0)
Immature Granulocytes: 0 %
Lymphocytes Relative: 16 %
Lymphs Abs: 1.8 10*3/uL (ref 0.7–4.0)
MCH: 32.3 pg (ref 26.0–34.0)
MCHC: 34.1 g/dL (ref 30.0–36.0)
MCV: 94.8 fL (ref 80.0–100.0)
Monocytes Absolute: 0.8 10*3/uL (ref 0.1–1.0)
Monocytes Relative: 7 %
Neutro Abs: 8.5 10*3/uL — ABNORMAL HIGH (ref 1.7–7.7)
Neutrophils Relative %: 76 %
Platelet Count: 177 10*3/uL (ref 150–400)
RBC: 4.39 MIL/uL (ref 3.87–5.11)
RDW: 12.8 % (ref 11.5–15.5)
WBC Count: 11.3 10*3/uL — ABNORMAL HIGH (ref 4.0–10.5)
nRBC: 0 % (ref 0.0–0.2)

## 2021-11-13 LAB — CMP (CANCER CENTER ONLY)
ALT: 12 U/L (ref 0–44)
AST: 11 U/L — ABNORMAL LOW (ref 15–41)
Albumin: 4.3 g/dL (ref 3.5–5.0)
Alkaline Phosphatase: 74 U/L (ref 38–126)
Anion gap: 5 (ref 5–15)
BUN: 16 mg/dL (ref 8–23)
CO2: 31 mmol/L (ref 22–32)
Calcium: 9.6 mg/dL (ref 8.9–10.3)
Chloride: 103 mmol/L (ref 98–111)
Creatinine: 0.52 mg/dL (ref 0.44–1.00)
GFR, Estimated: >60 mL/min (ref >60)
Glucose, Bld: 132 mg/dL — ABNORMAL HIGH (ref 70–99)
Potassium: 3.9 mmol/L (ref 3.5–5.1)
Sodium: 139 mmol/L (ref 135–145)
Total Bilirubin: 0.6 mg/dL (ref 0.3–1.2)
Total Protein: 7.6 g/dL (ref 6.5–8.1)

## 2021-11-13 LAB — SEDIMENTATION RATE: Sed Rate: 16 mm/hr (ref 0–22)

## 2021-11-13 LAB — C-REACTIVE PROTEIN: CRP: 0.5 mg/dL (ref <1.0)

## 2021-11-13 LAB — TSH: TSH: 1.982 u[IU]/mL (ref 0.350–4.500)

## 2021-11-13 LAB — LACTATE DEHYDROGENASE: LDH: 112 U/L (ref 98–192)

## 2021-11-13 LAB — T4, FREE: Free T4: 1.06 ng/dL (ref 0.61–1.12)

## 2021-11-13 LAB — CORTISOL: Cortisol, Plasma: 9.4 ug/dL

## 2021-11-13 LAB — VITAMIN B12: Vitamin B-12: 488 pg/mL (ref 180–914)

## 2021-11-13 LAB — FOLATE: Folate: 7.2 ng/mL (ref >5.9)

## 2021-11-14 LAB — RHEUMATOID FACTOR: Rheumatoid fact SerPl-aCnc: <10 IU/mL (ref <14.0)

## 2021-11-17 ENCOUNTER — Telehealth: Payer: Self-pay | Admitting: Oncology

## 2021-11-17 NOTE — Telephone Encounter (Signed)
Scheduled appt per 10/6 los. Pt is aware.

## 2021-11-18 LAB — BCR ABL1 FISH (GENPATH)

## 2021-11-18 LAB — JAK2 (INCLUDING V617F AND EXON 12), MPL,& CALR W/RFL MPN PANEL (NGS)

## 2021-11-25 NOTE — Progress Notes (Unsigned)
[facility] Initial Visit:  Patient Care Team: Scheryl Marten, [place] as PCP - General (Internal Medicine) Debara Pickett Nadean Corwin, MD as PCP - Cardiology (Cardiology)  CHIEF COMPLAINTS/PURPOSE OF CONSULTATION:  Oncology History   No history exists.    HISTORY OF PRESENTING ILLNESS: Erika [name] 66 y.o. female is here because of leukocytosis Medical history notable for anemia, bilateral cataracts, basal cell carcinoma nasal tip, dysphagia, colon polyps, hyperlipidemia, hypertension, neuropathy repair of rectocele unknown excision from tip of nose, GERD, sleep apnea CPAP March 24, 2021: WBC 15.0 hemoglobin 13.8 ANC 10.6 September 18, 2021: WBC 15.8 hemoglobin 14.1 MCV 95 platelet count 183; 81 segs 11 lymphs 6 monos [name] eos ANC 12.[name] September 25, 2021 WBC 14.3 hemoglobin 13.4 MCV 95 platelet count 139; 84 segs  November 13 2021:  [facility] Has seen medical notes referring to elevated WBC but not sure how long that has been the case She doe not recall being on steroids.  Currently on insulin for management of hyperglycemia.  Morning FSBG's currently have run between 77 to 130.  A previous Hgb A1c was 9.1 Patient is to undergo rotator cuff repair in the near future  Social:  Retired.  Formerly Loss adjuster, chartered.  Married.  No tobacco.  EtOH [name] glasses of wine on the weekend.    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 This variant has not been reported in publicly available databases ([place]; [employer]) or in the literature as either a somatic variant in tumors or as a population variant. Due to the lack of clinical and functional evidence regarding this variant, its clinical significance is currently unclear. The colony stimulating factor 3 receptor (granulocyte) gene, or CSF3R, is located on chromosome 1p34.3 and encodes a cytokine required for granulocyte maturation and function. Acquired mutations in CSF3R have been reported in myeloid malignancies, including chronic neutrophilic leukemia (CNL), atypical chronic myeloid leukemia (aCML), and, rarely, acute myeloid leukemia (AML). In CNL, oncogenic CSF3R mutations have been reported in over 80% of patients (These occur in Exon 14 generally)  The VAF of 49% raises the question of this being an inherited gene anomaly.    November 27 2021:  Scheduled follow up for granulocytosis  Review of Systems  Constitutional:  Negative for appetite change, chills, fatigue and fever.       Has lost weight through dieting over the last 4 yrs  HENT:   Positive for hearing loss. Negative for lump/mass, mouth sores, nosebleeds, sore throat, trouble swallowing and voice change.        Chronic post nasal gtt from sinus problems  Eyes:  Negative for eye problems and icterus.       Vision changes:  None  Respiratory:  Negative for chest tightness, cough, hemoptysis, shortness of breath and wheezing.        PND:  none Orthopnea:  none DOE:    Cardiovascular:  Positive for palpitations. Negative for chest pain and leg swelling.       PND:  none Orthopnea:  none  Gastrointestinal:  Negative for abdominal distention, abdominal pain, blood in stool, constipation, diarrhea, nausea and vomiting.  Endocrine: Negative for hot flashes.       Cold intolerance:  none Heat intolerance:  none  Genitourinary:  Negative for difficulty urinating, dysuria, frequency,  hematuria and nocturia.   Musculoskeletal:  Positive for arthralgias. Negative for back pain, gait problem, myalgias, neck pain and neck stiffness.       Arthralgis of shoulder  Skin:  Positive for rash. Negative for itching and wound.  Neurological:  Negative for dizziness, extremity weakness, gait problem, headaches, light-headedness, numbness, seizures and speech difficulty.  Hematological:  Negative for adenopathy. Bruises/bleeds easily.  Psychiatric/Behavioral:  Negative for sleep disturbance and suicidal ideas. The patient is not nervous/anxious.    MEDICAL HISTORY: Past Medical History:  Diagnosis Date   Anemia    "just related to fibroids"   Anxiety    takes Clonazepam daily as needed   Arthritis    "wrists; neck" (04/10/2013)   Basal cell carcinoma of nasal tip    Cataract    bilateral   Cramping of feet    takes Potassium daily   Dysphagia    History of colon polyps    Hyperlipidemia    takes Crestor nightly "because I'm a diabetic"   Hypertension    Insomnia    takes Benadryl nightly   Joint pain    Joint swelling    Neuromuscular disorder ([place])    feet   Neuropathy    both feet   Osteoarthritis of right knee 04/10/2013   Seasonal allergies    takes Omnaris and Claritin daily as needed   SVD (spontaneous vaginal delivery)    x 1   Type II diabetes mellitus ([place])    takes Janumet daily   Urinary frequency     SURGICAL HISTORY: Past Surgical History:  Procedure Laterality Date   ANTERIOR AND POSTERIOR REPAIR N/A 02/06/2014   Procedure: ANTERIOR (CYSTOCELE) AND POSTERIOR REPAIR (RECTOCELE);  Surgeon: Cheri Fowler, MD;  Location: [facility];  Service: Gynecology;  Laterality: N/A;   BONE SPURS Left 1970's   "ankle"   CLOSED REDUCTION NASAL FRACTURE  ~ 2012   COLONOSCOPY     HYSTEROSCOPY WITH RESECTOSCOPE  2004   "w/fibroids removed"   JOINT REPLACEMENT     right knee   MELANOMA EXCISION  2014   "tip of nose"   TONSILLECTOMY  ~ [place] Right 04/10/2013   TOTAL KNEE ARTHROPLASTY Right 04/10/2013   Procedure: TOTAL KNEE ARTHROPLASTY;  Surgeon: Johnny Bridge, MD;  Location: [facility];  Service: Orthopedics;  Laterality: Right;   VAGINAL HYSTERECTOMY N/A 02/06/2014   Procedure: HYSTERECTOMY VAGINAL;  Surgeon: Cheri Fowler, MD;  Location: [facility];  Service: Gynecology;  Laterality: N/A;    SOCIAL HISTORY: Social History   Socioeconomic History   Marital status: Married    Spouse name: Not on file   Number of children: Not on file   Years of education: Not on file   Highest education level: Not on file  Occupational History   Not on file  Tobacco Use   Smoking status: Never   Smokeless tobacco: Never  Substance and Sexual Activity   Alcohol use: Yes    Alcohol/week: 3.0 standard drinks of alcohol    Types: 3 Glasses of wine per week  Comment: socially   Drug use: No   Sexual activity: Yes    Birth control/protection: Post-menopausal  Other Topics Concern   Not on file  Social History Narrative   Not on file   Social Determinants of Health   Financial Resource Strain: Not on file  Food Insecurity: Not on file  Transportation Needs: Not on file  Physical Activity: Not on file  Stress: Not on file  Social Connections: Not on file  Intimate Partner Violence: Not on file    FAMILY HISTORY Family History  Problem Relation Age of Onset   Bronchitis Mother    Liver cancer Father    Diabetes Sister     ALLERGIES:  is allergic to amoxicillin, fluticasone propionate, nsaids, tetanus toxoids, and tramadol hcl.  MEDICATIONS:  Current Outpatient Medications  Medication Sig Dispense Refill   ALPRAZolam (XANAX) 0.25 MG tablet Take 0.25 mg by mouth at bedtime as needed.     ARTIFICIAL TEARS 0.1-0.3 % SOLN Place [name] drops into both eyes 3 (three) times daily.      Calcium Carb-Cholecalciferol 600-800 MG-UNIT TABS Take 1 tablet by mouth [name] (two) times daily.     cetirizine (ZYRTEC) 10 MG tablet Take 10 mg  by mouth daily.     DILT-XR 120 MG 24 hr capsule Take 120 mg by mouth daily.     diphenhydrAMINE (BENADRYL) 25 mg capsule Take 25-50 mg by mouth at bedtime.     ELIQUIS 5 MG TABS tablet Take 5 mg by mouth [name] (two) times daily.     escitalopram (LEXAPRO) 10 MG tablet Take 1 tablet by mouth daily.     fluticasone (FLONASE) 50 MCG/ACT nasal spray Place 1 spray into both nostrils daily.     FREESTYLE LITE test strip 1 each by Other route as needed.     insulin glargine, 1 Unit Dial, (TOUJEO SOLOSTAR) 300 UNIT/ML Solostar Pen Inject into the skin.     lisinopril (PRINIVIL,ZESTRIL) [name].5 MG tablet Take [name].5 mg by mouth every morning.      metFORMIN (GLUCOPHAGE-XR) 500 MG 24 hr tablet Take 1,000 mg by mouth [name] (two) times daily.     montelukast (SINGULAIR) 10 MG tablet Take 10 mg by mouth daily.     Omega-3 Fatty Acids (FISH OIL PO) Take 1 tablet by mouth daily.     Potassium Gluconate 550 MG TABS Take [name] tablets by mouth every evening.      rosuvastatin (CRESTOR) 10 MG tablet Take 5 mg by mouth every evening.      TRULICITY 3 NT/6.1WE SOPN SMARTSIG:3 Milligram(s) SUB-Q Once a Week     No current facility-administered medications for this visit.    PHYSICAL EXAMINATION:  ECOG PERFORMANCE STATUS: 0 - Asymptomatic   There were no vitals filed for this visit.  There were no vitals filed for this visit.   Physical Exam Vitals and nursing note reviewed.  Constitutional:      Appearance: Normal appearance. She is not toxic-appearing or diaphoretic.     Comments: Here alone.    HENT:     Head: Normocephalic and atraumatic.     Right Ear: External ear normal.     Left Ear: External ear normal.     Nose: Nose normal. No congestion or rhinorrhea.  Eyes:     General: No scleral icterus.    Extraocular Movements: Extraocular movements intact.     Conjunctiva/sclera: Conjunctivae normal.     Pupils: Pupils are equal, round, and reactive to light.  Cardiovascular:  Rate and Rhythm: Normal rate  and regular rhythm.     Heart sounds: Normal heart sounds. No murmur heard.    No friction rub. No gallop.  Pulmonary:     Effort: No respiratory distress.     Breath sounds: No stridor. No wheezing, rhonchi or rales.  Chest:     Chest wall: No tenderness.  Abdominal:     General: Bowel sounds are normal. There is no distension.     Palpations: Abdomen is soft. There is no mass.     Tenderness: There is no abdominal tenderness.  Musculoskeletal:        General: No swelling, tenderness or deformity.     Cervical back: Normal range of motion and neck supple. No rigidity or tenderness.  Lymphadenopathy:     Head:     Right side of head: No submental, submandibular, tonsillar, preauricular, posterior auricular or occipital adenopathy.     Left side of head: No submental, submandibular, tonsillar, preauricular, posterior auricular or occipital adenopathy.     Cervical: No cervical adenopathy.     Right cervical: No superficial, deep or posterior cervical adenopathy.    Left cervical: No superficial, deep or posterior cervical adenopathy.     Upper Body:     Right upper body: No supraclavicular, axillary, pectoral or epitrochlear adenopathy.     Left upper body: No supraclavicular, axillary, pectoral or epitrochlear adenopathy.  Skin:    General: Skin is warm.     Coloration: Skin is not jaundiced or pale.     Findings: No bruising, erythema or rash.  Neurological:     General: No focal deficit present.     Mental Status: She is alert and oriented to person, place, and time. Mental status is at baseline.     Cranial Nerves: No cranial nerve deficit.     Motor: No weakness.     Gait: Gait normal.  Psychiatric:        Mood and Affect: Mood normal.        Behavior: Behavior normal.        Thought Content: Thought content normal.        Judgment: Judgment normal.     LABORATORY DATA: I have personally reviewed the data as listed:  Appointment on 11/13/2021  Component Date Value  Ref Range Status   Free T4 11/13/2021 1.06  0.61 - 1.12 ng/dL Final   Comment: (NOTE) Biotin ingestion may interfere with free T4 tests. If the results are inconsistent with the TSH level, previous test results, or the clinical presentation, then consider biotin interference. If needed, order repeat testing after stopping biotin. Performed at [facility], [place] [address] [place], [place] [address]    TSH 11/13/2021 1.982  0.350 - 4.500 uIU/mL Final   Comment: Performed by a 3rd Generation assay with a functional sensitivity of <=0.01 uIU/mL. Performed at [facility], [address] [place], [place] [address]    CRP 11/13/2021 0.5  <1.0 mg/dL Final   Performed at [facility] [address] [place], [place] [address]   Sed Rate 11/13/2021 16  0 - 22 mm/hr Final   Performed at [facility], [place] [address] [place], [place] [address]   Cortisol, Plasma 11/13/2021 9.4  ug/dL Final   Comment: (NOTE) AM    6.7 - 22.6 ug/dL PM   <10.0       ug/dL Performed at [facility] [address], [place], [place]  [address]    Erika [name], Erika [name], Erika [name], Erika [name] 11/13/2021 See Scanned report in [place]   Final   Performed at [facility], [place] [address], [place], [place] [address]   Sodium 11/13/2021 139  135 - 145 mmol/L Final   Potassium 11/13/2021 3.9  3.5 - 5.1 mmol/L Final   Chloride 11/13/2021 103  98 - 111 mmol/L Final   CO2 11/13/2021 31  22 - 32 mmol/L Final   Glucose, Bld 11/13/2021 132 (H)  70 - 99 mg/dL Final   Glucose reference range applies only to samples taken after fasting for at least 8 hours.   BUN 11/13/2021 16  8 - 23 mg/dL Final   Creatinine 11/13/2021 0.52  0.44 - 1.00 mg/dL Final   Calcium 11/13/2021 9.6  8.9 - 10.3 mg/dL Final   Total Protein 11/13/2021 7.6  6.5 - 8.1 g/dL Final   Albumin 11/13/2021 4.3  3.5 - 5.0 g/dL Final   AST 11/13/2021 11 (L)  15 - 41 U/L Final   ALT 11/13/2021 12   0 - 44 U/L Final   Alkaline Phosphatase 11/13/2021 74  38 - 126 U/L Final   Total Bilirubin 11/13/2021 0.6  0.3 - 1.[name] mg/dL Final   GFR, Estimated 11/13/2021 >60  >60 mL/min Final   Comment: (NOTE) Calculated using the CKD-EPI Creatinine Equation (2021)    Anion gap 11/13/2021 5  5 - 15 Final   Performed at [facility], [place] [address], [place], [place] [address]   WBC Count 11/13/2021 11.3 (H)  4.0 - 10.5 K/uL Final   RBC 11/13/2021 4.39  3.87 - 5.11 MIL/uL Final   Hemoglobin 11/13/2021 14.[name]  12.0 - 15.0 g/dL Final   HCT 11/13/2021 41.6  36.0 - 46.0 % Final   MCV 11/13/2021 94.8  80.0 - 100.0 fL Final   MCH 11/13/2021 32.3  26.0 - 34.0 pg Final   MCHC 11/13/2021 34.1  30.0 - 36.0 g/dL Final   RDW 11/13/2021 12.8  11.5 - 15.5 % Final   Platelet Count 11/13/2021 177  150 - 400 K/uL Final   nRBC 11/13/2021 0.0  0.0 - 0.[name] % Final   Neutrophils Relative % 11/13/2021 76  % Final   Neutro Abs 11/13/2021 8.5 (H)  1.7 - 7.7 K/uL Final   Lymphocytes Relative 11/13/2021 16  % Final   Lymphs Abs 11/13/2021 1.8  0.7 - 4.0 K/uL Final   Monocytes Relative 11/13/2021 7  % Final   Monocytes Absolute 11/13/2021 0.8  0.1 - 1.0 K/uL Final   Eosinophils Relative 11/13/2021 1  % Final   Eosinophils Absolute 11/13/2021 0.1  0.0 - 0.5 K/uL Final   Basophils Relative 11/13/2021 0  % Final   Basophils Absolute 11/13/2021 0.0  0.0 - 0.1 K/uL Final   Immature Granulocytes 11/13/2021 0  % Final   Abs Immature Granulocytes 11/13/2021 0.04  0.00 - 0.07 K/uL Final   Performed at [facility], [place] [address], [place], [place] [address]   Rhuematoid fact SerPl-aCnc 11/13/2021 <10.0  <14.0 IU/mL Final   Comment: (NOTE) Performed At: [facility] [place], [place] [id number] Rush Farmer MD [id number]    Vitamin B-12 11/13/2021 488  180 - 914 pg/mL Final   Comment: (NOTE) This assay is not validated for testing neonatal  or myeloproliferative syndrome specimens for Vitamin B12 levels. Performed at [facility], [place] [address], [place], [place] [address]  LDH 11/13/2021 112  98 - 192 U/L Final   Performed at [facility], [place] [address], [place], [place] [address]   Folate 11/13/2021 7.[name]  >5.9 ng/mL Final   Performed at [facility] [address], [place], [place] [address]  Office Visit on 11/13/2021  Component Date Value Ref Range Status   BCR ABL1 / ABL1 11/13/2021 See Scanned report in [place]   Final   Performed at [facility], [address], [place], [place] [address]    RADIOGRAPHIC STUDIES: I have personally reviewed the radiological images as listed and agree with the findings in the report  No results found.  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  Possible that the CSF3R anomaly is contributory Follow up in 6 months.  CBC with diff, TSH/FT4 on return.    Cancer Staging  No matching staging information was found for the patient.   No problem-specific Assessment & Plan notes found for this encounter.   No orders of the defined types were placed in this encounter.   All questions were answered. The patient knows to call the clinic with any problems, questions or concerns.  This note was electronically signed.    Barbee Cough, MD  11/25/2021 12:42 PM

## 2021-11-27 ENCOUNTER — Inpatient Hospital Stay (HOSPITAL_BASED_OUTPATIENT_CLINIC_OR_DEPARTMENT_OTHER): Payer: Medicare Other | Admitting: Oncology

## 2021-11-27 VITALS — BP 111/52 | HR 78 | Temp 98.6°F | Resp 15 | Wt 152.3 lb

## 2021-11-27 DIAGNOSIS — E1142 Type 2 diabetes mellitus with diabetic polyneuropathy: Secondary | ICD-10-CM

## 2021-11-27 DIAGNOSIS — D72829 Elevated white blood cell count, unspecified: Secondary | ICD-10-CM | POA: Diagnosis not present

## 2021-11-27 DIAGNOSIS — E1165 Type 2 diabetes mellitus with hyperglycemia: Secondary | ICD-10-CM

## 2021-11-27 DIAGNOSIS — Z7189 Other specified counseling: Secondary | ICD-10-CM | POA: Diagnosis not present

## 2021-11-27 DIAGNOSIS — D72828 Other elevated white blood cell count: Secondary | ICD-10-CM

## 2021-11-30 ENCOUNTER — Telehealth: Payer: Self-pay | Admitting: Oncology

## 2021-11-30 NOTE — Telephone Encounter (Signed)
Scheduled appt per 10/20 los. Pt is aware.

## 2021-12-03 DIAGNOSIS — Z7189 Other specified counseling: Secondary | ICD-10-CM | POA: Insufficient documentation

## 2022-06-03 ENCOUNTER — Other Ambulatory Visit: Payer: Self-pay | Admitting: Oncology

## 2022-06-03 DIAGNOSIS — D72828 Other elevated white blood cell count: Secondary | ICD-10-CM

## 2022-06-04 ENCOUNTER — Other Ambulatory Visit: Payer: Medicare Other

## 2022-06-04 ENCOUNTER — Telehealth: Payer: Self-pay | Admitting: Oncology

## 2022-06-04 ENCOUNTER — Inpatient Hospital Stay: Payer: Medicare Other | Admitting: Oncology

## 2022-06-04 DIAGNOSIS — D72828 Other elevated white blood cell count: Secondary | ICD-10-CM

## 2022-06-04 NOTE — Telephone Encounter (Signed)
Patient called to cancel todays appointment/ Reschedule, patient is aware of date and time change.

## 2022-06-10 ENCOUNTER — Other Ambulatory Visit (HOSPITAL_COMMUNITY): Payer: Self-pay

## 2022-06-11 ENCOUNTER — Other Ambulatory Visit (HOSPITAL_COMMUNITY): Payer: Self-pay

## 2022-06-11 MED ORDER — TRULICITY 3 MG/0.5ML ~~LOC~~ SOAJ
3.0000 mg | SUBCUTANEOUS | 2 refills | Status: DC
  Filled 2022-06-11: qty 2, 28d supply, fill #0
  Filled 2022-07-02: qty 2, 28d supply, fill #1

## 2022-07-02 ENCOUNTER — Other Ambulatory Visit (HOSPITAL_COMMUNITY): Payer: Self-pay

## 2022-07-08 ENCOUNTER — Other Ambulatory Visit: Payer: Self-pay | Admitting: Oncology

## 2022-07-08 DIAGNOSIS — D72828 Other elevated white blood cell count: Secondary | ICD-10-CM

## 2022-07-08 NOTE — Progress Notes (Signed)
[facility] Cancer Follow up Visit:  Patient Care Team: Collene Mares, [place] as PCP - General (Internal Medicine) Rennis Golden Lisette Abu, MD as PCP - Cardiology (Cardiology)  CHIEF COMPLAINTS/PURPOSE OF CONSULTATION:  HISTORY OF PRESENTING ILLNESS: Erika [name] 67 y.o. female is here because of leukocytosis Medical history notable for anemia, bilateral cataracts, basal cell carcinoma nasal tip, dysphagia, colon polyps, hyperlipidemia, hypertension, neuropathy repair of rectocele unknown excision from tip of nose, GERD, sleep apnea CPAP March 24, 2021: WBC 15.0 hemoglobin 13.8 ANC 10.6 September 18, 2021: WBC 15.8 hemoglobin 14.1 MCV 95 platelet count 183; 81 segs 11 lymphs 6 monos 2 eos ANC 12.2 September 25, 2021 WBC 14.3 hemoglobin 13.4 MCV 95 platelet count 139; 84 segs  November 13 2021:  [facility] Has seen medical notes referring to elevated WBC but not sure how long that has been the case She doe not recall being on steroids.  Currently on insulin for management of hyperglycemia.  Morning FSBG's currently have run between 77 to 130.  A previous Hgb A1c was 9.1 Patient is to undergo rotator cuff repair in the near future  Social:  Retired.  Formerly Therapist, occupational.  Married.  No tobacco.  EtOH 2 glasses of wine on the weekend.    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 This variant has  not been reported in publicly available databases (COSMIC; gnomAD) or in the literature as either a somatic variant in tumors or as a population variant. Due to the lack of clinical and functional evidence regarding this variant, its clinical significance is currently unclear.  The colony stimulating factor 3 receptor (granulocyte) gene, or CSF3R, is located on chromosome 1p34.3 and encodes a cytokine required for granulocyte maturation and function.  Acquired mutations in CSF3R have been reported in myeloid malignancies, including chronic neutrophilic leukemia (CNL), atypical chronic myeloid leukemia (aCML), and, rarely, acute myeloid leukemia (AML). In CNL, oncogenic CSF3R mutations have been reported in over 80% of patients (These occur in Exon 14 generally)  The VAF of 49% raises the question of this being an inherited gene anomaly.    November 27 2021:  Scheduled follow up for granulocytosis  Reviewed results of labs with patient and discussed the implications    Jul 09 2022:  Scheduled follow up for granulocytosis.  Busy walking dogs with her daughter  Review of Systems  Constitutional:  Negative for appetite change, chills, fatigue and fever.       Has lost weight through dieting over the last 4 yrs  HENT:   Positive for hearing loss. Negative for lump/mass, mouth sores, nosebleeds, sore throat, trouble swallowing and voice change.        Chronic post nasal gtt from sinus problems  Eyes:  Negative for eye problems and icterus.       Vision changes:  None  Respiratory:  Negative for chest tightness, cough, hemoptysis,  shortness of breath and wheezing.        PND:  none Orthopnea:  none DOE:    Cardiovascular:  Positive for palpitations. Negative for chest pain and leg swelling.       PND:  none Orthopnea:  none  Gastrointestinal:  Negative for abdominal distention, abdominal pain, blood in stool, constipation, diarrhea, nausea and vomiting.  Endocrine: Negative for hot flashes.       Cold  intolerance:  none Heat intolerance:  none  Genitourinary:  Negative for difficulty urinating, dysuria, frequency, hematuria and nocturia.   Musculoskeletal:  Positive for arthralgias. Negative for back pain, gait problem, myalgias, neck pain and neck stiffness.       Arthralgis of shoulder  Skin:  Positive for rash. Negative for itching and wound.  Neurological:  Negative for dizziness, extremity weakness, gait problem, headaches, light-headedness, numbness, seizures and speech difficulty.  Hematological:  Negative for adenopathy. Bruises/bleeds easily.  Psychiatric/Behavioral:  Negative for sleep disturbance and suicidal ideas. The patient is not nervous/anxious.     MEDICAL HISTORY: Past Medical History:  Diagnosis Date   Anemia    "just related to fibroids"   Anxiety    takes Clonazepam daily as needed   Arthritis    "wrists; neck" (04/10/2013)   Basal cell carcinoma of nasal tip    Cataract    bilateral   Cramping of feet    takes Potassium daily   Dysphagia    History of colon polyps    Hyperlipidemia    takes Crestor nightly "because I'm a diabetic"   Hypertension    Insomnia    takes Benadryl nightly   Joint pain    Joint swelling    Neuromuscular disorder (HCC)    feet   Neuropathy    both feet   Osteoarthritis of right knee 04/10/2013   Seasonal allergies    takes Omnaris and Claritin daily as needed   SVD (spontaneous vaginal delivery)    x 1   Type II diabetes mellitus (HCC)    takes Janumet daily   Urinary frequency     SURGICAL HISTORY: Past Surgical History:  Procedure Laterality Date   ANTERIOR AND POSTERIOR REPAIR N/A 02/06/2014   Procedure: ANTERIOR (CYSTOCELE) AND POSTERIOR REPAIR (RECTOCELE);  Surgeon: Lavina Hamman, MD;  Location: [facility];  Service: Gynecology;  Laterality: N/A;   BONE SPURS Left 1970's   "ankle"   CLOSED REDUCTION NASAL FRACTURE  ~ 2012   COLONOSCOPY     HYSTEROSCOPY WITH RESECTOSCOPE  2004   "w/fibroids removed"   JOINT  REPLACEMENT     right knee   MELANOMA EXCISION  2014   "tip of nose"   TONSILLECTOMY  ~ 1959   TOTAL KNEE ARTHROPLASTY Right 04/10/2013   TOTAL KNEE ARTHROPLASTY Right 04/10/2013   Procedure: TOTAL KNEE ARTHROPLASTY;  Surgeon: Eulas Post, MD;  Location: [facility];  Service: Orthopedics;  Laterality: Right;   VAGINAL HYSTERECTOMY N/A 02/06/2014   Procedure: HYSTERECTOMY VAGINAL;  Surgeon: Lavina Hamman, MD;  Location: [facility];  Service: Gynecology;  Laterality: N/A;    SOCIAL HISTORY: Social History   Socioeconomic History   Marital status: Married    Spouse name: Not on file   Number of children: Not on file   Years of education: Not on file   Highest education level: Not on file  Occupational History   Not on file  Tobacco Use   Smoking status: Never   Smokeless tobacco: Never  Substance and Sexual Activity  Alcohol use: Yes    Alcohol/week: 3.0 standard drinks of alcohol    Types: 3 Glasses of wine per week    Comment: socially   Drug use: No   Sexual activity: Yes    Birth control/protection: Post-menopausal  Other Topics Concern   Not on file  Social History Narrative   Not on file   Social Determinants of Health   Financial Resource Strain: Not on file  Food Insecurity: Not on file  Transportation Needs: Not on file  Physical Activity: Not on file  Stress: Not on file  Social Connections: Not on file  Intimate Partner Violence: Not on file    FAMILY HISTORY Family History  Problem Relation Age of Onset   Bronchitis Mother    Liver cancer Father    Diabetes Sister     ALLERGIES:  is allergic to amoxicillin, fluticasone propionate, nsaids, tetanus toxoids, and tramadol hcl.  MEDICATIONS:  Current Outpatient Medications  Medication Sig Dispense Refill   ALPRAZolam (XANAX) 0.25 MG tablet Take 0.25 mg by mouth at bedtime as needed.     ARTIFICIAL TEARS 0.1-0.3 % SOLN Place 2 drops into both eyes 3 (three) times daily.      Calcium Carb-Cholecalciferol  600-800 MG-UNIT TABS Take 1 tablet by mouth 2 (two) times daily.     cetirizine (ZYRTEC) 10 MG tablet Take 10 mg by mouth daily.     DILT-XR 120 MG 24 hr capsule Take 120 mg by mouth daily.     diphenhydrAMINE (BENADRYL) 25 mg capsule Take 25-50 mg by mouth at bedtime.     Dulaglutide (TRULICITY) 3 MG/0.5ML SOPN Inject 3 mg into the skin once a week. 2 mL 2   ELIQUIS 5 MG TABS tablet Take 5 mg by mouth 2 (two) times daily.     escitalopram (LEXAPRO) 10 MG tablet Take 1 tablet by mouth daily.     fluticasone (FLONASE) 50 MCG/ACT nasal spray Place 1 spray into both nostrils daily.     FREESTYLE LITE test strip 1 each by Other route as needed.     insulin glargine, 1 Unit Dial, (TOUJEO SOLOSTAR) 300 UNIT/ML Solostar Pen Inject into the skin.     lisinopril (PRINIVIL,ZESTRIL) 2.5 MG tablet Take 2.5 mg by mouth every morning.      metFORMIN (GLUCOPHAGE-XR) 500 MG 24 hr tablet Take 1,000 mg by mouth 2 (two) times daily.     montelukast (SINGULAIR) 10 MG tablet Take 10 mg by mouth daily.     Omega-3 Fatty Acids (FISH OIL PO) Take 1 tablet by mouth daily.     Potassium Gluconate 550 MG TABS Take 2 tablets by mouth every evening.      rosuvastatin (CRESTOR) 10 MG tablet Take 5 mg by mouth every evening.      TRULICITY 3 MG/0.5ML SOPN SMARTSIG:3 Milligram(s) SUB-Q Once a Week     No current facility-administered medications for this visit.    PHYSICAL EXAMINATION:  ECOG PERFORMANCE STATUS: 0 - Asymptomatic   There were no vitals filed for this visit.  There were no vitals filed for this visit.   Physical Exam Vitals and nursing note reviewed.  Constitutional:      Appearance: Normal appearance. She is not toxic-appearing or diaphoretic.     Comments: Here alone.    HENT:     Head: Normocephalic and atraumatic.     Right Ear: External ear normal.     Left Ear: External ear normal.     Nose: Nose normal.  No congestion or rhinorrhea.  Eyes:     General: No scleral icterus.     Extraocular Movements: Extraocular movements intact.     Conjunctiva/sclera: Conjunctivae normal.     Pupils: Pupils are equal, round, and reactive to light.  Cardiovascular:     Rate and Rhythm: Normal rate and regular rhythm.     Heart sounds: Normal heart sounds. No murmur heard.    No friction rub. No gallop.  Pulmonary:     Effort: No respiratory distress.     Breath sounds: No stridor. No wheezing, rhonchi or rales.  Chest:     Chest wall: No tenderness.  Abdominal:     General: Bowel sounds are normal. There is no distension.     Palpations: Abdomen is soft. There is no mass.     Tenderness: There is no abdominal tenderness.  Musculoskeletal:        General: No swelling, tenderness or deformity.     Cervical back: Normal range of motion and neck supple. No rigidity or tenderness.  Lymphadenopathy:     Head:     Right side of head: No submental, submandibular, tonsillar, preauricular, posterior auricular or occipital adenopathy.     Left side of head: No submental, submandibular, tonsillar, preauricular, posterior auricular or occipital adenopathy.     Cervical: No cervical adenopathy.     Right cervical: No superficial, deep or posterior cervical adenopathy.    Left cervical: No superficial, deep or posterior cervical adenopathy.     Upper Body:     Right upper body: No supraclavicular, axillary, pectoral or epitrochlear adenopathy.     Left upper body: No supraclavicular, axillary, pectoral or epitrochlear adenopathy.  Skin:    General: Skin is warm.     Coloration: Skin is not jaundiced or pale.     Findings: No bruising, erythema or rash.  Neurological:     General: No focal deficit present.     Mental Status: She is alert and oriented to person, place, and time. Mental status is at baseline.     Cranial Nerves: No cranial nerve deficit.     Motor: No weakness.     Gait: Gait normal.  Psychiatric:        Mood and Affect: Mood normal.        Behavior: Behavior  normal.        Thought Content: Thought content normal.        Judgment: Judgment normal.     LABORATORY DATA: I have personally reviewed the data as listed:  No visits with results within 1 Month(s) from this visit.  Latest known visit with results is:  Appointment on 11/13/2021  Component Date Value Ref Range Status   Free T4 11/13/2021 1.06  0.61 - 1.12 ng/dL Final   Comment: (NOTE) Biotin ingestion may interfere with free T4 tests. If the results are inconsistent with the TSH level, previous test results, or the clinical presentation, then consider biotin interference. If needed, order repeat testing after stopping biotin. Performed at [facility], [address], [place], [place] [address]    TSH 11/13/2021 1.982  0.350 - 4.500 uIU/mL Final   Comment: Performed by a 3rd Generation assay with a functional sensitivity of <=0.01 uIU/mL. Performed at [facility], [address], [place], [place] [address]    CRP 11/13/2021 0.5  <1.0 mg/dL Final   Performed at [facility], [address] [place], [place] [address]   Sed Rate 11/13/2021  16  0 - 22 mm/hr Final   Performed at [facility], [address], [place], [place] [address]   Cortisol, Plasma 11/13/2021 9.4  ug/dL Final   Comment: (NOTE) AM    6.7 - 22.6 ug/dL PM   <25.3       ug/dL Performed at [facility], [address], [place], [place] [address]    JAK 2, MPL, Baldo Ash, [facility] 11/13/2021 See Scanned report in [place] Link   Final   Performed at [facility], [address] [place], [place] [address]   Sodium 11/13/2021 139  135 - 145 mmol/L Final   Potassium 11/13/2021 3.9  3.5 - 5.1 mmol/L Final   Chloride 11/13/2021 103  98 - 111 mmol/L Final   CO2 11/13/2021 31  22 - 32 mmol/L Final   Glucose, Bld 11/13/2021 132 (H)  70 - 99 mg/dL Final   Glucose reference range applies only to samples taken after fasting for at least 8 hours.    BUN 11/13/2021 16  8 - 23 mg/dL Final   Creatinine 59/56/3875 0.52  0.44 - 1.00 mg/dL Final   Calcium 64/33/2951 9.6  8.9 - 10.3 mg/dL Final   Total Protein 88/41/6606 7.6  6.5 - 8.1 g/dL Final   Albumin 30/16/0109 4.3  3.5 - 5.0 g/dL Final   AST 32/35/5732 11 (L)  15 - 41 U/L Final   ALT 11/13/2021 12  0 - 44 U/L Final   Alkaline Phosphatase 11/13/2021 74  38 - 126 U/L Final   Total Bilirubin 11/13/2021 0.6  0.3 - 1.2 mg/dL Final   GFR, Estimated 11/13/2021 >60  >60 mL/min Final   Comment: (NOTE) Calculated using the CKD-EPI Creatinine Equation (2021)    Anion gap 11/13/2021 5  5 - 15 Final   Performed at [facility], [address] [place], [place] [address]   WBC Count 11/13/2021 11.3 (H)  4.0 - 10.5 K/uL Final   RBC 11/13/2021 4.39  3.87 - 5.11 MIL/uL Final   Hemoglobin 11/13/2021 14.2  12.0 - 15.0 g/dL Final   HCT 27/07/2374 41.6  36.0 - 46.0 % Final   MCV 11/13/2021 94.8  80.0 - 100.0 fL Final   MCH 11/13/2021 32.3  26.0 - 34.0 pg Final   MCHC 11/13/2021 34.1  30.0 - 36.0 g/dL Final   RDW 28/31/5176 12.8  11.5 - 15.5 % Final   Platelet Count 11/13/2021 177  150 - 400 K/uL Final   nRBC 11/13/2021 0.0  0.0 - 0.2 % Final   Neutrophils Relative % 11/13/2021 76  % Final   Neutro Abs 11/13/2021 8.5 (H)  1.7 - 7.7 K/uL Final   Lymphocytes Relative 11/13/2021 16  % Final   Lymphs Abs 11/13/2021 1.8  0.7 - 4.0 K/uL Final   Monocytes Relative 11/13/2021 7  % Final   Monocytes Absolute 11/13/2021 0.8  0.1 - 1.0 K/uL Final   Eosinophils Relative 11/13/2021 1  % Final   Eosinophils Absolute 11/13/2021 0.1  0.0 - 0.5 K/uL Final   Basophils Relative 11/13/2021 0  % Final   Basophils Absolute 11/13/2021 0.0  0.0 - 0.1 K/uL Final   Immature Granulocytes 11/13/2021 0  % Final   Abs Immature Granulocytes 11/13/2021 0.04  0.00 - 0.07 K/uL Final   Performed at [facility], [address], [place], [place] [address]   Rheumatoid fact  SerPl-aCnc 11/13/2021 <10.0  <14.0 IU/mL Final  Comment: (NOTE) Performed At: [facility] [address] [place], [place] [id number] Jolene Schimke MD NW:[id number]    Vitamin B-12 11/13/2021 488  180 - 914 pg/mL Final   Comment: (NOTE) This assay is not validated for testing neonatal or myeloproliferative syndrome specimens for Vitamin B12 levels. Performed at [facility], [address] [place], [place] [address]    LDH 11/13/2021 112  98 - 192 U/L Final   Performed at [facility], [address] [place], [place] [address]   Folate 11/13/2021 7.2  >5.9 ng/mL Final   Performed at [facility], [address], [place], [place] [address]    RADIOGRAPHIC STUDIES: I have personally reviewed the radiological images as listed and agree with the findings in the report  No results found.  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    Possible causes of granulocytosis in this patient.    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    CALR; VUS p.Val801Ala (Exon 17) VUF 49% :  Has not been reported in publicly available databases (COSMIC; gnomAD) or in the literature as either a somatic variant in tumors or as a population variant. Due to the lack of clinical and functional evidence regarding this variant, its clinical significance is currently unclear.  The colony stimulating factor 3 receptor (granulocyte) gene, or CSF3R encodes a cytokine required for granulocyte maturation and function.  Acquired mutations in CSF3R have been reported in  myeloid malignancies, including chronic neutrophilic leukemia (CNL), atypical chronic myeloid leukemia (aCML), and, rarely, acute myeloid leukemia (AML). In CNL, oncogenic CSF3R mutations have been reported in over 80% of patients (These occur in Exon 14 generally)  The VAF of 49% raises the question of this being an inherited gene anomaly.     Cancer Staging  No matching staging information was found for the patient.   No problem-specific Assessment & Plan notes found for this encounter.   No orders of the defined types were placed in this encounter.  20 minutes was spent in patient care.  This included time spent preparing to see the patient (e.g., review of tests), obtaining and/or reviewing separately obtained history, counseling and educating the patient/family/caregiver, ordering medications, tests; documenting clinical information in the electronic or other health record, independently interpreting results and communicating results to the patient as well as coordination of care.        All questions were answered. The patient knows to call the clinic with any problems, questions or concerns.  This note was electronically signed.    Loni Muse, MD  07/08/2022 1:34 PM

## 2022-07-09 ENCOUNTER — Inpatient Hospital Stay: Payer: Medicare Other

## 2022-07-09 ENCOUNTER — Other Ambulatory Visit: Payer: Self-pay

## 2022-07-09 ENCOUNTER — Inpatient Hospital Stay: Payer: Medicare Other | Attending: Oncology | Admitting: Oncology

## 2022-07-09 VITALS — BP 137/75 | HR 69 | Temp 97.3°F | Resp 17 | Wt 152.8 lb

## 2022-07-09 DIAGNOSIS — D72829 Elevated white blood cell count, unspecified: Secondary | ICD-10-CM

## 2022-07-09 DIAGNOSIS — D72828 Other elevated white blood cell count: Secondary | ICD-10-CM

## 2022-07-09 DIAGNOSIS — Z1589 Genetic susceptibility to other disease: Secondary | ICD-10-CM

## 2022-07-09 DIAGNOSIS — Z90710 Acquired absence of both cervix and uterus: Secondary | ICD-10-CM | POA: Insufficient documentation

## 2022-07-09 DIAGNOSIS — Z800 Family history of malignant neoplasm of digestive organs: Secondary | ICD-10-CM | POA: Insufficient documentation

## 2022-07-09 DIAGNOSIS — Z85828 Personal history of other malignant neoplasm of skin: Secondary | ICD-10-CM | POA: Diagnosis not present

## 2022-07-09 DIAGNOSIS — E1165 Type 2 diabetes mellitus with hyperglycemia: Secondary | ICD-10-CM | POA: Diagnosis not present

## 2022-07-09 LAB — CBC WITH DIFFERENTIAL (CANCER CENTER ONLY)
Abs Immature Granulocytes: 0.07 10*3/uL (ref 0.00–0.07)
Basophils Absolute: 0.1 10*3/uL (ref 0.0–0.1)
Basophils Relative: 1 %
Eosinophils Absolute: 0.3 10*3/uL (ref 0.0–0.5)
Eosinophils Relative: 2 %
HCT: 39.9 % (ref 36.0–46.0)
Hemoglobin: 13.8 g/dL (ref 12.0–15.0)
Immature Granulocytes: 1 %
Lymphocytes Relative: 13 %
Lymphs Abs: 1.8 10*3/uL (ref 0.7–4.0)
MCH: 31.8 pg (ref 26.0–34.0)
MCHC: 34.6 g/dL (ref 30.0–36.0)
MCV: 91.9 fL (ref 80.0–100.0)
Monocytes Absolute: 0.6 10*3/uL (ref 0.1–1.0)
Monocytes Relative: 5 %
Neutro Abs: 10.9 10*3/uL — ABNORMAL HIGH (ref 1.7–7.7)
Neutrophils Relative %: 78 %
Platelet Count: 143 10*3/uL — ABNORMAL LOW (ref 150–400)
RBC: 4.34 MIL/uL (ref 3.87–5.11)
RDW: 12.8 % (ref 11.5–15.5)
WBC Count: 13.8 10*3/uL — ABNORMAL HIGH (ref 4.0–10.5)
nRBC: 0 % (ref 0.0–0.2)

## 2022-07-09 LAB — CMP (CANCER CENTER ONLY)
ALT: 10 U/L (ref 0–44)
AST: 8 U/L — ABNORMAL LOW (ref 15–41)
Albumin: 4.1 g/dL (ref 3.5–5.0)
Alkaline Phosphatase: 72 U/L (ref 38–126)
Anion gap: 6 (ref 5–15)
BUN: 15 mg/dL (ref 8–23)
CO2: 30 mmol/L (ref 22–32)
Calcium: 9.4 mg/dL (ref 8.9–10.3)
Chloride: 102 mmol/L (ref 98–111)
Creatinine: 0.56 mg/dL (ref 0.44–1.00)
GFR, Estimated: >60 mL/min (ref >60)
Glucose, Bld: 269 mg/dL — ABNORMAL HIGH (ref 70–99)
Potassium: 4.2 mmol/L (ref 3.5–5.1)
Sodium: 138 mmol/L (ref 135–145)
Total Bilirubin: 0.5 mg/dL (ref 0.3–1.2)
Total Protein: 6.9 g/dL (ref 6.5–8.1)

## 2022-07-10 LAB — SYPHILIS: RPR W/REFLEX TO RPR TITER AND TREPONEMAL ANTIBODIES, TRADITIONAL SCREENING AND DIAGNOSIS ALGORITHM: RPR Ser Ql: NONREACTIVE

## 2022-07-12 ENCOUNTER — Telehealth: Payer: Self-pay | Admitting: Oncology

## 2022-07-12 NOTE — Telephone Encounter (Signed)
Left patient a vm regarding upcoming appointment  

## 2022-08-10 ENCOUNTER — Other Ambulatory Visit (HOSPITAL_COMMUNITY): Payer: Self-pay

## 2022-10-07 ENCOUNTER — Other Ambulatory Visit: Payer: Self-pay | Admitting: Oncology

## 2022-10-07 DIAGNOSIS — D72828 Other elevated white blood cell count: Secondary | ICD-10-CM

## 2022-10-07 NOTE — Progress Notes (Signed)
[facility] Cancer Follow up Visit:  Patient Care Team: Collene Mares, [place] as PCP - General (Internal Medicine) Rennis Golden Lisette Abu, MD as PCP - Cardiology (Cardiology)  CHIEF COMPLAINTS/PURPOSE OF CONSULTATION:  HISTORY OF PRESENTING ILLNESS: Erika [name] 67 y.o. female is here because of leukocytosis Medical history notable for anemia, bilateral cataracts, basal cell carcinoma nasal tip, dysphagia, colon polyps, hyperlipidemia, hypertension, neuropathy repair of rectocele unknown excision from tip of nose, GERD, sleep apnea CPAP March 24, 2021: WBC 15.0 hemoglobin 13.8 ANC 10.6 September 18, 2021: WBC 15.8 hemoglobin 14.1 MCV 95 platelet count 183; 81 segs 11 lymphs 6 monos 2 eos ANC 12.2 September 25, 2021 WBC 14.3 hemoglobin 13.4 MCV 95 platelet count 139; 84 segs  November 13 2021:  [facility] Has seen medical notes referring to elevated WBC but not sure how long that has been the case She doe not recall being on steroids.  Currently on insulin for management of hyperglycemia.  Morning FSBG's currently have run between 77 to 130.  A previous Hgb A1c was 9.1 Patient is to undergo rotator cuff repair in the near future  Social:  Retired.  Formerly Therapist, occupational.  Married.  No tobacco.  EtOH 2 glasses of wine on the weekend.    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  ANC 10.9  October 08, 2022:  Scheduled follow up for granulocytosis.    Reviewed results of labs with patient.  Given continued increase in granulocyte count will arrange for bone marrow bx and aspirate  Review of Systems  Constitutional:  Negative for appetite change, chills, fatigue and fever.       Has lost weight through dieting over the last 4 yrs  HENT:   Positive for hearing loss. Negative for lump/mass, mouth sores, nosebleeds, sore throat, trouble swallowing and voice change.        Chronic post nasal gtt from sinus problems  Eyes:  Negative for eye problems and icterus.       Vision changes:  None  Respiratory:  Negative for chest tightness, cough, hemoptysis, shortness of breath and wheezing.        PND:  none Orthopnea:  none DOE:    Cardiovascular:  Positive for palpitations. Negative for chest pain and leg swelling.       PND:  none Orthopnea:  none  Gastrointestinal:  Negative for abdominal distention, abdominal pain, blood in stool, constipation, diarrhea, nausea and vomiting.  Endocrine: Negative for hot flashes.       Cold intolerance:  none Heat intolerance:  none  Genitourinary:  Negative for difficulty urinating, dysuria, frequency, hematuria and nocturia.   Musculoskeletal:  Positive for arthralgias. Negative for back pain, gait problem, myalgias, neck pain and neck stiffness.  Arthralgis of shoulder  Skin:  Positive for rash. Negative for itching and wound.  Neurological:  Negative for dizziness, extremity weakness, gait problem, headaches, light-headedness, numbness, seizures and speech difficulty.  Hematological:  Negative for adenopathy. Bruises/bleeds easily.  Psychiatric/Behavioral:  Negative for sleep disturbance and suicidal ideas. The patient is not nervous/anxious.     MEDICAL HISTORY: Past Medical History:  Diagnosis Date   Anemia    "just related to fibroids"    Anxiety    takes Clonazepam daily as needed   Arthritis    "wrists; neck" (04/10/2013)   Basal cell carcinoma of nasal tip    Cataract    bilateral   Cramping of feet    takes Potassium daily   Dysphagia    History of colon polyps    Hyperlipidemia    takes Crestor nightly "because I'm a diabetic"   Hypertension    Insomnia    takes Benadryl nightly   Joint pain    Joint swelling    Neuromuscular disorder (HCC)    feet   Neuropathy    both feet   Osteoarthritis of right knee 04/10/2013   Seasonal allergies    takes Omnaris and Claritin daily as needed   SVD (spontaneous vaginal delivery)    x 1   Type II diabetes mellitus (HCC)    takes Janumet daily   Urinary frequency     SURGICAL HISTORY: Past Surgical History:  Procedure Laterality Date   ANTERIOR AND POSTERIOR REPAIR N/A 02/06/2014   Procedure: ANTERIOR (CYSTOCELE) AND POSTERIOR REPAIR (RECTOCELE);  Surgeon: Lavina Hamman, MD;  Location: [facility];  Service: Gynecology;  Laterality: N/A;   BONE SPURS Left 1970's   "ankle"   CLOSED REDUCTION NASAL FRACTURE  ~ 2012   COLONOSCOPY     HYSTEROSCOPY WITH RESECTOSCOPE  2004   "w/fibroids removed"   JOINT REPLACEMENT     right knee   MELANOMA EXCISION  2014   "tip of nose"   TONSILLECTOMY  ~ 1959   TOTAL KNEE ARTHROPLASTY Right 04/10/2013   TOTAL KNEE ARTHROPLASTY Right 04/10/2013   Procedure: TOTAL KNEE ARTHROPLASTY;  Surgeon: Eulas Post, MD;  Location: [facility];  Service: Orthopedics;  Laterality: Right;   VAGINAL HYSTERECTOMY N/A 02/06/2014   Procedure: HYSTERECTOMY VAGINAL;  Surgeon: Lavina Hamman, MD;  Location: [facility];  Service: Gynecology;  Laterality: N/A;    SOCIAL HISTORY: Social History   Socioeconomic History   Marital status: Married    Spouse name: Not on file   Number of children: Not on file   Years of education: Not on file   Highest education level: Not on file  Occupational History   Not on file  Tobacco Use   Smoking status: Never    Smokeless tobacco: Never  Substance and Sexual Activity   Alcohol use: Yes    Alcohol/week: 3.0 standard drinks of alcohol    Types: 3 Glasses of wine per week    Comment: socially   Drug use: No   Sexual activity: Yes    Birth control/protection: Post-menopausal  Other Topics Concern   Not on file  Social History Narrative   Not on file   Social Determinants of Health   Financial Resource Strain: Not on file  Food Insecurity: Not on file  Transportation Needs: Not on file  Physical Activity: Not on file  Stress: Not on file  Social Connections: Not on file  Intimate Partner Violence: Not on file    FAMILY HISTORY Family History  Problem Relation Age of Onset   Bronchitis Mother    Liver cancer Father    Diabetes Sister     ALLERGIES:  is allergic to amoxicillin, fluticasone propionate, nsaids, tetanus toxoids, and tramadol hcl.  MEDICATIONS:  Current Outpatient Medications  Medication Sig Dispense Refill   ALPRAZolam (XANAX) 0.25 MG tablet Take 0.25 mg by mouth at bedtime as needed.     ARTIFICIAL TEARS 0.1-0.3 % SOLN Place 2 drops into both eyes 3 (three) times daily.      Calcium Carb-Cholecalciferol 600-800 MG-UNIT TABS Take 1 tablet by mouth 2 (two) times daily.     cetirizine (ZYRTEC) 10 MG tablet Take 10 mg by mouth daily.     DILT-XR 120 MG 24 hr capsule Take 120 mg by mouth daily.     diphenhydrAMINE (BENADRYL) 25 mg capsule Take 25-50 mg by mouth at bedtime.     Dulaglutide (TRULICITY) 3 MG/0.5ML SOPN Inject 3 mg into the skin once a week. 2 mL 2   ELIQUIS 5 MG TABS tablet Take 5 mg by mouth 2 (two) times daily.     escitalopram (LEXAPRO) 10 MG tablet Take 1 tablet by mouth daily.     fluticasone (FLONASE) 50 MCG/ACT nasal spray Place 1 spray into both nostrils daily.     FREESTYLE LITE test strip 1 each by Other route as needed.     insulin glargine, 1 Unit Dial, (TOUJEO SOLOSTAR) 300 UNIT/ML Solostar Pen Inject into the skin.     lisinopril  (PRINIVIL,ZESTRIL) 2.5 MG tablet Take 2.5 mg by mouth every morning.      metFORMIN (GLUCOPHAGE-XR) 500 MG 24 hr tablet Take 1,000 mg by mouth 2 (two) times daily.     montelukast (SINGULAIR) 10 MG tablet Take 10 mg by mouth daily.     Omega-3 Fatty Acids (FISH OIL PO) Take 1 tablet by mouth daily.     Potassium Gluconate 550 MG TABS Take 2 tablets by mouth every evening.      rosuvastatin (CRESTOR) 10 MG tablet Take 5 mg by mouth every evening.      TRULICITY 3 MG/0.5ML SOPN SMARTSIG:3 Milligram(s) SUB-Q Once a Week     No current facility-administered medications for this visit.    PHYSICAL EXAMINATION:  ECOG PERFORMANCE STATUS: 0 - Asymptomatic   There were no vitals filed for this visit.  There were no vitals filed for this visit.   Physical Exam Vitals and nursing note reviewed.  Constitutional:      Appearance: Normal appearance. She is not toxic-appearing or diaphoretic.     Comments: Here alone.    HENT:     Head: Normocephalic and atraumatic.     Right Ear: External ear normal.     Left Ear: External ear normal.     Nose: Nose normal. No congestion or rhinorrhea.  Eyes:     General: No scleral icterus.    Extraocular Movements: Extraocular movements intact.     Conjunctiva/sclera: Conjunctivae normal.     Pupils: Pupils are equal, round, and reactive to light.  Cardiovascular:     Rate and Rhythm: Normal rate and regular rhythm.     Heart sounds: Normal heart sounds. No murmur heard.    No friction rub. No gallop.  Pulmonary:     Effort: No respiratory distress.     Breath sounds: No stridor. No wheezing, rhonchi or rales.  Chest:     Chest wall: No tenderness.  Abdominal:     General: Bowel sounds are  normal. There is no distension.     Palpations: Abdomen is soft. There is no mass.     Tenderness: There is no abdominal tenderness.  Musculoskeletal:        General: No swelling, tenderness or deformity.     Cervical back: Normal range of motion and neck  supple. No rigidity or tenderness.  Lymphadenopathy:     Head:     Right side of head: No submental, submandibular, tonsillar, preauricular, posterior auricular or occipital adenopathy.     Left side of head: No submental, submandibular, tonsillar, preauricular, posterior auricular or occipital adenopathy.     Cervical: No cervical adenopathy.     Right cervical: No superficial, deep or posterior cervical adenopathy.    Left cervical: No superficial, deep or posterior cervical adenopathy.     Upper Body:     Right upper body: No supraclavicular, axillary, pectoral or epitrochlear adenopathy.     Left upper body: No supraclavicular, axillary, pectoral or epitrochlear adenopathy.  Skin:    General: Skin is warm.     Coloration: Skin is not jaundiced or pale.     Findings: No bruising, erythema or rash.  Neurological:     General: No focal deficit present.     Mental Status: She is alert and oriented to person, place, and time. Mental status is at baseline.     Cranial Nerves: No cranial nerve deficit.     Motor: No weakness.     Gait: Gait normal.  Psychiatric:        Mood and Affect: Mood normal.        Behavior: Behavior normal.        Thought Content: Thought content normal.        Judgment: Judgment normal.     LABORATORY DATA: I have personally reviewed the data as listed:  No visits with results within 1 Month(s) from this visit.  Latest known visit with results is:  Appointment on 07/09/2022  Component Date Value Ref Range Status   Sodium 07/09/2022 138  135 - 145 mmol/L Final   Potassium 07/09/2022 4.2  3.5 - 5.1 mmol/L Final   Chloride 07/09/2022 102  98 - 111 mmol/L Final   CO2 07/09/2022 30  22 - 32 mmol/L Final   Glucose, Bld 07/09/2022 269 (H)  70 - 99 mg/dL Final   Glucose reference range applies only to samples taken after fasting for at least 8 hours.   BUN 07/09/2022 15  8 - 23 mg/dL Final   Creatinine 16/11/9602 0.56  0.44 - 1.00 mg/dL Final   Calcium  54/10/8117 9.4  8.9 - 10.3 mg/dL Final   Total Protein 14/78/2956 6.9  6.5 - 8.1 g/dL Final   Albumin 21/30/8657 4.1  3.5 - 5.0 g/dL Final   AST 84/69/6295 8 (L)  15 - 41 U/L Final   ALT 07/09/2022 10  0 - 44 U/L Final   Alkaline Phosphatase 07/09/2022 72  38 - 126 U/L Final   Total Bilirubin 07/09/2022 0.5  0.3 - 1.2 mg/dL Final   GFR, Estimated 07/09/2022 >60  >60 mL/min Final   Comment: (NOTE) Calculated using the CKD-EPI Creatinine Equation (2021)    Anion gap 07/09/2022 6  5 - 15 Final   Performed at [facility], [address], [place], [place] [address]   RPR Ser Ql 07/09/2022 NON REACTIVE  NON REACTIVE Final   Performed at [facility], [address] [place], [place] [address]  WBC Count 07/09/2022 13.8 (H)  4.0 - 10.5 K/uL Final   RBC 07/09/2022 4.34  3.87 - 5.11 MIL/uL Final   Hemoglobin 07/09/2022 13.8  12.0 - 15.0 g/dL Final   HCT 13/24/4010 39.9  36.0 - 46.0 % Final   MCV 07/09/2022 91.9  80.0 - 100.0 fL Final   MCH 07/09/2022 31.8  26.0 - 34.0 pg Final   MCHC 07/09/2022 34.6  30.0 - 36.0 g/dL Final   RDW 27/25/3664 12.8  11.5 - 15.5 % Final   Platelet Count 07/09/2022 143 (L)  150 - 400 K/uL Final   nRBC 07/09/2022 0.0  0.0 - 0.2 % Final   Neutrophils Relative % 07/09/2022 78  % Final   Neutro Abs 07/09/2022 10.9 (H)  1.7 - 7.7 K/uL Final   Lymphocytes Relative 07/09/2022 13  % Final   Lymphs Abs 07/09/2022 1.8  0.7 - 4.0 K/uL Final   Monocytes Relative 07/09/2022 5  % Final   Monocytes Absolute 07/09/2022 0.6  0.1 - 1.0 K/uL Final   Eosinophils Relative 07/09/2022 2  % Final   Eosinophils Absolute 07/09/2022 0.3  0.0 - 0.5 K/uL Final   Basophils Relative 07/09/2022 1  % Final   Basophils Absolute 07/09/2022 0.1  0.0 - 0.1 K/uL Final   Immature Granulocytes 07/09/2022 1  % Final   Abs Immature Granulocytes 07/09/2022 0.07  0.00 - 0.07 K/uL Final   Performed at [facility], [address] [place],  [place] [address]    RADIOGRAPHIC STUDIES: I have personally reviewed the radiological images as listed and agree with the findings in the report  No results found.  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    Possible causes of granulocytosis in this patient.   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  This variant has not reported in databases (COSMIC; gnomAD) or in the literature as a somatic variant in tumors or as a population variant. Clinical significance is currently unclear.  The colony stimulating factor 3 receptor (granulocyte) gene, or CSF3R, is located on chromosome 1p34.3 and encodes a cytokine required for granulocyte maturation and function.  Acquired mutations in CSF3R have been reported in myeloid malignancies, including chronic neutrophilic leukemia (CNL), atypical chronic myeloid leukemia (aCML), and, rarely, acute myeloid leukemia (AML). In  CNL, oncogenic CSF3R mutations have been reported in over 80% of patients (These occur in Exon 14 generally)  The VAF of 49% raises the question of this being an inherited gene anomaly.    Will arrange fo bone marrow bx and aspirate to evaluate, particularly given the increase in granulocyte count.     Cancer Staging  No matching staging information was found for the patient.    No problem-specific Assessment & Plan notes found for this encounter.   No orders of the  defined types were placed in this encounter.  30 minutes was spent in patient care.  This included time spent preparing to  see the patient (e.g., review of tests), obtaining and/or reviewing separately obtained history, counseling and educating the patient/family/caregiver, ordering medications, tests; documenting clinical information in the electronic or other health record, independently interpreting results and communicating results to the patient as well as coordination of care.        All questions were answered. The patient knows to call the clinic with any problems, questions or concerns.  This note was electronically signed.    Loni Muse, MD  10/07/2022 1:54 PM

## 2022-10-08 ENCOUNTER — Other Ambulatory Visit: Payer: Self-pay

## 2022-10-08 ENCOUNTER — Inpatient Hospital Stay: Payer: Medicare Other

## 2022-10-08 ENCOUNTER — Inpatient Hospital Stay: Payer: Medicare Other | Attending: Oncology | Admitting: Oncology

## 2022-10-08 VITALS — BP 114/54 | HR 103 | Temp 98.2°F | Resp 15 | Ht 64.0 in | Wt 157.2 lb

## 2022-10-08 DIAGNOSIS — D72828 Other elevated white blood cell count: Secondary | ICD-10-CM

## 2022-10-08 DIAGNOSIS — Z1589 Genetic susceptibility to other disease: Secondary | ICD-10-CM

## 2022-10-08 DIAGNOSIS — Z800 Family history of malignant neoplasm of digestive organs: Secondary | ICD-10-CM | POA: Insufficient documentation

## 2022-10-08 LAB — CBC WITH DIFFERENTIAL (CANCER CENTER ONLY)
Abs Immature Granulocytes: 0.05 10*3/uL (ref 0.00–0.07)
Basophils Absolute: 0.1 10*3/uL (ref 0.0–0.1)
Basophils Relative: 1 %
Eosinophils Absolute: 0.1 10*3/uL (ref 0.0–0.5)
Eosinophils Relative: 1 %
HCT: 42 % (ref 36.0–46.0)
Hemoglobin: 14.7 g/dL (ref 12.0–15.0)
Immature Granulocytes: 0 %
Lymphocytes Relative: 15 %
Lymphs Abs: 2.3 10*3/uL (ref 0.7–4.0)
MCH: 32.4 pg (ref 26.0–34.0)
MCHC: 35 g/dL (ref 30.0–36.0)
MCV: 92.5 fL (ref 80.0–100.0)
Monocytes Absolute: 0.8 10*3/uL (ref 0.1–1.0)
Monocytes Relative: 5 %
Neutro Abs: 11.7 10*3/uL — ABNORMAL HIGH (ref 1.7–7.7)
Neutrophils Relative %: 78 %
Platelet Count: 136 10*3/uL — ABNORMAL LOW (ref 150–400)
RBC: 4.54 MIL/uL (ref 3.87–5.11)
RDW: 12.9 % (ref 11.5–15.5)
WBC Count: 15 10*3/uL — ABNORMAL HIGH (ref 4.0–10.5)
nRBC: 0 % (ref 0.0–0.2)

## 2022-10-08 NOTE — Patient Instructions (Signed)
Bone marrow aspiration and bone marrow biopsy are procedures to collect and examine bone marrow -- the spongy tissue inside some of your larger bones.  Bone marrow aspiration and bone marrow biopsy can show whether your bone marrow is healthy and making normal amounts of blood cells. Doctors use these procedures to diagnose and monitor blood and marrow diseases, including some cancers, as well as fevers of unknown origin.  Bone marrow has a fluid portion and a more solid portion. In bone marrow aspiration, a needle is used to withdraw a sample of the fluid portion. In bone marrow biopsy, a needle is used to withdraw a sample of the solid portion.  Bone marrow aspiration can be performed alone, but it's usually combined with bone marrow biopsy. Together, these procedures may be called a bone marrow exam.  Why it's done A bone marrow exam offers detailed information about the condition of your bone marrow and blood cells.  Your doctor may order a bone marrow exam if blood tests are abnormal or don't provide enough information about a suspected problem.  Your doctor may perform a bone marrow exam to:  Diagnose a disease or condition involving the bone marrow or blood cells Determine the stage or progression of a disease Determine whether iron levels are adequate Monitor treatment of a disease Investigate a fever of unknown origin A bone marrow exam may be used for many conditions. These include:  Anemia Blood cell conditions in which too few or too many of certain types of blood cells are produced, such as leukopenia, leukocytosis, thrombocytopenia, thrombocytosis, pancytopenia and polycythemia Cancers of the blood or bone marrow, including leukemias, lymphomas and multiple myeloma Cancers that have spread from another area, such as the breast, into the bone marrow Hemochromatosis Fevers of unknown origin

## 2022-10-09 LAB — RPR: RPR Ser Ql: NONREACTIVE

## 2022-10-14 LAB — MISC LABCORP TEST (SEND OUT): Labcorp test code: 164798

## 2022-10-15 ENCOUNTER — Telehealth: Payer: Self-pay | Admitting: Hematology and Oncology

## 2022-10-18 ENCOUNTER — Telehealth: Payer: Self-pay | Admitting: Hematology and Oncology

## 2022-11-03 ENCOUNTER — Other Ambulatory Visit: Payer: Self-pay | Admitting: Radiology

## 2022-11-03 DIAGNOSIS — D72828 Other elevated white blood cell count: Secondary | ICD-10-CM

## 2022-11-03 NOTE — Consult Note (Signed)
Chief Complaint: Patient was seen in consultation today for CT-guided bone marrow biopsy  Referring Physician(s): Ribakove,Everett C  Supervising Physician: Marliss Coots  Patient Status: [facility] - Out-pt  History of Present Illness: Erika [name] is a 67 y.o. female with past medical history significant for anemia, anxiety, arthritis, skin cancer, colon polyps, hyperlipidemia, hypertension, neuropathy, diabetes, GERD, sleep apnea presenting now with granulocytosis of uncertain etiology.  She is scheduled today for CT-guided bone marrow biopsy for further evaluation.  Past Medical History:  Diagnosis Date   Anemia    "just related to fibroids"   Anxiety    takes Clonazepam daily as needed   Arthritis    "wrists; neck" (04/10/2013)   Basal cell carcinoma of nasal tip    Cataract    bilateral   Cramping of feet    takes Potassium daily   Dysphagia    History of colon polyps    Hyperlipidemia    takes Crestor nightly "because I'm a diabetic"   Hypertension    Insomnia    takes Benadryl nightly   Joint pain    Joint swelling    Neuromuscular disorder (HCC)    feet   Neuropathy    both feet   Osteoarthritis of right knee 04/10/2013   Seasonal allergies    takes Omnaris and Claritin daily as needed   SVD (spontaneous vaginal delivery)    x 1   Type II diabetes mellitus (HCC)    takes Janumet daily   Urinary frequency     Past Surgical History:  Procedure Laterality Date   ANTERIOR AND POSTERIOR REPAIR N/A 02/06/2014   Procedure: ANTERIOR (CYSTOCELE) AND POSTERIOR REPAIR (RECTOCELE);  Surgeon: Lavina Hamman, MD;  Location: [facility];  Service: Gynecology;  Laterality: N/A;   BONE SPURS Left 1970's   "ankle"   CLOSED REDUCTION NASAL FRACTURE  ~ 2012   COLONOSCOPY     HYSTEROSCOPY WITH RESECTOSCOPE  2004   "w/fibroids removed"   JOINT REPLACEMENT     right knee   MELANOMA EXCISION  2014   "tip of nose"   TONSILLECTOMY  ~ 1959   TOTAL KNEE ARTHROPLASTY Right  04/10/2013   TOTAL KNEE ARTHROPLASTY Right 04/10/2013   Procedure: TOTAL KNEE ARTHROPLASTY;  Surgeon: Eulas Post, MD;  Location: [facility];  Service: Orthopedics;  Laterality: Right;   VAGINAL HYSTERECTOMY N/A 02/06/2014   Procedure: HYSTERECTOMY VAGINAL;  Surgeon: Lavina Hamman, MD;  Location: [facility];  Service: Gynecology;  Laterality: N/A;    Allergies: Amoxicillin, Fluticasone propionate, Nsaids, Tetanus toxoids, and Tramadol hcl  Medications: Prior to Admission medications   Medication Sig Start Date End Date Taking? Authorizing Provider  ALPRAZolam (XANAX) 0.25 MG tablet Take 0.25 mg by mouth at bedtime as needed. 09/10/19   [provider]  ARTIFICIAL TEARS 0.1-0.3 % SOLN Place 2 drops into both eyes 3 (three) times daily.     [provider]  Calcium Carb-Cholecalciferol 600-800 MG-UNIT TABS Take 1 tablet by mouth 2 (two) times daily.    [provider]  cetirizine (ZYRTEC) 10 MG tablet Take 10 mg by mouth daily.    [provider]  DILT-XR 120 MG 24 hr capsule Take 120 mg by mouth daily. 10/10/19   [provider]  diphenhydrAMINE (BENADRYL) 25 mg capsule Take 25-50 mg by mouth at bedtime.    [provider]  Dulaglutide (TRULICITY) 3 MG/0.5ML SOPN Inject 3 mg into the skin once a week. 06/07/22     ELIQUIS 5  MG TABS tablet Take 5 mg by mouth 2 (two) times daily. 10/10/19   [provider]  escitalopram (LEXAPRO) 10 MG tablet Take 1 tablet by mouth daily. 10/21/19   [provider]  fluticasone (FLONASE) 50 MCG/ACT nasal spray Place 1 spray into both nostrils daily. 08/03/19   [provider]  FREESTYLE LITE test strip 1 each by Other route as needed. 08/21/19   [provider]  insulin glargine, 1 Unit Dial, (TOUJEO SOLOSTAR) 300 UNIT/ML Solostar Pen Inject into the skin.    [provider]  lisinopril (PRINIVIL,ZESTRIL) 2.5 MG tablet Take 2.5 mg by mouth every morning.     [provider]  metFORMIN (GLUCOPHAGE-XR) 500 MG 24 hr tablet Take 1,000 mg by mouth 2 (two) times daily. 10/07/19   [provider]  montelukast (SINGULAIR) 10 MG tablet Take 10 mg by mouth daily. 08/20/19   [provider]  Omega-3 Fatty Acids (FISH OIL PO) Take 1 tablet by mouth daily.    [provider]  Potassium Gluconate 550 MG TABS Take 2 tablets by mouth every evening.     [provider]  rosuvastatin (CRESTOR) 10 MG tablet Take 5 mg by mouth every evening.     [provider]  TRULICITY 3 MG/0.5ML SOPN SMARTSIG:3 Milligram(s) SUB-Q Once a Week    [provider]     Family History  Problem Relation Age of Onset   Bronchitis Mother    Liver cancer Father    Diabetes Sister     Social History   Socioeconomic History   Marital status: Married    Spouse name: Not on file   Number of children: Not on file   Years of education: Not on file   Highest education level: Not on file  Occupational History   Not on file  Tobacco Use   Smoking status: Never   Smokeless tobacco: Never  Substance and Sexual Activity   Alcohol use: Yes    Alcohol/week: 3.0 standard drinks of alcohol    Types: 3 Glasses of wine per week    Comment: socially   Drug use: No   Sexual activity: Yes    Birth control/protection: Post-menopausal  Other Topics Concern   Not on file  Social History Narrative   Not on file   Social Determinants of Health   Financial Resource Strain: Not on file  Food Insecurity: Not on file  Transportation Needs: Not on file  Physical Activity: Not on file  Stress: Not on file  Social Connections: Not on file      Review of Systems  Vital Signs:   Code Status:   Advance Care Plan: No documents on file   Physical Exam  Imaging: No results found.  Labs:  CBC: Recent Labs    11/13/21 1156 07/09/22 1211 10/08/22 1245  WBC 11.3* 13.8* 15.0*  HGB 14.2 13.8 14.7  HCT 41.6 39.9 42.0  PLT 177 143* 136*     COAGS: No results for input(s): "INR", "APTT" in the last 8760 hours.  BMP: Recent Labs    11/13/21 1156 07/09/22 1211  NA 139 138  K 3.9 4.2  CL 103 102  CO2 31 30  GLUCOSE 132* 269*  BUN 16 15  CALCIUM 9.6 9.4  CREATININE 0.52 0.56  GFRNONAA >60 >60    LIVER FUNCTION TESTS: Recent Labs    11/13/21 1156 07/09/22 1211  BILITOT 0.6 0.5  AST 11* 8*  ALT 12 10  ALKPHOS  74 72  PROT 7.6 6.9  ALBUMIN 4.3 4.1    TUMOR MARKERS: No results for input(s): "AFPTM", "CEA", "CA199", "CHROMGRNA" in the last 8760 hours.  Assessment and Plan: 67 y.o. female with past medical history significant for anemia, anxiety, arthritis, skin cancer, colon polyps, hyperlipidemia, hypertension, neuropathy, diabetes, GERD, sleep apnea presenting now with granulocytosis of uncertain etiology.  She is scheduled today for CT-guided bone marrow biopsy for further evaluation.Risks and benefits of procedure was discussed with the patient  including, but not limited to bleeding, infection, damage to adjacent structures or low yield requiring additional tests.  All of the questions were answered and there is agreement to proceed.  Consent signed and in chart.    Thank you for this interesting consult.  I greatly enjoyed meeting Erika [name] and look forward to participating in their care.  A copy of this report was sent to the requesting provider on this date.  Electronically Signed: D. Jeananne Rama, PA-C 11/03/2022, 2:31 PM   I spent a total of  20 minutes   in face to face in clinical consultation, greater than 50% of which was counseling/coordinating care for CT-guided bone marrow biopsy

## 2022-11-04 ENCOUNTER — Ambulatory Visit (HOSPITAL_COMMUNITY)
Admission: RE | Admit: 2022-11-04 | Discharge: 2022-11-04 | Disposition: A | Discharge status: Home / Self Care | Payer: Medicare Other | Source: Ambulatory Visit | Attending: Oncology | Admitting: Oncology

## 2022-11-04 ENCOUNTER — Other Ambulatory Visit: Payer: Self-pay

## 2022-11-04 ENCOUNTER — Encounter (HOSPITAL_COMMUNITY): Payer: Self-pay

## 2022-11-04 DIAGNOSIS — Z7984 Long term (current) use of oral hypoglycemic drugs: Secondary | ICD-10-CM | POA: Diagnosis not present

## 2022-11-04 DIAGNOSIS — Z794 Long term (current) use of insulin: Secondary | ICD-10-CM | POA: Diagnosis not present

## 2022-11-04 DIAGNOSIS — D72828 Other elevated white blood cell count: Secondary | ICD-10-CM | POA: Insufficient documentation

## 2022-11-04 DIAGNOSIS — I4891 Unspecified atrial fibrillation: Secondary | ICD-10-CM | POA: Insufficient documentation

## 2022-11-04 DIAGNOSIS — Z7985 Long-term (current) use of injectable non-insulin antidiabetic drugs: Secondary | ICD-10-CM | POA: Insufficient documentation

## 2022-11-04 DIAGNOSIS — F419 Anxiety disorder, unspecified: Secondary | ICD-10-CM | POA: Insufficient documentation

## 2022-11-04 DIAGNOSIS — E785 Hyperlipidemia, unspecified: Secondary | ICD-10-CM | POA: Diagnosis not present

## 2022-11-04 DIAGNOSIS — I10 Essential (primary) hypertension: Secondary | ICD-10-CM | POA: Diagnosis not present

## 2022-11-04 DIAGNOSIS — E1140 Type 2 diabetes mellitus with diabetic neuropathy, unspecified: Secondary | ICD-10-CM | POA: Insufficient documentation

## 2022-11-04 DIAGNOSIS — E1136 Type 2 diabetes mellitus with diabetic cataract: Secondary | ICD-10-CM | POA: Diagnosis not present

## 2022-11-04 LAB — CBC WITH DIFFERENTIAL/PLATELET
Abs Immature Granulocytes: 0.08 10*3/uL — ABNORMAL HIGH (ref 0.00–0.07)
Basophils Absolute: 0.1 10*3/uL (ref 0.0–0.1)
Basophils Relative: 0 %
Eosinophils Absolute: 0.1 10*3/uL (ref 0.0–0.5)
Eosinophils Relative: 1 %
HCT: 41.6 % (ref 36.0–46.0)
Hemoglobin: 13.6 g/dL (ref 12.0–15.0)
Immature Granulocytes: 1 %
Lymphocytes Relative: 15 %
Lymphs Abs: 2.5 10*3/uL (ref 0.7–4.0)
MCH: 30.6 pg (ref 26.0–34.0)
MCHC: 32.7 g/dL (ref 30.0–36.0)
MCV: 93.5 fL (ref 80.0–100.0)
Monocytes Absolute: 0.8 10*3/uL (ref 0.1–1.0)
Monocytes Relative: 5 %
Neutro Abs: 13.1 10*3/uL — ABNORMAL HIGH (ref 1.7–7.7)
Neutrophils Relative %: 78 %
Platelets: 139 10*3/uL — ABNORMAL LOW (ref 150–400)
RBC: 4.45 MIL/uL (ref 3.87–5.11)
RDW: 13.1 % (ref 11.5–15.5)
WBC: 16.7 10*3/uL — ABNORMAL HIGH (ref 4.0–10.5)
nRBC: 0 % (ref 0.0–0.2)

## 2022-11-04 LAB — GLUCOSE, CAPILLARY: Glucose-Capillary: 155 mg/dL — ABNORMAL HIGH (ref 70–99)

## 2022-11-04 MED ORDER — SODIUM CHLORIDE 0.9 % IV SOLN: INTRAVENOUS | Status: DC

## 2022-11-04 MED ORDER — FENTANYL CITRATE (PF) 100 MCG/2ML IJ SOLN
INTRAMUSCULAR | Status: AC
  Filled 2022-11-04: qty 2

## 2022-11-04 MED ORDER — MIDAZOLAM HCL 2 MG/2ML IJ SOLN
INTRAMUSCULAR | Status: AC | PRN
Start: 2022-11-04 — End: 2022-11-04
  Administered 2022-11-04 (×2): 1 mg via INTRAVENOUS

## 2022-11-04 MED ORDER — HEPARIN SOD (PORK) LOCK FLUSH 100 UNIT/ML IV SOLN
INTRAVENOUS | Status: AC
  Filled 2022-11-04: qty 5

## 2022-11-04 MED ORDER — MIDAZOLAM HCL 2 MG/2ML IJ SOLN
INTRAMUSCULAR | Status: AC
  Filled 2022-11-04: qty 4

## 2022-11-04 MED ORDER — FENTANYL CITRATE (PF) 100 MCG/2ML IJ SOLN
INTRAMUSCULAR | Status: AC | PRN
Start: 2022-11-04 — End: 2022-11-04
  Administered 2022-11-04 (×2): 50 ug via INTRAVENOUS

## 2022-11-04 MED ORDER — MIDAZOLAM HCL 2 MG/2ML IJ SOLN
INTRAMUSCULAR | Status: AC
  Filled 2022-11-04: qty 2

## 2022-11-04 NOTE — Procedures (Signed)
Interventional Radiology Procedure Note  Procedure: CT guided aspirate and core biopsy of right iliac bone  Complications: None  Recommendations: - Bedrest supine x 1 hrs - Hydrocodone PRN  Pain - Follow biopsy results   Marliss Coots, MD

## 2022-11-04 NOTE — Discharge Instructions (Signed)
Please call Interventional Radiology clinic [phone] with any questions or concerns.  You may remove your dressing and shower tomorrow.  After the procedure, it is common to have: Soreness Bruising Mild pain  Follow these instructions at home:  Medication: Do not use Aspirin or ibuprofen products, such as Advil or Motrin, as it may increase bleeding You may resume your usual medications as ordered by your doctor If your doctor prescribed antibiotics, take them as directed. Do not stop taking them just because you feel better. You need to take the full course of antibiotics  Eating and drinking: Drink plenty of liquids to keep your urine pale yellow You can resume your regular diet as directed by your doctor   Care of the procedure site  Check your biopsy site every day until it heals  Keep the bandage dry. You can take the bandage off and shower tomorrow  If you are bleeding from the biopsy site, apply firm pressure on the area for at least 30 minutes  If directed, apply ice to your biopsy site 2-3 times a day.  o Put ice in a plastic bag  o Place a towel between your skin and the ice bag  o Leave the ice in place for 20 minutes 2-3 times a day  Activity Do not take baths, swim, or use a hot tub until your health care provider approves  Keep all follow-up visits as told by your doctor  Contact your doctor or seek immediate medical care if: You have bright red bleeding from the biopsy site that does not stop after 30 minutes of holding direct pressure on the site. You have signs of infection, such as: Increased pain, swelling, warmth, or redness at the biopsy site Red streaks leading from the biopsy site Yellow or green drainage from the biopsy site A fever (temperature over 100.42F) chills, or both

## 2022-11-05 ENCOUNTER — Ambulatory Visit: Payer: Medicare Other | Admitting: Hematology and Oncology

## 2022-11-09 LAB — SURGICAL PATHOLOGY

## 2022-11-11 ENCOUNTER — Encounter (HOSPITAL_COMMUNITY): Payer: Self-pay | Admitting: Oncology

## 2022-11-12 ENCOUNTER — Ambulatory Visit: Payer: Medicare Other | Admitting: Hematology and Oncology

## 2022-11-12 ENCOUNTER — Encounter (HOSPITAL_COMMUNITY): Payer: Self-pay

## 2022-11-18 ENCOUNTER — Inpatient Hospital Stay: Payer: Medicare Other | Attending: Oncology | Admitting: Hematology and Oncology

## 2022-11-18 VITALS — BP 122/77 | HR 64 | Temp 98.1°F | Resp 13 | Wt 158.9 lb

## 2022-11-18 DIAGNOSIS — Z1589 Genetic susceptibility to other disease: Secondary | ICD-10-CM | POA: Diagnosis not present

## 2022-11-18 DIAGNOSIS — Z800 Family history of malignant neoplasm of digestive organs: Secondary | ICD-10-CM | POA: Diagnosis not present

## 2022-11-18 DIAGNOSIS — G4733 Obstructive sleep apnea (adult) (pediatric): Secondary | ICD-10-CM | POA: Insufficient documentation

## 2022-11-18 DIAGNOSIS — D72829 Elevated white blood cell count, unspecified: Secondary | ICD-10-CM | POA: Diagnosis not present

## 2022-11-18 DIAGNOSIS — Z85820 Personal history of malignant melanoma of skin: Secondary | ICD-10-CM | POA: Insufficient documentation

## 2022-11-18 DIAGNOSIS — D72828 Other elevated white blood cell count: Secondary | ICD-10-CM

## 2022-11-18 NOTE — Progress Notes (Signed)
[facility] [phone]   Fax:([phone]  PROGRESS NOTE  Patient Care Team: Collene Mares, [place] as PCP - General (Internal Medicine) Rennis Golden Lisette Abu, MD as PCP - Cardiology (Cardiology)  Hematological/Oncological History # Leukocytosis, Neutrophilia # OSA not on CPAP #CSF3R mutation 11/13/2021: establish care with Dr. Angelene Giovanni. WBC 11.3, Hgb 14.2, MCV 94.8, Plt 177 10/08/2022: last visit with Dr. Angelene Giovanni  11/04/2022: Bone marrow biopsy showed hypercellular bone marrow with otherwise orderly trilineage hematopoiesis in the background of an absolute neutrophilia  11/18/2022: establish care with Dr. Leonides Schanz   Interval History:  Erika [name] 67 y.o. female with medical history significant for leukocytosis who presents for a follow up visit. The patient's last visit was on 10/08/2022. In the interim since the last visit she underwent a bone marrow biopsy at the request of Dr. Angelene Giovanni which showed orderly trilineage hematopoiesis with no other concerning findings.  On exam today Mrs. Erika [name] is accompanied by her husband.  She reports she has been well overall in the room since her last visit.  She reports that she has not had any major changes in her health.  She reports the bone marrow biopsy procedure went well.  She reports that she does not have any inflammatory conditions but does have osteoarthritis with "wear-and-tear".  She is a never smoker.  She reports that she does have a history of OSA but is not currently using a CPAP machine because she finds it uncomfortable.  Otherwise she denies any fevers, chills, sweats, nausea, Mi or diarrhea.  Full 10 point ROS is otherwise negative.  The bulk of our discussion focused on the diagnosis of leukocytosis and the possible etiologies including the tier 3 mutation as well as the obstructive sleep apnea that is untreated.  The patient voiced understanding of our findings and plan moving forward.  MEDICAL  HISTORY:  Past Medical History:  Diagnosis Date   Anemia    "just related to fibroids"   Anxiety    takes Clonazepam daily as needed   Arthritis    "wrists; neck" (04/10/2013)   Basal cell carcinoma of nasal tip    Cataract    bilateral   Cramping of feet    takes Potassium daily   Dysphagia    History of colon polyps    Hyperlipidemia    takes Crestor nightly "because I'm a diabetic"   Hypertension    Insomnia    takes Benadryl nightly   Joint pain    Joint swelling    Neuromuscular disorder (HCC)    feet   Neuropathy    both feet   Osteoarthritis of right knee 04/10/2013   Seasonal allergies    takes Omnaris and Claritin daily as needed   SVD (spontaneous vaginal delivery)    x 1   Type II diabetes mellitus (HCC)    takes Janumet daily   Urinary frequency     SURGICAL HISTORY: Past Surgical History:  Procedure Laterality Date   ANTERIOR AND POSTERIOR REPAIR N/A 02/06/2014   Procedure: ANTERIOR (CYSTOCELE) AND POSTERIOR REPAIR (RECTOCELE);  Surgeon: Lavina Hamman, MD;  Location: [facility];  Service: Gynecology;  Laterality: N/A;   BONE SPURS Left 1970's   "ankle"   CLOSED REDUCTION NASAL FRACTURE  ~ 2012   COLONOSCOPY     HYSTEROSCOPY WITH RESECTOSCOPE  2004   "w/fibroids removed"   JOINT REPLACEMENT     right knee   MELANOMA EXCISION  2014   "tip of nose"  TONSILLECTOMY  ~ 1959   TOTAL KNEE ARTHROPLASTY Right 04/10/2013   TOTAL KNEE ARTHROPLASTY Right 04/10/2013   Procedure: TOTAL KNEE ARTHROPLASTY;  Surgeon: Eulas Post, MD;  Location: [facility];  Service: Orthopedics;  Laterality: Right;   VAGINAL HYSTERECTOMY N/A 02/06/2014   Procedure: HYSTERECTOMY VAGINAL;  Surgeon: Lavina Hamman, MD;  Location: [facility];  Service: Gynecology;  Laterality: N/A;    SOCIAL HISTORY: Social History   Socioeconomic History   Marital status: Married    Spouse name: Not on file   Number of children: Not on file   Years of education: Not on file   Highest education level: Not  on file  Occupational History   Not on file  Tobacco Use   Smoking status: Never   Smokeless tobacco: Never  Substance and Sexual Activity   Alcohol use: Yes    Alcohol/week: 3.0 standard drinks of alcohol    Types: 3 Glasses of wine per week    Comment: socially   Drug use: No   Sexual activity: Yes    Birth control/protection: Post-menopausal  Other Topics Concern   Not on file  Social History Narrative   Not on file   Social Determinants of Health   Financial Resource Strain: Not on file  Food Insecurity: Not on file  Transportation Needs: Not on file  Physical Activity: Not on file  Stress: Not on file  Social Connections: Not on file  Intimate Partner Violence: Not on file    FAMILY HISTORY: Family History  Problem Relation Age of Onset   Bronchitis Mother    Liver cancer Father    Diabetes Sister     ALLERGIES:  is allergic to amoxicillin, fluticasone propionate, nsaids, tetanus toxoids, and tramadol hcl.  MEDICATIONS:  Current Outpatient Medications  Medication Sig Dispense Refill   ALPRAZolam (XANAX) 0.25 MG tablet Take 0.25 mg by mouth at bedtime as needed.     ARTIFICIAL TEARS 0.1-0.3 % SOLN Place 2 drops into both eyes 3 (three) times daily.      Calcium Carb-Cholecalciferol 600-800 MG-UNIT TABS Take 1 tablet by mouth 2 (two) times daily.     cetirizine (ZYRTEC) 10 MG tablet Take 10 mg by mouth daily.     DILT-XR 120 MG 24 hr capsule Take 120 mg by mouth daily.     diphenhydrAMINE (BENADRYL) 25 mg capsule Take 25-50 mg by mouth at bedtime.     Dulaglutide (TRULICITY) 3 MG/0.5ML SOPN Inject 3 mg into the skin once a week. 2 mL 2   ELIQUIS 5 MG TABS tablet Take 5 mg by mouth 2 (two) times daily.     escitalopram (LEXAPRO) 10 MG tablet Take 1 tablet by mouth daily.     fluticasone (FLONASE) 50 MCG/ACT nasal spray Place 1 spray into both nostrils daily.     FREESTYLE LITE test strip 1 each by Other route as needed.     insulin glargine, 1 Unit Dial,  (TOUJEO SOLOSTAR) 300 UNIT/ML Solostar Pen Inject into the skin.     lisinopril (PRINIVIL,ZESTRIL) 2.5 MG tablet Take 2.5 mg by mouth every morning.      metFORMIN (GLUCOPHAGE-XR) 500 MG 24 hr tablet Take 1,000 mg by mouth 2 (two) times daily.     montelukast (SINGULAIR) 10 MG tablet Take 10 mg by mouth daily.     Omega-3 Fatty Acids (FISH OIL PO) Take 1 tablet by mouth daily.     Potassium Gluconate 550 MG TABS Take 2 tablets by mouth every evening.  rosuvastatin (CRESTOR) 10 MG tablet Take 5 mg by mouth every evening.      TRULICITY 3 MG/0.5ML SOPN SMARTSIG:3 Milligram(s) SUB-Q Once a Week     No current facility-administered medications for this visit.    REVIEW OF SYSTEMS:   Constitutional: ( - ) fevers, ( - )  chills , ( - ) night sweats Eyes: ( - ) blurriness of vision, ( - ) double vision, ( - ) watery eyes Ears, nose, mouth, throat, and face: ( - ) mucositis, ( - ) sore throat Respiratory: ( - ) cough, ( - ) dyspnea, ( - ) wheezes Cardiovascular: ( - ) palpitation, ( - ) chest discomfort, ( - ) lower extremity swelling Gastrointestinal:  ( - ) nausea, ( - ) heartburn, ( - ) change in bowel habits Skin: ( - ) abnormal skin rashes Lymphatics: ( - ) new lymphadenopathy, ( - ) easy bruising Neurological: ( - ) numbness, ( - ) tingling, ( - ) new weaknesses Behavioral/Psych: ( - ) mood change, ( - ) new changes  All other systems were reviewed with the patient and are negative.  PHYSICAL EXAMINATION: Vitals:   11/18/22 1529  BP: 122/77  Pulse: 64  Resp: 13  Temp: 98.1 F (36.7 C)  SpO2: 98%   Filed Weights   11/18/22 1529  Weight: 158 lb 14.4 oz (72.1 kg)    GENERAL: well appearing elderly Caucasian female, alert, no distress and comfortable SKIN: skin color, texture, turgor are normal, no rashes or significant lesions EYES: conjunctiva are pink and non-injected, sclera clear LUNGS: clear to auscultation and percussion with normal breathing effort HEART: regular  rate & rhythm and no murmurs and no lower extremity edema Musculoskeletal: no cyanosis of digits and no clubbing  PSYCH: alert & oriented x 3, fluent speech NEURO: no focal motor/sensory deficits  LABORATORY DATA:  I have reviewed the data as listed    Latest Ref Rng & Units 11/04/2022    7:31 AM 10/08/2022   12:45 PM 07/09/2022   12:11 PM  CBC  WBC 4.0 - 10.5 K/uL 16.7  15.0  13.8   Hemoglobin 12.0 - 15.0 g/dL 40.9  81.1  91.4   Hematocrit 36.0 - 46.0 % 41.6  42.0  39.9   Platelets 150 - 400 K/uL 139  136  143        Latest Ref Rng & Units 07/09/2022   12:11 PM 11/13/2021   11:56 AM 01/29/2014    8:10 AM  CMP  Glucose 70 - 99 mg/dL 782  956  213   BUN 8 - 23 mg/dL 15  16  13    Creatinine 0.44 - 1.00 mg/dL 0.86  5.78  4.69   Sodium 135 - 145 mmol/L 138  139  139   Potassium 3.5 - 5.1 mmol/L 4.2  3.9  4.5   Chloride 98 - 111 mmol/L 102  103  105   CO2 22 - 32 mmol/L 30  31  28    Calcium 8.9 - 10.3 mg/dL 9.4  9.6  9.5   Total Protein 6.5 - 8.1 g/dL 6.9  7.6  6.8   Total Bilirubin 0.3 - 1.2 mg/dL 0.5  0.6  0.7   Alkaline Phos 38 - 126 U/L 72  74  70   AST 15 - 41 U/L 8  11  20    ALT 0 - 44 U/L 10  12  22      RADIOGRAPHIC STUDIES: CT BONE MARROW BIOPSY &  ASPIRATION  Result Date: 11/04/2022 INDICATION: 67 year old female with history of granulocytosis. EXAM: CT-GUIDED BONE MARROW BIOPSY AND ASPIRATION MEDICATIONS: None ANESTHESIA/SEDATION: Fentanyl 100 mcg IV; Versed to mg IV Sedation Time: 10 minutes; The patient was continuously monitored during the procedure by the interventional radiology nurse under my direct supervision. COMPLICATIONS: None immediate. PROCEDURE: Informed consent was obtained from the patient following an explanation of the procedure, risks, benefits and alternatives. The patient understands, agrees and consents for the procedure. All questions were addressed. A time out was performed prior to the initiation of the procedure. The patient was positioned prone and  non-contrast localization CT was performed of the pelvis to demonstrate the iliac marrow spaces. The operative site was prepped and draped in the usual sterile fashion. Under sterile conditions and local anesthesia, a 22 gauge spinal needle was utilized for procedural planning. Next, an 11 gauge coaxial bone biopsy needle was advanced into the right iliac marrow space. Needle position was confirmed with CT imaging. Initially, a bone marrow aspiration was performed. Next, a bone marrow biopsy was obtained with the 11 gauge outer bone marrow device. Samples were prepared with the cytotechnologist and deemed adequate. The needle was removed and superficial hemostasis was obtained with manual compression. A dressing was applied. The patient tolerated the procedure well without immediate post procedural complication. IMPRESSION: Successful CT guided right iliac bone marrow aspiration and core biopsy. Marliss Coots, MD [facility] Electronically Signed   By: Marliss Coots M.D.   On: 11/04/2022 12:26    ASSESSMENT & PLAN CAROLEENA PAOLINI 67 y.o. female with medical history significant for leukocytosis who presents for a follow up visit.  # Leukocytosis, Neutrophilic Predominate  # Tier III CSF3R mutation -- Bone marrow biopsy does not show any evidence of underlying malignancy or concerning abnormalities. -- Discussed today the possible etiologies for her leukocytosis included the CSF3R mutation as well as untreated OSA. -- Encourage patient to follow-up with her sleep medicine doctor in order to further evaluate and consider treatment options including CPAP. -- Labs on 11/04/2022 show white blood cell count 16.7, hemoglobin 13.6, MCV 93.5, and platelets of 139 -- Recommend return to clinic in 6 months time for repeat evaluation.  No orders of the defined types were placed in this encounter.   All questions were answered. The patient knows to call the  clinic with any problems, questions or concerns.  A total of more than 40 minutes were spent on this encounter with face-to-face time and non-face-to-face time, including preparing to see the patient, ordering tests and/or medications, counseling the patient and coordination of care as outlined above.   Ulysees Barns, MD Department of Hematology/Oncology [facility] Phone: [phone] Pager: [phone] Email: [email or web address]  11/18/2022 4:11 PM

## 2022-11-19 ENCOUNTER — Encounter (HOSPITAL_COMMUNITY): Payer: Self-pay

## 2023-02-15 ENCOUNTER — Emergency Department (HOSPITAL_COMMUNITY): Payer: Medicare Other

## 2023-02-15 ENCOUNTER — Inpatient Hospital Stay (HOSPITAL_COMMUNITY)
Admission: EM | Admit: 2023-02-15 | Discharge: 2023-02-21 | DRG: 919 | Disposition: A | Discharge status: Home / Self Care | Payer: Medicare Other | Attending: Obstetrics and Gynecology | Admitting: Obstetrics and Gynecology

## 2023-02-15 ENCOUNTER — Other Ambulatory Visit: Payer: Self-pay

## 2023-02-15 DIAGNOSIS — Y848 Other medical procedures as the cause of abnormal reaction of the patient, or of later complication, without mention of misadventure at the time of the procedure: Secondary | ICD-10-CM | POA: Diagnosis present

## 2023-02-15 DIAGNOSIS — E872 Acidosis, unspecified: Secondary | ICD-10-CM | POA: Diagnosis present

## 2023-02-15 DIAGNOSIS — Z90710 Acquired absence of both cervix and uterus: Secondary | ICD-10-CM

## 2023-02-15 DIAGNOSIS — K922 Gastrointestinal hemorrhage, unspecified: Principal | ICD-10-CM

## 2023-02-15 DIAGNOSIS — K91840 Postprocedural hemorrhage and hematoma of a digestive system organ or structure following a digestive system procedure: Secondary | ICD-10-CM | POA: Diagnosis present

## 2023-02-15 DIAGNOSIS — R58 Hemorrhage, not elsewhere classified: Secondary | ICD-10-CM | POA: Diagnosis not present

## 2023-02-15 DIAGNOSIS — Z96651 Presence of right artificial knee joint: Secondary | ICD-10-CM | POA: Diagnosis present

## 2023-02-15 DIAGNOSIS — R570 Cardiogenic shock: Secondary | ICD-10-CM | POA: Diagnosis present

## 2023-02-15 DIAGNOSIS — D62 Acute posthemorrhagic anemia: Secondary | ICD-10-CM | POA: Diagnosis present

## 2023-02-15 DIAGNOSIS — D649 Anemia, unspecified: Secondary | ICD-10-CM

## 2023-02-15 DIAGNOSIS — D75839 Thrombocytosis, unspecified: Secondary | ICD-10-CM | POA: Diagnosis present

## 2023-02-15 DIAGNOSIS — E119 Type 2 diabetes mellitus without complications: Secondary | ICD-10-CM

## 2023-02-15 DIAGNOSIS — Z825 Family history of asthma and other chronic lower respiratory diseases: Secondary | ICD-10-CM

## 2023-02-15 DIAGNOSIS — K222 Esophageal obstruction: Secondary | ICD-10-CM | POA: Diagnosis present

## 2023-02-15 DIAGNOSIS — E785 Hyperlipidemia, unspecified: Secondary | ICD-10-CM | POA: Diagnosis present

## 2023-02-15 DIAGNOSIS — D696 Thrombocytopenia, unspecified: Secondary | ICD-10-CM | POA: Diagnosis present

## 2023-02-15 DIAGNOSIS — I4891 Unspecified atrial fibrillation: Secondary | ICD-10-CM | POA: Diagnosis present

## 2023-02-15 DIAGNOSIS — Z85828 Personal history of other malignant neoplasm of skin: Secondary | ICD-10-CM | POA: Diagnosis not present

## 2023-02-15 DIAGNOSIS — Z886 Allergy status to analgesic agent status: Secondary | ICD-10-CM

## 2023-02-15 DIAGNOSIS — Z800 Family history of malignant neoplasm of digestive organs: Secondary | ICD-10-CM

## 2023-02-15 DIAGNOSIS — Z887 Allergy status to serum and vaccine status: Secondary | ICD-10-CM

## 2023-02-15 DIAGNOSIS — I480 Paroxysmal atrial fibrillation: Secondary | ICD-10-CM | POA: Diagnosis present

## 2023-02-15 DIAGNOSIS — Z880 Allergy status to penicillin: Secondary | ICD-10-CM

## 2023-02-15 DIAGNOSIS — D6832 Hemorrhagic disorder due to extrinsic circulating anticoagulants: Secondary | ICD-10-CM | POA: Diagnosis present

## 2023-02-15 DIAGNOSIS — Z7985 Long-term (current) use of injectable non-insulin antidiabetic drugs: Secondary | ICD-10-CM

## 2023-02-15 DIAGNOSIS — Z833 Family history of diabetes mellitus: Secondary | ICD-10-CM | POA: Diagnosis not present

## 2023-02-15 DIAGNOSIS — Z7984 Long term (current) use of oral hypoglycemic drugs: Secondary | ICD-10-CM

## 2023-02-15 DIAGNOSIS — I959 Hypotension, unspecified: Secondary | ICD-10-CM | POA: Diagnosis present

## 2023-02-15 DIAGNOSIS — E663 Overweight: Secondary | ICD-10-CM | POA: Diagnosis present

## 2023-02-15 DIAGNOSIS — E1165 Type 2 diabetes mellitus with hyperglycemia: Secondary | ICD-10-CM | POA: Diagnosis present

## 2023-02-15 DIAGNOSIS — R578 Other shock: Secondary | ICD-10-CM | POA: Diagnosis present

## 2023-02-15 DIAGNOSIS — Z794 Long term (current) use of insulin: Secondary | ICD-10-CM

## 2023-02-15 DIAGNOSIS — I10 Essential (primary) hypertension: Secondary | ICD-10-CM | POA: Diagnosis present

## 2023-02-15 DIAGNOSIS — Z85820 Personal history of malignant melanoma of skin: Secondary | ICD-10-CM | POA: Diagnosis not present

## 2023-02-15 DIAGNOSIS — Z79899 Other long term (current) drug therapy: Secondary | ICD-10-CM

## 2023-02-15 DIAGNOSIS — Z7901 Long term (current) use of anticoagulants: Secondary | ICD-10-CM

## 2023-02-15 DIAGNOSIS — Z885 Allergy status to narcotic agent status: Secondary | ICD-10-CM

## 2023-02-15 DIAGNOSIS — E1140 Type 2 diabetes mellitus with diabetic neuropathy, unspecified: Secondary | ICD-10-CM | POA: Diagnosis present

## 2023-02-15 DIAGNOSIS — Z6828 Body mass index (BMI) 28.0-28.9, adult: Secondary | ICD-10-CM

## 2023-02-15 LAB — CBC WITH DIFFERENTIAL/PLATELET
Abs Immature Granulocytes: 0.3 10*3/uL — ABNORMAL HIGH (ref 0.00–0.07)
Basophils Absolute: 0 10*3/uL (ref 0.0–0.1)
Basophils Relative: 0 %
Eosinophils Absolute: 0 10*3/uL (ref 0.0–0.5)
Eosinophils Relative: 0 %
HCT: 20.4 % — ABNORMAL LOW (ref 36.0–46.0)
Hemoglobin: 6.7 g/dL — CL (ref 12.0–15.0)
Immature Granulocytes: 1 %
Lymphocytes Relative: 8 %
Lymphs Abs: 1.9 10*3/uL (ref 0.7–4.0)
MCH: 31.8 pg (ref 26.0–34.0)
MCHC: 32.8 g/dL (ref 30.0–36.0)
MCV: 96.7 fL (ref 80.0–100.0)
Monocytes Absolute: 1.3 10*3/uL — ABNORMAL HIGH (ref 0.1–1.0)
Monocytes Relative: 5 %
Neutro Abs: 20.8 10*3/uL — ABNORMAL HIGH (ref 1.7–7.7)
Neutrophils Relative %: 86 %
Platelets: 135 10*3/uL — ABNORMAL LOW (ref 150–400)
RBC: 2.11 MIL/uL — ABNORMAL LOW (ref 3.87–5.11)
RDW: 13.6 % (ref 11.5–15.5)
WBC: 24.3 10*3/uL — ABNORMAL HIGH (ref 4.0–10.5)
nRBC: 0.2 % (ref 0.0–0.2)

## 2023-02-15 LAB — URINALYSIS, W/ REFLEX TO CULTURE (INFECTION SUSPECTED)
Bacteria, UA: NONE SEEN
Bilirubin Urine: NEGATIVE
Glucose, UA: >=500 mg/dL — AB
Ketones, ur: 20 mg/dL — AB
Leukocytes,Ua: NEGATIVE
Nitrite: NEGATIVE
Protein, ur: NEGATIVE mg/dL
Specific Gravity, Urine: 1.024 (ref 1.005–1.030)
pH: 5 (ref 5.0–8.0)

## 2023-02-15 LAB — I-STAT VENOUS BLOOD GAS, ED
Acid-base deficit: 10 mmol/L — ABNORMAL HIGH (ref 0.0–2.0)
Bicarbonate: 15.1 mmol/L — ABNORMAL LOW (ref 20.0–28.0)
Calcium, Ion: 1.23 mmol/L (ref 1.15–1.40)
HCT: 18 % — ABNORMAL LOW (ref 36.0–46.0)
Hemoglobin: 6.1 g/dL — CL (ref 12.0–15.0)
O2 Saturation: 91 %
Potassium: 4 mmol/L (ref 3.5–5.1)
Sodium: 135 mmol/L (ref 135–145)
TCO2: 16 mmol/L — ABNORMAL LOW (ref 22–32)
pCO2, Ven: 28.6 mm[Hg] — ABNORMAL LOW (ref 44–60)
pH, Ven: 7.33 (ref 7.25–7.43)
pO2, Ven: 64 mm[Hg] — ABNORMAL HIGH (ref 32–45)

## 2023-02-15 LAB — COMPREHENSIVE METABOLIC PANEL
ALT: 11 U/L (ref 0–44)
AST: 16 U/L (ref 15–41)
Albumin: 2.8 g/dL — ABNORMAL LOW (ref 3.5–5.0)
Alkaline Phosphatase: 37 U/L — ABNORMAL LOW (ref 38–126)
Anion gap: 14 (ref 5–15)
BUN: 51 mg/dL — ABNORMAL HIGH (ref 8–23)
CO2: 15 mmol/L — ABNORMAL LOW (ref 22–32)
Calcium: 9 mg/dL (ref 8.9–10.3)
Chloride: 105 mmol/L (ref 98–111)
Creatinine, Ser: 0.87 mg/dL (ref 0.44–1.00)
GFR, Estimated: >60 mL/min (ref >60)
Glucose, Bld: 328 mg/dL — ABNORMAL HIGH (ref 70–99)
Potassium: 3.9 mmol/L (ref 3.5–5.1)
Sodium: 134 mmol/L — ABNORMAL LOW (ref 135–145)
Total Bilirubin: 0.7 mg/dL (ref 0.0–1.2)
Total Protein: 5 g/dL — ABNORMAL LOW (ref 6.5–8.1)

## 2023-02-15 LAB — CBG MONITORING, ED: Glucose-Capillary: 322 mg/dL — ABNORMAL HIGH (ref 70–99)

## 2023-02-15 LAB — I-STAT CHEM 8, ED
BUN: 48 mg/dL — ABNORMAL HIGH (ref 8–23)
Calcium, Ion: 1.23 mmol/L (ref 1.15–1.40)
Chloride: 105 mmol/L (ref 98–111)
Creatinine, Ser: 0.5 mg/dL (ref 0.44–1.00)
Glucose, Bld: 321 mg/dL — ABNORMAL HIGH (ref 70–99)
HCT: 19 % — ABNORMAL LOW (ref 36.0–46.0)
Hemoglobin: 6.5 g/dL — CL (ref 12.0–15.0)
Potassium: 4 mmol/L (ref 3.5–5.1)
Sodium: 135 mmol/L (ref 135–145)
TCO2: 16 mmol/L — ABNORMAL LOW (ref 22–32)

## 2023-02-15 LAB — I-STAT CG4 LACTIC ACID, ED: Lactic Acid, Venous: 5.3 mmol/L (ref 0.5–1.9)

## 2023-02-15 LAB — PREPARE RBC (CROSSMATCH)

## 2023-02-15 LAB — TROPONIN I (HIGH SENSITIVITY): Troponin I (High Sensitivity): 4 ng/L (ref <18)

## 2023-02-15 LAB — POC OCCULT BLOOD, ED: Fecal Occult Bld: POSITIVE — AB

## 2023-02-15 MED ORDER — DILTIAZEM HCL-DEXTROSE 125-5 MG/125ML-% IV SOLN (PREMIX)
5.0000 mg/h | INTRAVENOUS | Status: DC
  Filled 2023-02-15: qty 125

## 2023-02-15 MED ORDER — POLYETHYLENE GLYCOL 3350 17 G PO PACK
17.0000 g | PACK | Freq: Every day | ORAL | Status: DC | PRN
  Administered 2023-02-18 – 2023-02-19 (×2): 17 g via ORAL
  Filled 2023-02-15 (×2): qty 1

## 2023-02-15 MED ORDER — SODIUM CHLORIDE 0.9% IV SOLUTION: Freq: Once | INTRAVENOUS | Status: AC

## 2023-02-15 MED ORDER — PANTOPRAZOLE SODIUM 40 MG IV SOLR
40.0000 mg | Freq: Two times a day (BID) | INTRAVENOUS | Status: DC
  Administered 2023-02-16 – 2023-02-17 (×3): 40 mg via INTRAVENOUS
  Filled 2023-02-15 (×3): qty 10

## 2023-02-15 MED ORDER — INSULIN ASPART 100 UNIT/ML IJ SOLN
0.0000 [IU] | INTRAMUSCULAR | Status: DC
  Administered 2023-02-16 (×3): 2 [IU] via SUBCUTANEOUS
  Administered 2023-02-16: 5 [IU] via SUBCUTANEOUS
  Administered 2023-02-16: 1 [IU] via SUBCUTANEOUS
  Administered 2023-02-16: 5 [IU] via SUBCUTANEOUS
  Administered 2023-02-17: 3 [IU] via SUBCUTANEOUS
  Administered 2023-02-17: 1 [IU] via SUBCUTANEOUS

## 2023-02-15 MED ORDER — DOCUSATE SODIUM 100 MG PO CAPS
100.0000 mg | ORAL_CAPSULE | Freq: Two times a day (BID) | ORAL | Status: DC | PRN
  Administered 2023-02-18 – 2023-02-19 (×2): 100 mg via ORAL
  Filled 2023-02-15 (×2): qty 1

## 2023-02-15 MED ORDER — PANTOPRAZOLE SODIUM 40 MG IV SOLR: 40.0000 mg | Freq: Two times a day (BID) | INTRAVENOUS | Status: DC

## 2023-02-15 MED ORDER — CHLORHEXIDINE GLUCONATE CLOTH 2 % EX PADS
6.0000 | MEDICATED_PAD | Freq: Every day | CUTANEOUS | Status: DC
  Administered 2023-02-16 – 2023-02-20 (×3): 6 via TOPICAL

## 2023-02-15 MED ORDER — AMIODARONE LOAD VIA INFUSION
150.0000 mg | Freq: Once | INTRAVENOUS | Status: AC
  Administered 2023-02-15: 150 mg via INTRAVENOUS
  Filled 2023-02-15: qty 83.34

## 2023-02-15 MED ORDER — AMIODARONE HCL IN DEXTROSE 360-4.14 MG/200ML-% IV SOLN
30.0000 mg/h | INTRAVENOUS | Status: DC
  Administered 2023-02-16 (×2): 30 mg/h via INTRAVENOUS
  Filled 2023-02-15: qty 200

## 2023-02-15 MED ORDER — PANTOPRAZOLE SODIUM 40 MG IV SOLR
80.0000 mg | Freq: Once | INTRAVENOUS | Status: AC
  Administered 2023-02-15: 80 mg via INTRAVENOUS
  Filled 2023-02-15: qty 20

## 2023-02-15 MED ORDER — ORAL CARE MOUTH RINSE: 15.0000 mL | OROMUCOSAL | Status: DC | PRN

## 2023-02-15 MED ORDER — AMIODARONE HCL IN DEXTROSE 360-4.14 MG/200ML-% IV SOLN
60.0000 mg/h | INTRAVENOUS | Status: AC
  Administered 2023-02-15 – 2023-02-16 (×2): 60 mg/h via INTRAVENOUS
  Filled 2023-02-15 (×2): qty 200

## 2023-02-15 NOTE — H&P (Signed)
 NAME:  Erika [name], MRN:  [id number], DOB:  [birth year], LOS: 0 ADMISSION DATE:  02/15/2023, CONSULTATION DATE:  02/15/23 REFERRING MD:  EDP, CHIEF COMPLAINT:  acute GIB and encephalopathy   History of Present Illness:  68 yo female presented with confusion that has been progressively worsening since Sunday. Pt reportedly had colonoscopy and endoscopy on 1/3 and was home tired on 1/4. This did not improve and 1/5 pt worsened with weakness and overall less energy. Pt's daughter provides history as pt is altered at this time. She endorses that pt was becoming increasingly sob with walking. Denied c/o chest pain, fever/chills/cough/n/v/d but was noted to have darkening stools after her endoscopy.   Daughter states she was planning to transport pt to the ED but pt was unable to mobilize and thus EMS was contacted. Upon arrival she was noted to be in afib with RVR as well as hypotensive. She was given 1L Ivf. Hgb noted to be 6.1 from baseline of 13-14 (as of 9/24).  Pertinent  Medical History  Anxiety Hyperlipidemia Dm2 with hyperglycemia Chronic leukocytosis PAF on chronic eliquis Dysphagia with esophageal stricture Diabetic neuropathy Seasonal allergies    Significant Hospital Events: Including procedures, antibiotic start and stop dates in addition to other pertinent events   Admitted to ICU 1/7  Interim History / Subjective:    Objective   Blood pressure 125/86, pulse 99, temperature 98.3 F (36.8 C), temperature source Oral, resp. rate (!) 22, height 5' 4 (1.626 m), weight 72 kg, SpO2 100%.       No intake or output data in the 24 hours ending 02/15/23 2324 Filed Weights   02/15/23 2135  Weight: 72 kg    Examination: General: pale, female in nad laying supine in bed HENT: ncat, eomi, perrla, mm pale and dry Lungs: ctab Cardiovascular: irreg irreg Abdomen: soft, nt,nd BS ++ Extremities: no c/c/e Neuro: mildly altered but conversant, following commands, no focal  deficits GU: deferred.   Resolved Hospital Problem list     Assessment & Plan:  Acute GIB Acute hemorrhagic anemia thrombocytosis Hypotension 2/2 above Afib with rvr Lactic acidosis Hyponatremia Hyperglycemia with ketonuria Recent endoscopic procedure for esophageal stricture Acute metabolic encephalopathy -trend h&h q6 -bid ppi -GI consulted by EDP and will see in am.  -monitor BP closely, for now no need for pressors -cont with ivf and blood transfusion, getting 2 U PRBC -no acute indication for plts at this time -hold eliquis -trend lactate until downtrending -follow metabolic panel with transfusion and fluids for resolution of acidosis and hyponatremia -check hgb A1c, ssi otherwise q4 as pt will be npo -cth thankfully negative for acute process -with hypotension will start amio until able to control hr or pressure improves.   Best Practice (right click and Reselect all SmartList Selections daily)   Diet/type: NPO DVT prophylaxis SCD Pressure ulcer(s): see flowsheet assessment GI prophylaxis: PPI Lines: N/A Foley:  N/A Code Status:  full code Last date of multidisciplinary goals of care discussion [full]  Labs   CBC: Recent Labs  Lab 02/15/23 2138 02/15/23 2153  WBC 24.3*  --   NEUTROABS 20.8*  --   HGB 6.7* 6.1*  6.5*  HCT 20.4* 18.0*  19.0*  MCV 96.7  --   PLT 135*  --     Basic Metabolic Panel: Recent Labs  Lab 02/15/23 2138 02/15/23 2153  NA 134* 135  135  K 3.9 4.0  4.0  CL 105 105  CO2 15*  --  GLUCOSE 328* 321*  BUN 51* 48*  CREATININE 0.87 0.50  CALCIUM  9.0  --    GFR: Estimated Creatinine Clearance: 64.5 mL/min (by C-G formula based on SCr of 0.5 mg/dL). Recent Labs  Lab 02/15/23 2138 02/15/23 2211  WBC 24.3*  --   LATICACIDVEN  --  5.3*    Liver Function Tests: Recent Labs  Lab 02/15/23 2138  AST 16  ALT 11  ALKPHOS 37*  BILITOT 0.7  PROT 5.0*  ALBUMIN 2.8*   No results for input(s): LIPASE, AMYLASE in  the last 168 hours. No results for input(s): AMMONIA in the last 168 hours.  ABG    Component Value Date/Time   HCO3 15.1 (L) 02/15/2023 2153   TCO2 16 (L) 02/15/2023 2153   TCO2 16 (L) 02/15/2023 2153   ACIDBASEDEF 10.0 (H) 02/15/2023 2153   O2SAT 91 02/15/2023 2153     Coagulation Profile: No results for input(s): INR, PROTIME in the last 168 hours.  Cardiac Enzymes: No results for input(s): CKTOTAL, CKMB, CKMBINDEX, TROPONINI in the last 168 hours.  HbA1C: No results found for: HGBA1C  CBG: Recent Labs  Lab 02/15/23 2139  GLUCAP 322*    Review of Systems:   As per HPI  Past Medical History:  She,  has a past medical history of Anemia, Anxiety, Arthritis, Basal cell carcinoma of nasal tip, Cataract, Cramping of feet, Dysphagia, History of colon polyps, Hyperlipidemia, Hypertension, Insomnia, Joint pain, Joint swelling, Neuromuscular disorder (HCC), Neuropathy, Osteoarthritis of right knee (04/10/2013), Seasonal allergies, SVD (spontaneous vaginal delivery), Type II diabetes mellitus (HCC), and Urinary frequency.   Surgical History:   Past Surgical History:  Procedure Laterality Date   ANTERIOR AND POSTERIOR REPAIR N/A 02/06/2014   Procedure: ANTERIOR (CYSTOCELE) AND POSTERIOR REPAIR (RECTOCELE);  Surgeon: Krystal Deaner, MD;  Location: [facility];  Service: Gynecology;  Laterality: N/A;   BONE SPURS Left 1970's   ankle   CLOSED REDUCTION NASAL FRACTURE  ~ 2012   COLONOSCOPY     HYSTEROSCOPY WITH RESECTOSCOPE  2004   w/fibroids removed   JOINT REPLACEMENT     right knee   MELANOMA EXCISION  2014   tip of nose   TONSILLECTOMY  ~ 1959   TOTAL KNEE ARTHROPLASTY Right 04/10/2013   TOTAL KNEE ARTHROPLASTY Right 04/10/2013   Procedure: TOTAL KNEE ARTHROPLASTY;  Surgeon: Fonda SHAUNNA Olmsted, MD;  Location: [facility];  Service: Orthopedics;  Laterality: Right;   VAGINAL HYSTERECTOMY N/A 02/06/2014   Procedure: HYSTERECTOMY VAGINAL;  Surgeon: Krystal Deaner, MD;   Location: [facility];  Service: Gynecology;  Laterality: N/A;     Social History:   reports that she has never smoked. She has never used smokeless tobacco. She reports current alcohol  use of about 3.0 standard drinks of alcohol  per week. She reports that she does not use drugs.   Family History:  Her family history includes Bronchitis in her mother; Diabetes in her sister; Liver cancer in her father.   Allergies Allergies  Allergen Reactions   Amoxicillin     Only one case of HIVES.  Has taken it in the past.   Fluticasone Propionate     Other reaction(s): foot cramps-mild   Nsaids Nausea And Vomiting   Tetanus Toxoids Other (See Comments)    Very high fever   Tramadol  Hcl     Other reaction(s): agitated     Home Medications  Prior to Admission medications   Medication Sig Start Date End Date Taking? Authorizing Provider  ALPRAZolam  (XANAX ) 0.25  MG tablet Take 0.25 mg by mouth at bedtime as needed. 09/10/19   [provider]  ARTIFICIAL TEARS 0.1-0.3 % SOLN Place 2 drops into both eyes 3 (three) times daily.     [provider]  Calcium  Carb-Cholecalciferol 600-800 MG-UNIT TABS Take 1 tablet by mouth 2 (two) times daily.    [provider]  cetirizine (ZYRTEC) 10 MG tablet Take 10 mg by mouth daily.    [provider]  DILT-XR 120 MG 24 hr capsule Take 120 mg by mouth daily. 10/10/19   [provider]  diphenhydrAMINE  (BENADRYL ) 25 mg capsule Take 25-50 mg by mouth at bedtime.    [provider]  Dulaglutide  (TRULICITY ) 3 MG/0.5ML SOPN Inject 3 mg into the skin once a week. 06/07/22     ELIQUIS 5 MG TABS tablet Take 5 mg by mouth 2 (two) times daily. 10/10/19   [provider]  escitalopram  (LEXAPRO ) 10 MG tablet Take 1 tablet by mouth daily. 10/21/19   [provider]  fluticasone (FLONASE) 50 MCG/ACT nasal spray Place 1 spray into both nostrils daily. 08/03/19   [provider]  FREESTYLE LITE test strip 1 each  by Other route as needed. 08/21/19   [provider]  insulin  glargine, 1 Unit Dial , (TOUJEO  SOLOSTAR) 300 UNIT/ML Solostar Pen Inject into the skin.    [provider]  lisinopril  (PRINIVIL ,ZESTRIL ) 2.5 MG tablet Take 2.5 mg by mouth every morning.     [provider]  metFORMIN  (GLUCOPHAGE -XR) 500 MG 24 hr tablet Take 1,000 mg by mouth 2 (two) times daily. 10/07/19   [provider]  montelukast  (SINGULAIR ) 10 MG tablet Take 10 mg by mouth daily. 08/20/19   [provider]  Omega-3 Fatty Acids (FISH OIL PO) Take 1 tablet by mouth daily.    [provider]  Potassium Gluconate 550 MG TABS Take 2 tablets by mouth every evening.     [provider]  rosuvastatin  (CRESTOR ) 10 MG tablet Take 5 mg by mouth every evening.     [provider]  TRULICITY  3 MG/0.5ML SOPN SMARTSIG:3 Milligram(s) SUB-Q Once a Week    [provider]     Critical care time: 

## 2023-02-15 NOTE — ED Provider Notes (Signed)
 [facility] AT [facility] Provider Note   CSN: [id number] Arrival date & time: 02/15/23  2127     History  Chief Complaint  Patient presents with   Afib RVR    Erika [name] is a 68 y.o. female.  HPI 68 year old female presents with weakness and confusion.  History is somewhat from the patient but mostly from the daughter, who lives with the patient.  The patient had a colonoscopy and endoscopy on 1/3.  On 1/4 she was a little tired but did fine.  On 1/5 she seemed to be getting weak and was having a hard time getting out of bed.  Less energy but also diffuse weakness.  She seemed a little short of breath when she was walking.  Symptoms have progressively gotten worse.  She was also having some dark stools and was recommended to come to the ER for evaluation.  She has not had fever or cough.  Patient denies any chest pain or headache.  The patient has a history of short-term memory issues which are exacerbated and worse since the endoscopies.  Tonight the patient was so weak she could not walk up the driveway so the daughter called EMS as opposed to driving her here.  They found her to be in A-fib with RVR and she had some soft pressures under 100.  They have given her 1 L of fluid.  Her glucose was also elevated in the 400s.  Home Medications Prior to Admission medications   Medication Sig Start Date End Date Taking? Authorizing Provider  ALPRAZolam  (XANAX ) 0.25 MG tablet Take 0.25 mg by mouth at bedtime as needed. 09/10/19   [provider]  ARTIFICIAL TEARS 0.1-0.3 % SOLN Place 2 drops into both eyes 3 (three) times daily.     [provider]  Calcium  Carb-Cholecalciferol 600-800 MG-UNIT TABS Take 1 tablet by mouth 2 (two) times daily.    [provider]  cetirizine (ZYRTEC) 10 MG tablet Take 10 mg by mouth daily.    [provider]  DILT-XR 120 MG 24 hr capsule Take 120 mg by mouth daily. 10/10/19   [provider]   diphenhydrAMINE  (BENADRYL ) 25 mg capsule Take 25-50 mg by mouth at bedtime.    [provider]  Dulaglutide  (TRULICITY ) 3 MG/0.5ML SOPN Inject 3 mg into the skin once a week. 06/07/22     ELIQUIS 5 MG TABS tablet Take 5 mg by mouth 2 (two) times daily. 10/10/19   [provider]  escitalopram  (LEXAPRO ) 10 MG tablet Take 1 tablet by mouth daily. 10/21/19   [provider]  fluticasone (FLONASE) 50 MCG/ACT nasal spray Place 1 spray into both nostrils daily. 08/03/19   [provider]  FREESTYLE LITE test strip 1 each by Other route as needed. 08/21/19   [provider]  insulin  glargine, 1 Unit Dial , (TOUJEO  SOLOSTAR) 300 UNIT/ML Solostar Pen Inject into the skin.    [provider]  lisinopril  (PRINIVIL ,ZESTRIL ) 2.5 MG tablet Take 2.5 mg by mouth every morning.     [provider]  metFORMIN  (GLUCOPHAGE -XR) 500 MG 24 hr tablet Take 1,000 mg by mouth 2 (two) times daily. 10/07/19   [provider]  montelukast  (SINGULAIR ) 10 MG tablet Take 10 mg by mouth daily. 08/20/19   [provider]  Omega-3 Fatty Acids (FISH OIL PO) Take 1 tablet by mouth daily.    [provider]  Potassium Gluconate 550 MG TABS Take 2 tablets by  mouth every evening.     [provider]  rosuvastatin  (CRESTOR ) 10 MG tablet Take 5 mg by mouth every evening.     [provider]  TRULICITY  3 MG/0.5ML SOPN SMARTSIG:3 Milligram(s) SUB-Q Once a Week    [provider]      Allergies    Amoxicillin, Fluticasone propionate, Nsaids, Tetanus toxoids, and Tramadol  hcl    Review of Systems   Review of Systems  Constitutional:  Positive for fatigue. Negative for fever.  Respiratory:  Positive for shortness of breath.   Cardiovascular:  Negative for chest pain.  Gastrointestinal:  Negative for vomiting.  Neurological:  Positive for weakness. Negative for headaches.    Physical Exam Updated Vital Signs BP 125/86   Pulse  99   Temp 98.3 F (36.8 C) (Oral)   Resp (!) 22   Ht 5' 4 (1.626 m)   Wt 72 kg   SpO2 100%   BMI 27.25 kg/m  Physical Exam Vitals and nursing note reviewed. Exam conducted with a chaperone present.  Constitutional:      Appearance: She is well-developed.  HENT:     Head: Normocephalic and atraumatic.     Mouth/Throat:     Mouth: Mucous membranes are dry.  Eyes:     Extraocular Movements: Extraocular movements intact.     Pupils: Pupils are equal, round, and reactive to light.  Cardiovascular:     Rate and Rhythm: Tachycardia present. Rhythm irregular.     Heart sounds: Normal heart sounds.  Pulmonary:     Effort: Pulmonary effort is normal.     Breath sounds: Normal breath sounds. No wheezing.  Abdominal:     General: There is no distension.     Palpations: Abdomen is soft.     Tenderness: There is no abdominal tenderness.  Genitourinary:    Rectum: Guaiac result positive.     Comments: Black stool that is heme-positive Musculoskeletal:     Cervical back: No rigidity.  Skin:    General: Skin is warm and dry.  Neurological:     Mental Status: She is alert.     Comments: Patient has a hard time explaining her symptoms and asks her daughter to explain. Seems to have a hard time getting the thing she wants to say. CN 3-12 grossly intact. 5/5 strength in all 4 extremities. Grossly normal sensation. Normal finger to nose.      ED Results / Procedures / Treatments   Labs (all labs ordered are listed, but only abnormal results are displayed) Labs Reviewed  COMPREHENSIVE METABOLIC PANEL - Abnormal; Notable for the following components:      Result Value   Sodium 134 (*)    CO2 15 (*)    Glucose, Bld 328 (*)    BUN 51 (*)    Total Protein 5.0 (*)    Albumin 2.8 (*)    Alkaline Phosphatase 37 (*)    All other components within normal limits  CBC WITH DIFFERENTIAL/PLATELET - Abnormal; Notable for the following components:   WBC 24.3 (*)    RBC 2.11 (*)    Hemoglobin  6.7 (*)    HCT 20.4 (*)    Platelets 135 (*)    Neutro Abs 20.8 (*)    Monocytes Absolute 1.3 (*)    Abs Immature Granulocytes 0.30 (*)    All other components within normal limits  URINALYSIS, W/ REFLEX TO CULTURE (INFECTION SUSPECTED) - Abnormal; Notable for the following components:   Color, Urine STRAW (*)  Glucose, UA >=500 (*)    Hgb urine dipstick MODERATE (*)    Ketones, ur 20 (*)    All other components within normal limits  I-STAT CHEM 8, ED - Abnormal; Notable for the following components:   BUN 48 (*)    Glucose, Bld 321 (*)    TCO2 16 (*)    Hemoglobin 6.5 (*)    HCT 19.0 (*)    All other components within normal limits  CBG MONITORING, ED - Abnormal; Notable for the following components:   Glucose-Capillary 322 (*)    All other components within normal limits  I-STAT VENOUS BLOOD GAS, ED - Abnormal; Notable for the following components:   pCO2, Ven 28.6 (*)    pO2, Ven 64 (*)    Bicarbonate 15.1 (*)    TCO2 16 (*)    Acid-base deficit 10.0 (*)    HCT 18.0 (*)    Hemoglobin 6.1 (*)    All other components within normal limits  POC OCCULT BLOOD, ED - Abnormal; Notable for the following components:   Fecal Occult Bld POSITIVE (*)    All other components within normal limits  I-STAT CG4 LACTIC ACID, ED - Abnormal; Notable for the following components:   Lactic Acid, Venous 5.3 (*)    All other components within normal limits  TYPE AND SCREEN  PREPARE RBC (CROSSMATCH)  TROPONIN I (HIGH SENSITIVITY)    EKG None  Radiology CT Head Wo Contrast Result Date: 02/15/2023 CLINICAL DATA:  Mental status change, unknown cause EXAM: CT HEAD WITHOUT CONTRAST TECHNIQUE: Contiguous axial images were obtained from the base of the skull through the vertex without intravenous contrast. RADIATION DOSE REDUCTION: This exam was performed according to the departmental dose-optimization program which includes automated exposure control, adjustment of the mA and/or kV according to  patient size and/or use of iterative reconstruction technique. COMPARISON:  None Available. FINDINGS: Brain: There is atrophy and chronic small vessel disease changes. No acute intracranial abnormality. Specifically, no hemorrhage, hydrocephalus, mass lesion, acute infarction, or significant intracranial injury. Vascular: No hyperdense vessel or unexpected calcification. Skull: No acute calvarial abnormality. Sinuses/Orbits: No acute findings Other: None IMPRESSION: Atrophy, chronic microvascular disease. No acute intracranial abnormality. Electronically Signed   By: Franky Crease M.D.   On: 02/15/2023 22:34   DG Chest Portable 1 View Result Date: 02/15/2023 CLINICAL DATA:  Weakness, atrial fibrillation. Kidney along with your brother you think EXAM: PORTABLE CHEST 1 VIEW COMPARISON:  None Available. FINDINGS: The heart size and mediastinal contours are within normal limits. Both lungs are clear. The visualized skeletal structures are unremarkable. IMPRESSION: No active disease. Electronically Signed   By: Greig Pique M.D.   On: 02/15/2023 21:50    Procedures .Critical Care  Performed by: Freddi Hamilton, MD Authorized by: Freddi Hamilton, MD   Critical care provider statement:    Critical care time (minutes):  45   Critical care time was exclusive of:  Separately billable procedures and treating other patients   Critical care was necessary to treat or prevent imminent or life-threatening deterioration of the following conditions:  Circulatory failure and shock   Critical care was time spent personally by me on the following activities:  Development of treatment plan with patient or surrogate, discussions with consultants, evaluation of patient's response to treatment, examination of patient, ordering and review of laboratory studies, ordering and review of radiographic studies, ordering and performing treatments and interventions, pulse oximetry, re-evaluation of patient's condition and review of old  charts  Medications Ordered in ED Medications  0.9 %  sodium chloride  infusion (Manually program via Guardrails IV Fluids) (has no administration in time range)  pantoprazole  (PROTONIX ) injection 80 mg (has no administration in time range)  diltiazem  (CARDIZEM ) 125 mg in dextrose  5% 125 mL (1 mg/mL) infusion (has no administration in time range)    ED Course/ Medical Decision Making/ A&P                                 Medical Decision Making Amount and/or Complexity of Data Reviewed Independent Historian:     Details: Daughter Labs: ordered.    Details: Anemia, hemoglobin at 6.5.  Lactate 5 Radiology: ordered and independent interpretation performed.    Details: No CHF.  No head bleed. ECG/medicine tests: ordered and independent interpretation performed.    Details: A-fib with RVR  Risk Prescription drug management. Decision regarding hospitalization.   Patient presents with weakness and is found to have acute GI bleed.  She is in A-fib with RVR which I think is both related to A-fib but also secondary to her new anemia.  She will be started on a blood transfusion.  I have given her IV Protonix .  She is otherwise resting comfortably.  She has had mostly normal blood pressures but a few soft blood pressures.  She was fluid responsive for EMS and I think the blood will help with this.  Had originally planned on starting diltiazem  but because of some soft blood pressures we will switch to amiodarone  after discussing with ICU, Dr. Layman.  Patient also has a lactate which I presume is secondary to the need for blood in the A-fib.  She does not have any signs of an obvious infection.  I have discussed with GI, Dr. Alejandra, who will consult, but no indication for emergent intervention at this time.  She will be admitted to the ICU.        Final Clinical Impression(s) / ED Diagnoses Final diagnoses:  Acute GI bleeding  Symptomatic anemia  Atrial fibrillation with RVR (HCC)     Rx / DC Orders ED Discharge Orders     None         Freddi Hamilton, MD 02/15/23 2328

## 2023-02-15 NOTE — ED Notes (Signed)
Notified RN of BP

## 2023-02-15 NOTE — ED Notes (Signed)
 Patient transported to CT

## 2023-02-15 NOTE — ED Triage Notes (Signed)
 Pt BIB [facility] from home. Family called EMS reporting pt has increased confusion, thirst and weakness since Sunday. CBG 457, and HR 120-180s. BP 112/50

## 2023-02-16 ENCOUNTER — Encounter (HOSPITAL_COMMUNITY): Payer: Self-pay | Admitting: Critical Care Medicine

## 2023-02-16 DIAGNOSIS — K922 Gastrointestinal hemorrhage, unspecified: Secondary | ICD-10-CM | POA: Diagnosis not present

## 2023-02-16 LAB — BASIC METABOLIC PANEL
Anion gap: 7 (ref 5–15)
BUN: 35 mg/dL — ABNORMAL HIGH (ref 8–23)
CO2: 24 mmol/L (ref 22–32)
Calcium: 8.7 mg/dL — ABNORMAL LOW (ref 8.9–10.3)
Chloride: 106 mmol/L (ref 98–111)
Creatinine, Ser: 0.54 mg/dL (ref 0.44–1.00)
GFR, Estimated: >60 mL/min (ref >60)
Glucose, Bld: 184 mg/dL — ABNORMAL HIGH (ref 70–99)
Potassium: 3.3 mmol/L — ABNORMAL LOW (ref 3.5–5.1)
Sodium: 137 mmol/L (ref 135–145)

## 2023-02-16 LAB — APTT: aPTT: 31 s (ref 24–36)

## 2023-02-16 LAB — I-STAT CG4 LACTIC ACID, ED: Lactic Acid, Venous: 5.3 mmol/L (ref 0.5–1.9)

## 2023-02-16 LAB — LACTIC ACID, PLASMA: Lactic Acid, Venous: 1.3 mmol/L (ref 0.5–1.9)

## 2023-02-16 LAB — CORTISOL: Cortisol, Plasma: 16.4 ug/dL

## 2023-02-16 LAB — GLUCOSE, CAPILLARY
Glucose-Capillary: 148 mg/dL — ABNORMAL HIGH (ref 70–99)
Glucose-Capillary: 175 mg/dL — ABNORMAL HIGH (ref 70–99)
Glucose-Capillary: 184 mg/dL — ABNORMAL HIGH (ref 70–99)
Glucose-Capillary: 196 mg/dL — ABNORMAL HIGH (ref 70–99)
Glucose-Capillary: 245 mg/dL — ABNORMAL HIGH (ref 70–99)
Glucose-Capillary: 262 mg/dL — ABNORMAL HIGH (ref 70–99)
Glucose-Capillary: 293 mg/dL — ABNORMAL HIGH (ref 70–99)

## 2023-02-16 LAB — CBC
HCT: 22 % — ABNORMAL LOW (ref 36.0–46.0)
Hemoglobin: 7.8 g/dL — ABNORMAL LOW (ref 12.0–15.0)
MCH: 32 pg (ref 26.0–34.0)
MCHC: 35.5 g/dL (ref 30.0–36.0)
MCV: 90.2 fL (ref 80.0–100.0)
Platelets: 111 10*3/uL — ABNORMAL LOW (ref 150–400)
RBC: 2.44 MIL/uL — ABNORMAL LOW (ref 3.87–5.11)
RDW: 14.2 % (ref 11.5–15.5)
WBC: 20.4 10*3/uL — ABNORMAL HIGH (ref 4.0–10.5)
nRBC: 0.4 % — ABNORMAL HIGH (ref 0.0–0.2)

## 2023-02-16 LAB — HEMOGLOBIN AND HEMATOCRIT, BLOOD
HCT: 20.3 % — ABNORMAL LOW (ref 36.0–46.0)
HCT: 23.5 % — ABNORMAL LOW (ref 36.0–46.0)
HCT: 23.8 % — ABNORMAL LOW (ref 36.0–46.0)
Hemoglobin: 6.6 g/dL — CL (ref 12.0–15.0)
Hemoglobin: 8.1 g/dL — ABNORMAL LOW (ref 12.0–15.0)
Hemoglobin: 8.2 g/dL — ABNORMAL LOW (ref 12.0–15.0)

## 2023-02-16 LAB — HIV ANTIBODY (ROUTINE TESTING W REFLEX): HIV Screen 4th Generation wRfx: NONREACTIVE

## 2023-02-16 LAB — MRSA NEXT GEN BY PCR, NASAL: MRSA by PCR Next Gen: NOT DETECTED

## 2023-02-16 LAB — MAGNESIUM: Magnesium: 1.5 mg/dL — ABNORMAL LOW (ref 1.7–2.4)

## 2023-02-16 LAB — PROTIME-INR
INR: 2.2 — ABNORMAL HIGH (ref 0.8–1.2)
Prothrombin Time: 25 s — ABNORMAL HIGH (ref 11.4–15.2)

## 2023-02-16 LAB — TROPONIN I (HIGH SENSITIVITY): Troponin I (High Sensitivity): 4 ng/L (ref <18)

## 2023-02-16 MED ORDER — SODIUM CHLORIDE 0.9% IV SOLUTION: Freq: Once | INTRAVENOUS | Status: DC

## 2023-02-16 MED ORDER — BOOST / RESOURCE BREEZE PO LIQD CUSTOM
1.0000 | Freq: Three times a day (TID) | ORAL | Status: DC
Start: 2023-02-16 — End: 2023-02-21
  Administered 2023-02-16: 237 mL via ORAL
  Administered 2023-02-17 – 2023-02-20 (×10): 1 via ORAL

## 2023-02-16 MED ORDER — LACTATED RINGERS IV SOLN
INTRAVENOUS | Status: AC
Start: 2023-02-16 — End: 2023-02-16

## 2023-02-16 MED ORDER — MAGNESIUM SULFATE 2 GM/50ML IV SOLN
2.0000 g | Freq: Once | INTRAVENOUS | Status: AC
  Administered 2023-02-16: 2 g via INTRAVENOUS
  Filled 2023-02-16: qty 50

## 2023-02-16 MED ORDER — ONDANSETRON HCL 4 MG/2ML IJ SOLN: 4.0000 mg | Freq: Once | INTRAMUSCULAR | Status: DC

## 2023-02-16 MED ORDER — POTASSIUM CHLORIDE 10 MEQ/100ML IV SOLN
10.0000 meq | INTRAVENOUS | Status: AC
  Administered 2023-02-16 (×2): 10 meq via INTRAVENOUS
  Filled 2023-02-16 (×2): qty 100

## 2023-02-16 MED ORDER — AMIODARONE HCL IN DEXTROSE 360-4.14 MG/200ML-% IV SOLN
30.0000 mg/h | INTRAVENOUS | Status: AC
  Administered 2023-02-16 (×2): 30 mg/h via INTRAVENOUS
  Filled 2023-02-16 (×2): qty 200

## 2023-02-16 NOTE — Progress Notes (Addendum)
 NAME:  Erika [name], MRN:  [id number], DOB:  Nov 22, 1955, LOS: 1 ADMISSION DATE:  02/15/2023, CONSULTATION DATE:  02/15/23 REFERRING MD:  EDP, CHIEF COMPLAINT:  acute GIB and encephalopathy   History of Present Illness:  68 yo female presented with confusion that has been progressively worsening since Sunday. Pt reportedly had colonoscopy and endoscopy on 1/3 and was home tired on 1/4. This did not improve and 1/5 pt worsened with weakness and overall less energy. Pt's daughter provides history as pt is altered at this time. She endorses that pt was becoming increasingly sob with walking. Denied c/o chest pain, fever/chills/cough/n/v/d but was noted to have darkening stools after her endoscopy.   Daughter states she was planning to transport pt to the ED but pt was unable to mobilize and thus EMS was contacted. Upon arrival she was noted to be in afib with RVR as well as hypotensive. She was given 1L Ivf. Hgb noted to be 6.1 from baseline of 13-14 (as of 9/24).  Pertinent  Medical History  Anxiety Hyperlipidemia Dm2 with hyperglycemia Chronic leukocytosis PAF on chronic eliquis Dysphagia with esophageal stricture Diabetic neuropathy Seasonal allergies  Significant Hospital Events: Including procedures, antibiotic start and stop dates in addition to other pertinent events   Admitted to ICU 1/7- Hgb 6.5 with dark tarry stools, mildly hypotensive with Afib w RVR  Interim History / Subjective:  Heart rates 100-130s. Received 2 unit pRBCs. Post H/H pending. No bowel movements overnight.  Daughter at bedside this AM. Patient is alert and oriented x3, back to baseline per daughter. She denies abdominal pain. Endorses dark tar like stools since 1/4.  Objective   Blood pressure 110/61, pulse 100, temperature 98.5 F (36.9 C), temperature source Oral, resp. rate (!) 21, height 5' 4 (1.626 m), weight 71.3 kg, SpO2 99%.        Intake/Output Summary (Last 24 hours) at 02/16/2023 9367 Last data  filed at 02/16/2023 9392 Gross per 24 hour  Intake 1551.62 ml  Output 300 ml  Net 1251.62 ml   Filed Weights   02/15/23 2135 02/16/23 0037 02/16/23 0500  Weight: 72 kg 71.3 kg 71.3 kg   Urine OP 300cc Net +1.2L  Examination: General: pale, female in nad laying supine in bed HENT: ncat, eomi, perrla, mm pale and dry Lungs: ctab Cardiovascular: irreg irreg, no murmurs Abdomen: soft, nt,nd BS ++ Extremities: no c/c/e Neuro: alert and oriented x 3  Lactate 5.3-> 1.3 Mg 1.5 INR 2.2  UA moderate hgb and ketones  CMP 01/07 Na 137 K 3.3 Bicarb 15-> 24 BUN 35  Resolved Hospital Problem list   Acute metabolic encephalopathy  Assessment & Plan:   Acute upper GI B Recent endoscopic procedure for esophageal stricture, 01/03 Thrombocytopenia Presentation concerning for perforation. S/p 2 units pRBCs overnight. Repeat h/h at 7.5 concerning for continued bleed. [facility] consulted in ED. Dr. Burnette to see this AM.  Blood pressures remain stable.   -PPI BID IV -trend h&h q6 -NPO -hold eliquis, last dose 01/07 PM -continue fluids  Lactic acidosis Anion gap metabolic acidosis Non-anion gap metabolic acidosis Anion gap in setting of lactic acidosis and uremia.  -trend BMP, AM labs pending -lactate normalized  Afib with rvr Heart rates 100-130s. Blood pressures stable.  - continue amiodarone  drip  Diabetes Hyperglycemia with ketonuria Glucose 262. Last endo note lists home meds with toujeo  22 units every day, trulicity . Not on SGLT2. UA did have ketones with hyperglycemia. She does have Anion gap metabolic acidosis  but I think this is more likely explained by lactic acidosis and uremia than true DKA.  -check hgb A1c, ssi otherwise q4 as pt will be npo  Acute metabolic encephalopathy CTH negative 01/07. Resolved. She is alert and oriented x3. Back to baseline.  Best Practice (right click and Reselect all SmartList Selections daily)   Diet/type: NPO DVT prophylaxis  SCD Pressure ulcer(s): see flowsheet assessment GI prophylaxis: PPI Lines: N/A Foley:  N/A Code Status:  full code Last date of multidisciplinary goals of care discussion [patient and daughter updated at bedside]  Erika [name]. Erika [name], D.O.  Internal Medicine Resident, PGY-3 [name]  Pager: [phone] 6:36 AM, 02/16/2023

## 2023-02-16 NOTE — Consult Note (Signed)
 [facility] Consultation Note  Referring Provider: [facility] Primary Care Physician:  Cleotilde, [place]  E, PA Primary Gastroenterologist:  Saintclair  Reason for Consultation:  melena  HPI: Erika [name] is a 68 y.o. female admitted anemia, tachycardia, atrial fibrillation, melena.  All started couple days after egd/colonoscopy with Dr. Saintclair.  EGD esophageal dilatation and esophageal biopsies otherwise normal; no hematemesis.  Colonoscopy 3 proximal colonic polyps sub-cm removed cold snare.  Started eliquis back one day after procedure which is also one day before bleeding started.     Past Medical History:  Diagnosis Date   Anemia    just related to fibroids   Anxiety    takes Clonazepam  daily as needed   Arthritis    wrists; neck (04/10/2013)   Basal cell carcinoma of nasal tip    Cataract    bilateral   Cramping of feet    takes Potassium daily   Dysphagia    History of colon polyps    Hyperlipidemia    takes Crestor  nightly because I'm a diabetic   Hypertension    Insomnia    takes Benadryl  nightly   Joint pain    Joint swelling    Neuromuscular disorder (HCC)    feet   Neuropathy    both feet   Osteoarthritis of right knee 04/10/2013   Seasonal allergies    takes Omnaris and Claritin  daily as needed   SVD (spontaneous vaginal delivery)    x 1   Type II diabetes mellitus (HCC)    takes Janumet  daily   Urinary frequency     Past Surgical History:  Procedure Laterality Date   ANTERIOR AND POSTERIOR REPAIR N/A 02/06/2014   Procedure: ANTERIOR (CYSTOCELE) AND POSTERIOR REPAIR (RECTOCELE);  Surgeon: Krystal Deaner, MD;  Location: [facility];  Service: Gynecology;  Laterality: N/A;   BONE SPURS Left 1970's   ankle   CLOSED REDUCTION NASAL FRACTURE  ~ 2012   COLONOSCOPY     HYSTEROSCOPY WITH RESECTOSCOPE  2004   w/fibroids removed   JOINT REPLACEMENT     right knee   MELANOMA EXCISION  2014   tip of nose   TONSILLECTOMY  ~ 1959   TOTAL  KNEE ARTHROPLASTY Right 04/10/2013   TOTAL KNEE ARTHROPLASTY Right 04/10/2013   Procedure: TOTAL KNEE ARTHROPLASTY;  Surgeon: Fonda SHAUNNA Olmsted, MD;  Location: [facility];  Service: Orthopedics;  Laterality: Right;   VAGINAL HYSTERECTOMY N/A 02/06/2014   Procedure: HYSTERECTOMY VAGINAL;  Surgeon: Krystal Deaner, MD;  Location: [facility];  Service: Gynecology;  Laterality: N/A;    Prior to Admission medications   Medication Sig Start Date End Date Taking? Authorizing Provider  ALPRAZolam  (XANAX ) 0.25 MG tablet Take 0.25 mg by mouth at bedtime as needed. 09/10/19  Yes [provider]  ARTIFICIAL TEARS 0.1-0.3 % SOLN Place 2 drops into both eyes daily as needed (for dry eye).   Yes [provider]  Calcium  Carb-Cholecalciferol 600-800 MG-UNIT TABS Take 1 tablet by mouth 2 (two) times daily.   Yes [provider]  cetirizine (ZYRTEC) 10 MG tablet Take 10 mg by mouth daily.   Yes [provider]  DILT-XR 120 MG 24 hr capsule Take 120 mg by mouth daily. 10/10/19  Yes [provider]  Dulaglutide  (TRULICITY ) 3 MG/0.5ML SOPN Inject 3 mg into the skin once a week. 06/07/22  Yes   ELIQUIS 5 MG TABS tablet Take 5 mg by mouth 2 (two) times daily. 10/10/19  Yes [provider]  escitalopram  (  LEXAPRO ) 10 MG tablet Take 1 tablet by mouth daily. 10/21/19  Yes [provider]  insulin  glargine, 1 Unit Dial , (TOUJEO  SOLOSTAR) 300 UNIT/ML Solostar Pen Inject 40 Units into the skin daily.   Yes [provider]  lisinopril  (PRINIVIL ,ZESTRIL ) 2.5 MG tablet Take 2.5 mg by mouth every morning.    Yes [provider]  metFORMIN  (GLUCOPHAGE -XR) 500 MG 24 hr tablet Take 1,000 mg by mouth 2 (two) times daily. 10/07/19  Yes [provider]  montelukast  (SINGULAIR ) 10 MG tablet Take 10 mg by mouth daily. 08/20/19  Yes [provider]  Omega-3 Fatty Acids (FISH OIL PO) Take 1 tablet by mouth daily.   Yes [provider]  Potassium Gluconate  550 MG TABS Take 2 tablets by mouth every evening.    Yes [provider]  rosuvastatin  (CRESTOR ) 5 MG tablet Take 5 mg by mouth daily.   Yes [provider]  FREESTYLE LITE test strip 1 each by Other route as needed. 08/21/19   [provider]    Current Facility-Administered Medications  Medication Dose Route Frequency Provider Last Rate Last Admin   0.9 %  sodium chloride  infusion (Manually program via Guardrails IV Fluids)   Intravenous Once Paliwal, Aditya, MD       amiodarone  (NEXTERONE  PREMIX) 360-4.14 MG/200ML-% (1.8 mg/mL) IV infusion  30 mg/hr Intravenous Continuous Freddi Hamilton, MD 16.67 mL/hr at 02/16/23 1200 30 mg/hr at 02/16/23 1200   Chlorhexidine  Gluconate Cloth 2 % PADS 6 each  6 each Topical Daily Layman Raisin, DO   6 each at 02/16/23 9090   docusate sodium  (COLACE) capsule 100 mg  100 mg Oral BID PRN Layman Raisin, DO       insulin  aspart (novoLOG ) injection 0-9 Units  0-9 Units Subcutaneous Q4H Layman Raisin, DO   1 Units at 02/16/23 1122   lactated ringers  infusion   Intravenous Continuous Masters, Katie, DO 100 mL/hr at 02/16/23 1200 Infusion Verify at 02/16/23 1200   Oral care mouth rinse  15 mL Mouth Rinse PRN Layman Raisin, DO       pantoprazole  (PROTONIX ) injection 40 mg  40 mg Intravenous Q12H Layman Raisin, DO   40 mg at 02/16/23 9093   polyethylene glycol (MIRALAX  / GLYCOLAX ) packet 17 g  17 g Oral Daily PRN Layman Raisin, DO       potassium chloride  10 mEq in 100 mL IVPB  10 mEq Intravenous Q1 Hr x 2 Masters, Katie, DO 50 mL/hr at 02/16/23 1200 Infusion Verify at 02/16/23 1200    Allergies as of 02/15/2023 - Review Complete 02/15/2023  Allergen Reaction Noted   Amoxicillin  03/23/2013   Fluticasone propionate  06/04/2021   Nsaids Nausea And Vomiting 03/28/2013   Tetanus toxoids Other (See Comments) 03/23/2013   Tramadol  hcl  06/04/2021    Family History  Problem Relation Age of Onset   Bronchitis Mother     Liver cancer Father    Diabetes Sister     Social History   Socioeconomic History   Marital status: Married    Spouse name: Not on file   Number of children: Not on file   Years of education: Not on file   Highest education level: Not on file  Occupational History   Not on file  Tobacco Use   Smoking status: Never   Smokeless tobacco: Never  Substance and Sexual Activity   Alcohol  use: Yes    Alcohol /week: 3.0 standard drinks of alcohol     Types: 3  Glasses of wine per week    Comment: socially   Drug use: No   Sexual activity: Yes    Birth control/protection: Post-menopausal  Other Topics Concern   Not on file  Social History Narrative   Not on file   Social Drivers of Health   Financial Resource Strain: Not on file  Food Insecurity: No Food Insecurity (02/16/2023)   Hunger Vital Sign    Worried About Running Out of Food in the Last Year: Never true    Ran Out of Food in the Last Year: Never true  Transportation Needs: No Transportation Needs (02/16/2023)   PRAPARE - Administrator, Civil Service (Medical): No    Lack of Transportation (Non-Medical): No  Physical Activity: Not on file  Stress: Not on file  Social Connections: Not on file  Intimate Partner Violence: Not At Risk (02/16/2023)   Humiliation, Afraid, Rape, and Kick questionnaire    Fear of Current or Ex-Partner: No    Emotionally Abused: No    Physically Abused: No    Sexually Abused: No    Review of Systems: As per HPI, all others negative  Physical Exam: Vital signs in last 24 hours: Temp:  [97.1 F (36.2 C)-98.6 F (37 C)] 97.6 F (36.4 C) (01/08 1116) Pulse Rate:  [40-144] 97 (01/08 1224) Resp:  [13-32] 21 (01/08 1224) BP: (88-125)/(45-93) 109/54 (01/08 1224) SpO2:  [88 %-100 %] 94 % (01/08 1224) Weight:  [71.3 kg-72 kg] 71.3 kg (01/08 0500) Last BM Date :  (PTA) General:   Alert,  weak/deconditioned, pleasant and cooperative in NAD Head:  Normocephalic and atraumatic. Eyes:   Sclera clear, no icterus.   Conjunctiva pale Ears:  Normal auditory acuity. Nose:  No deformity, discharge,  or lesions. Mouth:  No deformity or lesions.  Oropharynx pale and dry Neck:  Supple; no masses or thyromegaly. Lungs:  No respiratory distress Abdomen:  Soft, nontender and nondistended. No masses, hepatosplenomegaly or hernias noted. Without guarding, and without rebound.     Msk:  Symmetrical without gross deformities. Normal posture. Pulses:  Normal pulses noted. Extremities:  Without clubbing or edema. Neurologic:  Alert and  oriented x4;  grossly normal neurologically. Skin:  Intact without significant lesions or rashes. Psych:  Alert and cooperative. Normal mood and affect.   Lab Results: Recent Labs    02/15/23 2138 02/15/23 2153 02/15/23 2336 02/16/23 0826  WBC 24.3*  --   --  20.4*  HGB 6.7* 6.1*  6.5* 6.6* 7.8*  HCT 20.4* 18.0*  19.0* 20.3* 22.0*  PLT 135*  --   --  111*   BMET Recent Labs    02/15/23 2138 02/15/23 2153 02/16/23 0826  NA 134* 135  135 137  K 3.9 4.0  4.0 3.3*  CL 105 105 106  CO2 15*  --  24  GLUCOSE 328* 321* 184*  BUN 51* 48* 35*  CREATININE 0.87 0.50 0.54  CALCIUM  9.0  --  8.7*   LFT Recent Labs    02/15/23 2138  PROT 5.0*  ALBUMIN 2.8*  AST 16  ALT 11  ALKPHOS 37*  BILITOT 0.7   PT/INR Recent Labs    02/15/23 2351  LABPROT 25.0*  INR 2.2*    Studies/Results: CT Head Wo Contrast Result Date: 02/15/2023 CLINICAL DATA:  Mental status change, unknown cause EXAM: CT HEAD WITHOUT CONTRAST TECHNIQUE: Contiguous axial images were obtained from the base of the skull through the vertex without intravenous contrast. RADIATION DOSE REDUCTION: This  exam was performed according to the departmental dose-optimization program which includes automated exposure control, adjustment of the mA and/or kV according to patient size and/or use of iterative reconstruction technique. COMPARISON:  None Available. FINDINGS: Brain: There is  atrophy and chronic small vessel disease changes. No acute intracranial abnormality. Specifically, no hemorrhage, hydrocephalus, mass lesion, acute infarction, or significant intracranial injury. Vascular: No hyperdense vessel or unexpected calcification. Skull: No acute calvarial abnormality. Sinuses/Orbits: No acute findings Other: None IMPRESSION: Atrophy, chronic microvascular disease. No acute intracranial abnormality. Electronically Signed   By: Franky Crease M.D.   On: 02/15/2023 22:34   DG Chest Portable 1 View Result Date: 02/15/2023 CLINICAL DATA:  Weakness, atrial fibrillation. Kidney along with your brother you think EXAM: PORTABLE CHEST 1 VIEW COMPARISON:  None Available. FINDINGS: The heart size and mediastinal contours are within normal limits. Both lungs are clear. The visualized skeletal structures are unremarkable. IMPRESSION: No active disease. Electronically Signed   By: Greig Pique M.D.   On: 02/15/2023 21:50    Impression:   Melena and acute blood loss anemia.  Suspect post-polypectomy bleeding. Chronic anticoagulation. Atrial fibrillation.  Plan:   Treat afib, rate control. Hold anticoagulation. CBC, transfuse as needed. If ongoing worsening anemia/bleeding, would need consider repeat colonoscopy. PPI. Liquid diet ok. [facility] will follow.   LOS: 1 day   Alexiya Franqui M  02/16/2023, 12:36 PM  Cell [phone] If no answer or after 5 PM call [phone]

## 2023-02-16 NOTE — Plan of Care (Signed)

## 2023-02-16 NOTE — Progress Notes (Addendum)
 eLink Physician-Brief Progress Note Patient Name: Erika [name] DOB: 02/03/1956 MRN: [id number]   Date of Service  02/16/2023  HPI/Events of Note  68 yo female presented with confusion that has been progressively worsening since Sunday.  She is found to have acute hemorrhagic anemia secondary to likely due to acute GI bleed.  She is tachypneic, tachycardic, mildly hypotensive requiring amiodarone  infusion for A-fib.  Results consistent with hyperglycemia and resolving kidney injury.  Lactic acid stable at 5.3.  Hemoglobin 6.6 INR elevated at 2.2.  FOBT positive.  2 units PRBC pending.  CT head unremarkable.  Chest radiograph unremarkable.  eICU Interventions  PPI IV twice daily  Maintain amiodarone  infusion  Transfuse for hemoglobin less than 7, transfuse 2 units FFP  DVT prophylaxis with SCDs GI prophylaxis with therapeutic pantoprazole    0238 -updated lactic acid ordered to be lab collect rather than POCT  Intervention Category Evaluation Type: New Patient Evaluation  [name] 02/16/2023, 12:54 AM

## 2023-02-16 NOTE — Inpatient Diabetes Management (Signed)
 Inpatient Diabetes Program Recommendations  AACE/ADA: New Consensus Statement on Inpatient Glycemic Control (2015)  Target Ranges:  Prepandial:   less than 140 mg/dL      Peak postprandial:   less than 180 mg/dL (1-2 hours)      Critically ill patients:  140 - 180 mg/dL   Lab Results  Component Value Date   GLUCAP 148 (H) 02/16/2023    Review of Glycemic Control  Latest Reference Range & Units 02/15/23 21:39 02/16/23 00:35 02/16/23 04:18 02/16/23 08:12 02/16/23 11:14  Glucose-Capillary 70 - 99 mg/dL 677 (H) 706 (H) 737 (H) 184 (H) 148 (H)   Diabetes history: DM  Outpatient Diabetes medications:  Trulicity  3 mg weekly Toujeo  40 units daily Metformin  1000 mg bid Current orders for Inpatient glycemic control:  Novolog  0-9 units q 4 hours  Inpatient Diabetes Program Recommendations:    CBG's improved.  If >goal, consider adding low dose basal insulin  such as Semglee  12 units daily.   Thanks,  Randall Bullocks, RN, BC-ADM Inpatient Diabetes Coordinator Pager [phone]  (8a-5p)

## 2023-02-17 DIAGNOSIS — K922 Gastrointestinal hemorrhage, unspecified: Secondary | ICD-10-CM | POA: Diagnosis not present

## 2023-02-17 DIAGNOSIS — D696 Thrombocytopenia, unspecified: Secondary | ICD-10-CM

## 2023-02-17 LAB — BASIC METABOLIC PANEL
Anion gap: 10 (ref 5–15)
BUN: 13 mg/dL (ref 8–23)
CO2: 24 mmol/L (ref 22–32)
Calcium: 8.3 mg/dL — ABNORMAL LOW (ref 8.9–10.3)
Chloride: 103 mmol/L (ref 98–111)
Creatinine, Ser: 0.6 mg/dL (ref 0.44–1.00)
GFR, Estimated: >60 mL/min (ref >60)
Glucose, Bld: 270 mg/dL — ABNORMAL HIGH (ref 70–99)
Potassium: 3.3 mmol/L — ABNORMAL LOW (ref 3.5–5.1)
Sodium: 137 mmol/L (ref 135–145)

## 2023-02-17 LAB — BPAM FFP
Blood Product Expiration Date: 202501122359
Blood Product Expiration Date: 202501122359
ISSUE DATE / TIME: 202501080316
ISSUE DATE / TIME: 202501080316
Unit Type and Rh: 6200
Unit Type and Rh: 6200

## 2023-02-17 LAB — TYPE AND SCREEN
ABO/RH(D): O NEG
Antibody Screen: NEGATIVE
Unit division: 0
Unit division: 0

## 2023-02-17 LAB — BPAM RBC
Blood Product Expiration Date: 202501292359
ISSUE DATE / TIME: 202501072330
ISSUE DATE / TIME: 202501080127
ISSUE DATE / TIME: 202502012359
Unit Type and Rh: 202501292359
Unit Type and Rh: 202502012359
Unit Type and Rh: 9500
Unit Type and Rh: 9500

## 2023-02-17 LAB — PREPARE FRESH FROZEN PLASMA

## 2023-02-17 LAB — CBC
HCT: 23.5 % — ABNORMAL LOW (ref 36.0–46.0)
Hemoglobin: 8.2 g/dL — ABNORMAL LOW (ref 12.0–15.0)
MCH: 32.3 pg (ref 26.0–34.0)
MCHC: 34.9 g/dL (ref 30.0–36.0)
MCV: 92.5 fL (ref 80.0–100.0)
Platelets: 117 10*3/uL — ABNORMAL LOW (ref 150–400)
RBC: 2.54 MIL/uL — ABNORMAL LOW (ref 3.87–5.11)
RDW: 14.7 % (ref 11.5–15.5)
WBC: 17.3 10*3/uL — ABNORMAL HIGH (ref 4.0–10.5)
nRBC: 0.6 % — ABNORMAL HIGH (ref 0.0–0.2)

## 2023-02-17 LAB — GLUCOSE, CAPILLARY
Glucose-Capillary: 97 mg/dL (ref 70–99)
Glucose-Capillary: 122 mg/dL — ABNORMAL HIGH (ref 70–99)
Glucose-Capillary: 330 mg/dL — ABNORMAL HIGH (ref 70–99)
Glucose-Capillary: 247 mg/dL — ABNORMAL HIGH (ref 70–99)
Glucose-Capillary: 191 mg/dL — ABNORMAL HIGH (ref 70–99)

## 2023-02-17 LAB — HEMOGLOBIN A1C
Hgb A1c MFr Bld: 8.3 % — ABNORMAL HIGH (ref 4.8–5.6)
Mean Plasma Glucose: 192 mg/dL

## 2023-02-17 LAB — MAGNESIUM: Magnesium: 1.5 mg/dL — ABNORMAL LOW (ref 1.7–2.4)

## 2023-02-17 MED ORDER — DILTIAZEM HCL 30 MG PO TABS
30.0000 mg | ORAL_TABLET | Freq: Four times a day (QID) | ORAL | Status: DC
  Administered 2023-02-17 – 2023-02-18 (×6): 30 mg via ORAL
  Filled 2023-02-17 (×8): qty 1

## 2023-02-17 MED ORDER — MAGNESIUM SULFATE 4 GM/100ML IV SOLN
4.0000 g | Freq: Once | INTRAVENOUS | Status: AC
  Administered 2023-02-17: 4 g via INTRAVENOUS
  Filled 2023-02-17: qty 100

## 2023-02-17 MED ORDER — INSULIN GLARGINE-YFGN 100 UNIT/ML ~~LOC~~ SOLN
20.0000 [IU] | Freq: Every day | SUBCUTANEOUS | Status: DC
  Administered 2023-02-17 – 2023-02-18 (×2): 20 [IU] via SUBCUTANEOUS
  Filled 2023-02-17 (×2): qty 0.2

## 2023-02-17 MED ORDER — POTASSIUM CHLORIDE CRYS ER 20 MEQ PO TBCR
40.0000 meq | EXTENDED_RELEASE_TABLET | Freq: Once | ORAL | Status: AC
  Administered 2023-02-17: 40 meq via ORAL
  Filled 2023-02-17: qty 2

## 2023-02-17 MED ORDER — INSULIN ASPART 100 UNIT/ML IJ SOLN
0.0000 [IU] | Freq: Three times a day (TID) | INTRAMUSCULAR | Status: DC
  Administered 2023-02-17: 11 [IU] via SUBCUTANEOUS
  Administered 2023-02-17: 3 [IU] via SUBCUTANEOUS
  Administered 2023-02-18 (×2): 15 [IU] via SUBCUTANEOUS
  Administered 2023-02-19: 3 [IU] via SUBCUTANEOUS
  Administered 2023-02-19: 2 [IU] via SUBCUTANEOUS
  Administered 2023-02-19: 3 [IU] via SUBCUTANEOUS
  Administered 2023-02-20: 2 [IU] via SUBCUTANEOUS
  Administered 2023-02-20: 3 [IU] via SUBCUTANEOUS

## 2023-02-17 MED ORDER — PANTOPRAZOLE SODIUM 40 MG PO TBEC
40.0000 mg | DELAYED_RELEASE_TABLET | Freq: Every day | ORAL | Status: DC
  Administered 2023-02-18 – 2023-02-21 (×4): 40 mg via ORAL
  Filled 2023-02-17 (×4): qty 1

## 2023-02-17 NOTE — Progress Notes (Signed)
   Progress Note   Date: 02/17/2023  Patient Name: Erika [name]        MRN#: [id number]  Eliquis contributed to GI bleed

## 2023-02-17 NOTE — Progress Notes (Addendum)
 NAME:  Erika [name], MRN:  [id number], DOB:  12/04/1955, LOS: 2 ADMISSION DATE:  02/15/2023, CONSULTATION DATE:  02/15/23 REFERRING MD:  EDP, CHIEF COMPLAINT:  acute GIB and encephalopathy   History of Present Illness:  67 yo female presented with confusion that has been progressively worsening since Sunday. Pt reportedly had colonoscopy and endoscopy on 1/3 and was home tired on 1/4. This did not improve and 1/5 pt worsened with weakness and overall less energy. Pt's daughter provides history as pt is altered at this time. She endorses that pt was becoming increasingly sob with walking. Denied c/o chest pain, fever/chills/cough/n/v/d but was noted to have darkening stools after her endoscopy.   Daughter states she was planning to transport pt to the ED but pt was unable to mobilize and thus EMS was contacted. Upon arrival she was noted to be in afib with RVR as well as hypotensive. She was given 1L Ivf. Hgb noted to be 6.1 from baseline of 13-14 (as of 9/24).  Pertinent  Medical History  Anxiety Hyperlipidemia Dm2 with hyperglycemia Chronic leukocytosis PAF on chronic eliquis Dysphagia with esophageal stricture Diabetic neuropathy Seasonal allergies  Significant Hospital Events: Including procedures, antibiotic start and stop dates in addition to other pertinent events   Admitted to ICU 1/7- Hgb 6.5 with dark tarry stools, mildly hypotensive with Afib w RVR 1/8 GI evaluated, concern for post-polypectomy bleed. Trend CBC  Interim History / Subjective:  MAP to 61 with sleeping. Heart rates controlled in 90-100s. Glucose 97s.   No additional episodes of dark stool overnight. She has been tolerating liquids ok, but is worried about if she will have diarrhea once she starts solid food. She has had problems with that at home.   Objective   Blood pressure 101/62, pulse (!) 117, temperature 97.9 F (36.6 C), temperature source Oral, resp. rate (!) 23, height 5' 4 (1.626 m), weight 71.3 kg,  SpO2 92%.        Intake/Output Summary (Last 24 hours) at 02/17/2023 9371 Last data filed at 02/16/2023 1900 Gross per 24 hour  Intake 1788.7 ml  Output --  Net 1788.7 ml   Filed Weights   02/15/23 2135 02/16/23 0037 02/16/23 0500  Weight: 72 kg 71.3 kg 71.3 kg   Examination: General: pale, female in nad laying supine in bed HENT: ncat, eomi, perrla, mm pale and dry Lungs: ctab Cardiovascular: irreg irreg, no murmurs Abdomen: soft, nt,nd BS ++ Extremities: no c/c/e Neuro: alert and oriented x 3  H/H 8.2  Mg 1.5 K 3.3  Resolved Hospital Problem list   Acute metabolic encephalopathy Lactic acidosis AGMA NAGMA  Assessment & Plan:   Melena and acute blood loss anemia Recent endoscopic procedure for esophageal stricture, 01/03 Thrombocytopenia Concern for post-polypectomy bleeding. S/p 2 units RBCs and 2 units FFP. No additional episodes of melena. Stable for transfer to med-tele.  -f/u on GI recommendations -liquid diet, likely advance pending GI note -PPI BID IV -hold eliquis, last dose 01/07 PM  Afib with rvr- resolved Heart rates 100-130s. Blood pressures stable. She is on diltiazem  120 mg ERevery day at home. RVR in setting of acute blood loss anemia.  - stop amiodarone  - restart diltiazem  30 mg q 6 hrs  Diabetes Hyperglycemia with ketonuria Glucose 97. Last endo note lists home meds with toujeo  22 units every day, trulicity . Not on SGLT2. UA did have ketones with hyperglycemia.  -check hgb A1c, ssi otherwise q4 as pt will be npo  Best Practice (right click  and Reselect all SmartList Selections daily)   Diet/type: liquid diet DVT prophylaxis SCD Pressure ulcer(s): see flowsheet assessment GI prophylaxis: PPI BID Lines: N/A Foley:  N/A Code Status:  full code Last date of multidisciplinary goals of care discussion [patient and daughter updated at bedside]  Izetta HERO. Veena Sturgess, D.O.  Internal Medicine Resident, PGY-3 [facility]   Pager: [phone] 6:28 AM, 02/17/2023

## 2023-02-17 NOTE — Evaluation (Signed)
 Physical Therapy Evaluation Patient Details Name: Erika [name] MRN: [id number] DOB: 27-Jul-1955 Today's Date: 02/17/2023  History of Present Illness  Pt is a 68 y.o. female who presented 02/15/23 with progressive weakness and AMS. Pt reportedly had colonoscopy and endoscopy on 1/3 and was home tired on 1/4. Upon arrival, pt found to be in afib with RVR and hypotensive. Admitted with anemia, tachycardia, atrial fibrillation, melena. Suspect post-polypectomy bleeding. PMH: anemia, dysphagia, hx of colon polyps, HLD, HTN, insomnia, neuromuscular disorder, neuropathy, DM2   Clinical Impression  Pt presents with condition above and deficits mentioned below, see PT Problem List. PTA, she was independent without DME, living with her husband and daughter in a 1-level house with a  level entry. Currently, pt is limited by her orthostatic hypotension, see below. This is likely contributing to her noted deficits in balance, processing speed, and memory while standing along with her subjective deficits in strength. She needed CGA-minA to ambulate without UE support this date. She will likely progress well once her BP improves and she mobilizes more frequently, but will recommend follow-up with OPPT to ensure pt progresses back to her baseline per pt request. Will continue to follow acutely.   Vitals -  103/52 (65) & 101 bpm supine 103/62 (75) & 113 bpm sitting 88/69 (78) & 113 bpm standing (pt felt weak) 99/57 (72) standing ~3 min (pt felt weak, legs felt shaky) 113/55 (73) sitting after ambulating         If plan is discharge home, recommend the following: A little help with walking and/or transfers;Assistance with cooking/housework;Assist for transportation;Direct supervision/assist for financial management;Direct supervision/assist for medications management   Can travel by private vehicle        Equipment Recommendations  (likely none, will continue to assess)  Recommendations for Other Services        Functional Status Assessment Patient has had a recent decline in their functional status and demonstrates the ability to make significant improvements in function in a reasonable and predictable amount of time.     Precautions / Restrictions Precautions Precautions: Fall Precaution Comments: watch BP (orthostatic 1/9) Restrictions Weight Bearing Restrictions Per Provider Order: No      Mobility  Bed Mobility Overal bed mobility: Needs Assistance Bed Mobility: Supine to Sit, Sit to Supine     Supine to sit: Supervision, HOB elevated Sit to supine: Supervision, HOB elevated   General bed mobility comments: Supervision for safety, HOB elevated    Transfers Overall transfer level: Needs assistance Equipment used: None Transfers: Sit to/from Stand Sit to Stand: Contact guard assist           General transfer comment: Slow to power up to stand with pt leaning back against bed initially for balance, CGA for safety    Ambulation/Gait Ambulation/Gait assistance: Contact guard assist, Min assist Gait Distance (Feet): 50 Feet Assistive device: None Gait Pattern/deviations: Step-through pattern, Decreased stride length Gait velocity: reduced Gait velocity interpretation: 1.31 - 2.62 ft/sec, indicative of limited community ambulator   General Gait Details: Pt ambulated laps in the room for her safety this date due to her BP dropping. Pt able to ambulate forward with CGA and no LOB but displayed instability when stepping posteriorly, needing up to minA for balance.  Stairs            Wheelchair Mobility     Tilt Bed    Modified Rankin (Stroke Patients Only)       Balance Overall balance assessment: Mild deficits observed, not  formally tested                                           Pertinent Vitals/Pain Pain Assessment Pain Assessment: Faces Faces Pain Scale: No hurt Pain Intervention(s): Monitored during session    Home Living  Family/patient expects to be discharged to:: Private residence Living Arrangements: Spouse/significant other;Children (daughter) Available Help at Discharge: Family;Available 24 hours/day Type of Home: House Home Access: Level entry       Home Layout: One level Home Equipment: Shower seat      Prior Function Prior Level of Function : Independent/Modified Independent;Driving             Mobility Comments: No AD       Extremity/Trunk Assessment   Upper Extremity Assessment Upper Extremity Assessment: Overall WFL for tasks assessed    Lower Extremity Assessment Lower Extremity Assessment: Overall WFL for tasks assessed    Cervical / Trunk Assessment Cervical / Trunk Assessment: Normal  Communication   Communication Communication: No apparent difficulties  Cognition Arousal: Alert Behavior During Therapy: WFL for tasks assessed/performed Overall Cognitive Status: Impaired/Different from baseline Area of Impairment: Problem solving, Memory                     Memory: Decreased short-term memory       Problem Solving: Difficulty sequencing, Slow processing General Comments: Sister present and reporting pt was at her baseline cognitively initially during session, pt was supine at the time. Once pt was standing and ambulating, noted pt repeating herself and needing cues to sequence ambulating laps in room, which sister reported was not pt's baseline, BP noted to drop.        General Comments General comments (skin integrity, edema, etc.): Vitals - 103/52 (65) & 101 bpm supine, 103/62 (75) & 113 bpm sitting, 88/69 (78) & 113 bpm standing, 99/57 (72) standing ~3 min, 113/55 (73) sitting after ambulating; encouraged pt to get OOB with nursing staff for meals    Exercises     Assessment/Plan    PT Assessment Patient needs continued PT services  PT Problem List Decreased strength;Decreased activity tolerance;Decreased balance;Decreased mobility;Decreased  cognition;Cardiopulmonary status limiting activity       PT Treatment Interventions DME instruction;Gait training;Functional mobility training;Therapeutic activities;Therapeutic exercise;Balance training;Neuromuscular re-education;Cognitive remediation;Patient/family education    PT Goals (Current goals can be found in the Care Plan section)  Acute Rehab PT Goals Patient Stated Goal: to improve and go home PT Goal Formulation: With patient/family Time For Goal Achievement: 03/03/23 Potential to Achieve Goals: Good    Frequency Min 1X/week     Co-evaluation               AM-PAC PT 6 Clicks Mobility  Outcome Measure Help needed turning from your back to your side while in a flat bed without using bedrails?: A Little Help needed moving from lying on your back to sitting on the side of a flat bed without using bedrails?: A Little Help needed moving to and from a bed to a chair (including a wheelchair)?: A Little Help needed standing up from a chair using your arms (e.g., wheelchair or bedside chair)?: A Little Help needed to walk in hospital room?: A Little Help needed climbing 3-5 steps with a railing? : A Little 6 Click Score: 18    End of Session Equipment Utilized During Treatment: Gait belt  Activity Tolerance: Patient tolerated treatment well;Other (comment) (limited by drop in BP) Patient left: in bed;with call bell/phone within reach;with bed alarm set;with family/visitor present Nurse Communication: Mobility status;Other (comment) (BP) PT Visit Diagnosis: Unsteadiness on feet (R26.81);Other abnormalities of gait and mobility (R26.89);Muscle weakness (generalized) (M62.81);Difficulty in walking, not elsewhere classified (R26.2)    Time: 8489-8466 PT Time Calculation (min) (ACUTE ONLY): 23 min   Charges:   PT Evaluation $PT Eval Low Complexity: 1 Low PT Treatments $Therapeutic Activity: 8-22 mins PT General Charges $$ ACUTE PT VISIT: 1 Visit          Theo Ferretti, PT, DPT Acute Rehabilitation Services  Office: [phone]   Theo CHRISTELLA Ferretti 02/17/2023, 5:59 PM

## 2023-02-17 NOTE — Progress Notes (Signed)
 Subjective: No further melena. No abdominal pain. Tolerating liquids.  Objective: Vital signs in last 24 hours: Temp:  [97.6 F (36.4 C)-98.5 F (36.9 C)] 98 F (36.7 C) (01/09 0719) Pulse Rate:  [94-118] 117 (01/09 0600) Resp:  [11-27] 23 (01/09 0600) BP: (86-134)/(49-112) 101/62 (01/09 0600) SpO2:  [91 %-99 %] 92 % (01/09 0600) Weight change:  Last BM Date :  (PTA)  PE: GEN:  NAD SKIN:  Pale CV:  Tachy 110's RESP:  No visible distress ABD:  Soft, non-tender NEURO:  No encephalopathy  Lab Results: CBC    Component Value Date/Time   WBC 17.3 (H) 02/17/2023 0806   RBC 2.54 (L) 02/17/2023 0806   HGB 8.2 (L) 02/17/2023 0806   HGB 14.7 10/08/2022 1245   HCT 23.5 (L) 02/17/2023 0806   PLT 117 (L) 02/17/2023 0806   PLT 136 (L) 10/08/2022 1245   MCV 92.5 02/17/2023 0806   MCH 32.3 02/17/2023 0806   MCHC 34.9 02/17/2023 0806   RDW 14.7 02/17/2023 0806   LYMPHSABS 1.9 02/15/2023 2138   MONOABS 1.3 (H) 02/15/2023 2138   EOSABS 0.0 02/15/2023 2138   BASOSABS 0.0 02/15/2023 2138  CMP     Component Value Date/Time   NA 137 02/17/2023 0806   K 3.3 (L) 02/17/2023 0806   CL 103 02/17/2023 0806   CO2 24 02/17/2023 0806   GLUCOSE 270 (H) 02/17/2023 0806   BUN 13 02/17/2023 0806   CREATININE 0.60 02/17/2023 0806   CREATININE 0.56 07/09/2022 1211   CALCIUM  8.3 (L) 02/17/2023 0806   PROT 5.0 (L) 02/15/2023 2138   ALBUMIN 2.8 (L) 02/15/2023 2138   AST 16 02/15/2023 2138   AST 8 (L) 07/09/2022 1211   ALT 11 02/15/2023 2138   ALT 10 07/09/2022 1211   ALKPHOS 37 (L) 02/15/2023 2138   BILITOT 0.7 02/15/2023 2138   BILITOT 0.5 07/09/2022 1211   GFRNONAA >60 02/17/2023 0806   GFRNONAA >60 07/09/2022 1211   Assessment:    Melena, resolved, suspect post-polypectomy bleeding. Rapid atrial fibrillation. Acute blood loss anemia. Chronic anticoagulation, apixaban.  Plan:   Advance diet. PPI pantoprazole  40 mg po every day now and upon discharge. Hold Apixaban for 10 more  days before restarting. [facility] will sign-off; please call with questions; thank you for the consultation; she can follow-up with Dr. Saintclair upon hospital discharge.   BURNETTE ELSIE HERO 02/17/2023, 10:20 AM   Cell [phone] If no answer or after 5 PM call [phone]

## 2023-02-17 NOTE — Progress Notes (Signed)
 Patient received on the floor at 1810 PM via bed. Alert and oriented. Skin is intact. Connected to the cardiac monitor box 19. On RA. Will continue to monitor

## 2023-02-17 NOTE — Progress Notes (Addendum)
 Patient stable for transfer out of ICU to med tele.  [facility] (Dr. Rhona Leavens) to assume care 01/10 at 7AM.  Erika [name]. Erika [name], D.O.  Internal Medicine Resident, PGY-3 [facility]  Pager: [phone] 9:27 AM, 02/17/2023

## 2023-02-18 ENCOUNTER — Encounter (HOSPITAL_BASED_OUTPATIENT_CLINIC_OR_DEPARTMENT_OTHER): Payer: Self-pay | Admitting: Internal Medicine

## 2023-02-18 DIAGNOSIS — I4891 Unspecified atrial fibrillation: Secondary | ICD-10-CM

## 2023-02-18 DIAGNOSIS — R58 Hemorrhage, not elsewhere classified: Secondary | ICD-10-CM | POA: Diagnosis not present

## 2023-02-18 DIAGNOSIS — D649 Anemia, unspecified: Secondary | ICD-10-CM

## 2023-02-18 LAB — CBC
HCT: 25.1 % — ABNORMAL LOW (ref 36.0–46.0)
Hemoglobin: 8.4 g/dL — ABNORMAL LOW (ref 12.0–15.0)
MCH: 31.5 pg (ref 26.0–34.0)
MCHC: 33.5 g/dL (ref 30.0–36.0)
MCV: 94 fL (ref 80.0–100.0)
Platelets: 122 10*3/uL — ABNORMAL LOW (ref 150–400)
RBC: 2.67 MIL/uL — ABNORMAL LOW (ref 3.87–5.11)
RDW: 14.7 % (ref 11.5–15.5)
WBC: 18.8 10*3/uL — ABNORMAL HIGH (ref 4.0–10.5)
nRBC: 0.5 % — ABNORMAL HIGH (ref 0.0–0.2)

## 2023-02-18 LAB — MAGNESIUM: Magnesium: 1.8 mg/dL (ref 1.7–2.4)

## 2023-02-18 LAB — GLUCOSE, CAPILLARY
Glucose-Capillary: 407 mg/dL — ABNORMAL HIGH (ref 70–99)
Glucose-Capillary: 410 mg/dL — ABNORMAL HIGH (ref 70–99)
Glucose-Capillary: 358 mg/dL — ABNORMAL HIGH (ref 70–99)
Glucose-Capillary: 377 mg/dL — ABNORMAL HIGH (ref 70–99)
Glucose-Capillary: 120 mg/dL — ABNORMAL HIGH (ref 70–99)
Glucose-Capillary: 89 mg/dL (ref 70–99)

## 2023-02-18 LAB — BASIC METABOLIC PANEL
Anion gap: 8 (ref 5–15)
Anion gap: 8 (ref 5–15)
BUN: <5 mg/dL — ABNORMAL LOW (ref 8–23)
BUN: <5 mg/dL — ABNORMAL LOW (ref 8–23)
CO2: 26 mmol/L (ref 22–32)
CO2: 24 mmol/L (ref 22–32)
Calcium: 8.7 mg/dL — ABNORMAL LOW (ref 8.9–10.3)
Calcium: 8.6 mg/dL — ABNORMAL LOW (ref 8.9–10.3)
Chloride: 105 mmol/L (ref 98–111)
Chloride: 105 mmol/L (ref 98–111)
Creatinine, Ser: 0.47 mg/dL (ref 0.44–1.00)
Creatinine, Ser: 0.57 mg/dL (ref 0.44–1.00)
GFR, Estimated: >60 mL/min (ref >60)
GFR, Estimated: >60 mL/min (ref >60)
Glucose, Bld: 215 mg/dL — ABNORMAL HIGH (ref 70–99)
Glucose, Bld: 375 mg/dL — ABNORMAL HIGH (ref 70–99)
Potassium: 3.8 mmol/L (ref 3.5–5.1)
Potassium: 4.2 mmol/L (ref 3.5–5.1)
Sodium: 139 mmol/L (ref 135–145)
Sodium: 137 mmol/L (ref 135–145)

## 2023-02-18 MED ORDER — METFORMIN HCL ER 500 MG PO TB24
1000.0000 mg | ORAL_TABLET | Freq: Two times a day (BID) | ORAL | Status: DC
  Administered 2023-02-18 – 2023-02-21 (×6): 1000 mg via ORAL
  Filled 2023-02-18 (×6): qty 2

## 2023-02-18 MED ORDER — INSULIN GLARGINE-YFGN 100 UNIT/ML ~~LOC~~ SOLN
40.0000 [IU] | Freq: Every day | SUBCUTANEOUS | Status: DC
  Administered 2023-02-19 – 2023-02-21 (×3): 40 [IU] via SUBCUTANEOUS
  Filled 2023-02-18 (×3): qty 0.4

## 2023-02-18 MED ORDER — ESCITALOPRAM OXALATE 10 MG PO TABS
10.0000 mg | ORAL_TABLET | Freq: Every day | ORAL | Status: DC
  Administered 2023-02-18 – 2023-02-21 (×4): 10 mg via ORAL
  Filled 2023-02-18 (×4): qty 1

## 2023-02-18 MED ORDER — DEXTRAN 70-HYPROMELLOSE 0.1-0.3 % OP SOLN: 2.0000 [drp] | Freq: Every day | OPHTHALMIC | Status: DC | PRN

## 2023-02-18 MED ORDER — POLYVINYL ALCOHOL 1.4 % OP SOLN: 1.0000 [drp] | OPHTHALMIC | Status: DC | PRN

## 2023-02-18 MED ORDER — ROSUVASTATIN CALCIUM 5 MG PO TABS
5.0000 mg | ORAL_TABLET | Freq: Every day | ORAL | Status: DC
  Administered 2023-02-18 – 2023-02-21 (×4): 5 mg via ORAL
  Filled 2023-02-18 (×4): qty 1

## 2023-02-18 MED ORDER — INSULIN GLARGINE-YFGN 100 UNIT/ML ~~LOC~~ SOLN
20.0000 [IU] | Freq: Once | SUBCUTANEOUS | Status: AC
Start: 2023-02-18 — End: 2023-02-18
  Administered 2023-02-18: 20 [IU] via SUBCUTANEOUS
  Filled 2023-02-18: qty 0.2

## 2023-02-18 MED ORDER — ALPRAZOLAM 0.25 MG PO TABS: 0.2500 mg | ORAL_TABLET | Freq: Every evening | ORAL | Status: DC | PRN

## 2023-02-18 MED ORDER — DILTIAZEM HCL ER COATED BEADS 120 MG PO CP24
120.0000 mg | ORAL_CAPSULE | Freq: Every day | ORAL | Status: DC
  Administered 2023-02-19 – 2023-02-21 (×3): 120 mg via ORAL
  Filled 2023-02-18 (×3): qty 1

## 2023-02-18 MED ORDER — MONTELUKAST SODIUM 10 MG PO TABS
10.0000 mg | ORAL_TABLET | Freq: Every day | ORAL | Status: DC
  Administered 2023-02-18 – 2023-02-21 (×4): 10 mg via ORAL
  Filled 2023-02-18 (×4): qty 1

## 2023-02-18 NOTE — Progress Notes (Signed)
 [facility]                                                                               Erika [name], is a 68 y.o. female, DOB - 06-15-1955, FMW:[id number] Admit date - 02/15/2023    Outpatient Primary MD for the patient is Cleotilde, [place]  E, PA  LOS - 3  days    Brief summary   68 yo female  with h/o Anxiety, Hyperlipidemia, Dm2 with hyperglycemia, Chronic leukocytosis, PAF on chronic eliquis, Dysphagia with esophageal stricture, Diabetic neuropathy , underwent colonoscopy and EGD on 1/3 and sent home on 1/4, admitted to ICU for anemia, hemoglobin of 6.1 with dark tarry stools, hypotension and atrial fib with rvr. GI consulted for possible post colectomy bleed. Eliquis is held. Her hemoglobin improved and remains stable. She was transferred to [facility] service on 1/10.   Assessment & Plan    Assessment and Plan:  Anemia of blood loss possibly  polypectomy bleed.  - s/p 2 units of prbc and 2 units of FFP.  - advance diet .  - will hold eliquis for 10 days.  Continue with PPI.   Atrial fibrillation with RVR  Resolved.  Consolidate diltiazem .   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  Restart Semglee  40 units daily.   H/o chronic leukocytosis:  Monitor.    Dysphagia with esophageal stricture S/p EGD with dilation.      Estimated body mass index is 28.12 kg/m as calculated from the following:   Height as of this encounter: 5' 4 (1.626 m).   Weight as of this encounter: 74.3 kg.  Code Status:  full code.  DVT Prophylaxis:  SCDs Start: 02/15/23 2319   Level of Care: Level of care: Telemetry Medical Family Communication: Updated patient's  daughter at bedside.   Disposition Plan:     Remains inpatient appropriate:  possible d/c in am.  Procedures:  None.  Consultants:   PCCM GASTROENTEROLOGY.   Antimicrobials:    Anti-infectives (From admission, onward)    None        Medications  Scheduled Meds:  Chlorhexidine  Gluconate Cloth  6 each Topical Daily   diltiazem   30 mg Oral Q6H   escitalopram   10 mg Oral Daily   feeding supplement  1 Container Oral TID BM   insulin  aspart  0-15 Units Subcutaneous TID WC   [START ON 02/19/2023] insulin  glargine-yfgn  40 Units Subcutaneous Daily   metFORMIN   1,000 mg Oral BID   montelukast   10 mg Oral Daily   pantoprazole   40 mg Oral Daily   rosuvastatin   5 mg Oral Daily   Continuous Infusions: PRN Meds:.ALPRAZolam , Dextran 70-Hypromellose, docusate sodium , mouth rinse, polyethylene glycol    Subjective:   Erika [name] was seen and examined today.  No new complaints.  Objective:   Vitals:   02/18/23 0505 02/18/23 0804 02/18/23 1328 02/18/23 1552  BP: 113/61 125/63 115/60 128/81  Pulse: 98 96 (!) 107 (!) 101  Resp: 20 18  18   Temp: 97.8 F (36.6  C) 98 F (36.7 C) 98.6 F (37 C) 98 F (36.7 C)  TempSrc: Oral  Oral   SpO2: 97% 100% (!) 85% 98%  Weight: 74.3 kg     Height:        Intake/Output Summary (Last 24 hours) at 02/18/2023 1617 Last data filed at 02/18/2023 1400 Gross per 24 hour  Intake 800 ml  Output --  Net 800 ml   Filed Weights   02/16/23 0037 02/16/23 0500 02/18/23 0505  Weight: 71.3 kg 71.3 kg 74.3 kg     Exam General exam: Appears calm and comfortable  Respiratory system: Clear to auscultation. Respiratory effort normal. Cardiovascular system: S1 & S2 heard,  irregularly irregular.  Gastrointestinal system: Abdomen is nondistended, soft and nontender.  Central nervous system: Alert and oriented. No focal neurological deficits. Extremities: Symmetric 5 x 5 power. Skin: No rashes,  Psychiatry:  Mood & affect appropriate.     Data Reviewed:  I have personally reviewed following labs and imaging studies   CBC Lab Results  Component Value Date   WBC 18.8 (H) 02/18/2023   RBC 2.67 (L) 02/18/2023   HGB 8.4 (L)  02/18/2023   HCT 25.1 (L) 02/18/2023   MCV 94.0 02/18/2023   MCH 31.5 02/18/2023   PLT 122 (L) 02/18/2023   MCHC 33.5 02/18/2023   RDW 14.7 02/18/2023   LYMPHSABS 1.9 02/15/2023   MONOABS 1.3 (H) 02/15/2023   EOSABS 0.0 02/15/2023   BASOSABS 0.0 02/15/2023     Last metabolic panel Lab Results  Component Value Date   NA 137 02/18/2023   K 4.2 02/18/2023   CL 105 02/18/2023   CO2 24 02/18/2023   BUN <5 (L) 02/18/2023   CREATININE 0.57 02/18/2023   GLUCOSE 375 (H) 02/18/2023   GFRNONAA >60 02/18/2023   GFRAA >90 01/29/2014   CALCIUM  8.6 (L) 02/18/2023   PROT 5.0 (L) 02/15/2023   ALBUMIN 2.8 (L) 02/15/2023   BILITOT 0.7 02/15/2023   ALKPHOS 37 (L) 02/15/2023   AST 16 02/15/2023   ALT 11 02/15/2023   ANIONGAP 8 02/18/2023    CBG (last 3)  Recent Labs    02/18/23 1047 02/18/23 1205 02/18/23 1551  GLUCAP 358* 377* 89      Coagulation Profile: Recent Labs  Lab 02/15/23 2351  INR 2.2*     Radiology Studies: No results found.     Elgie Butter M.D. [facility] 02/18/2023, 4:17 PM  Available via Epic secure chat 7am-7pm After 7 pm, please refer to night coverage provider listed on amion.

## 2023-02-18 NOTE — Care Management Important Message (Signed)
 Important Message  Patient Details  Name: Erika [name] MRN: [id number] Date of Birth: [birth year]   Important Message Given:  Yes - Medicare IM     Dorena Bodo 02/18/2023, 2:21 PM

## 2023-02-18 NOTE — Progress Notes (Signed)
 Transition of Care [facility]) - Inpatient Brief Assessment   Patient Details  Name: Erika [name] MRN: [id number] Date of Birth: 1955-03-17  [facility] CM/SW Contact:    Rosaline JONELLE Joe, RN Phone Number: 02/18/2023, 3:11 PM   Clinical Narrative: Patient admitted for Acute GI bleeding.  Patient lives with family and plans to return home when medically stable for discharge.  Patient has shower seat at home.  Patient was provided with choice regarding OUtpatient PT and she was agreeable to [facility] location.  OUtpatient referral placed in the hub to be co-signed by MD.  No other TOC needs.  Patient's family will provide transportation to home by car when stable.   Transition of Care Asessment: Insurance and Status: (P) Insurance coverage has been reviewed Patient has primary care physician: (P) Yes Home environment has been reviewed: (P) from home with spouse, daughter Prior level of function:: (P) Independent Prior/Current Home Services: (P) No current home services Social Drivers of Health Review: (P) SDOH reviewed interventions complete Readmission risk has been reviewed: (P) Yes Transition of care needs: (P) transition of care needs identified, TOC will continue to follow

## 2023-02-18 NOTE — Progress Notes (Signed)
 Physical Therapy Treatment Patient Details Name: Erika [name] MRN: [id number] DOB: 1955-12-29 Today's Date: 02/18/2023   History of Present Illness Pt is a 68 y.o. female who presented 02/15/23 with progressive weakness and AMS. Pt reportedly had colonoscopy and endoscopy on 1/3 and was home tired on 1/4. Upon arrival, pt found to be in afib with RVR and hypotensive. Admitted with anemia, tachycardia, atrial fibrillation, melena. Suspect post-polypectomy bleeding. PMH: anemia, dysphagia, hx of colon polyps, HLD, HTN, insomnia, neuromuscular disorder, neuropathy, DM2    PT Comments  Pt doing well from PT standpoint and ready for dc home when medically ready. Recommend OPPT for high level balance and strengthening. Will dc from acute PT.     If plan is discharge home, recommend the following: Assistance with cooking/housework;Assist for transportation   Can travel by private vehicle        Equipment Recommendations  None recommended by PT    Recommendations for Other Services       Precautions / Restrictions Precautions Precautions: None     Mobility  Bed Mobility               General bed mobility comments: Pt up in chair    Transfers Overall transfer level: Modified independent Equipment used: None Transfers: Sit to/from Stand Sit to Stand: Modified independent (Device/Increase time)                Ambulation/Gait Ambulation/Gait assistance: Supervision, Modified independent (Device/Increase time) Gait Distance (Feet): 300 Feet Assistive device: None Gait Pattern/deviations: Step-through pattern, Decreased step length - right, Decreased step length - left, Decreased stride length Gait velocity: decr Gait velocity interpretation: 1.31 - 2.62 ft/sec, indicative of limited community ambulator   General Gait Details: No loss of balance. slightly tentative.   Stairs             Wheelchair Mobility     Tilt Bed    Modified Rankin (Stroke Patients  Only)       Balance Overall balance assessment: Mild deficits observed, not formally tested                           High level balance activites: Backward walking, Side stepping High Level Balance Comments: UE support or CGA for extended backwards walking            Cognition Arousal: Alert Behavior During Therapy: WFL for tasks assessed/performed Overall Cognitive Status: Within Functional Limits for tasks assessed                                          Exercises Other Exercises Other Exercises: Repeat sit to stand without use of hands x 5    General Comments        Pertinent Vitals/Pain Pain Assessment Pain Assessment: No/denies pain    Home Living                          Prior Function            PT Goals (current goals can now be found in the care plan section) Progress towards PT goals: Goals met/education completed, patient discharged from PT    Frequency           PT Plan      Co-evaluation  AM-PAC PT 6 Clicks Mobility   Outcome Measure  Help needed turning from your back to your side while in a flat bed without using bedrails?: None Help needed moving from lying on your back to sitting on the side of a flat bed without using bedrails?: None Help needed moving to and from a bed to a chair (including a wheelchair)?: None Help needed standing up from a chair using your arms (e.g., wheelchair or bedside chair)?: None Help needed to walk in hospital room?: None Help needed climbing 3-5 steps with a railing? : A Little 6 Click Score: 23    End of Session   Activity Tolerance: Patient tolerated treatment well Patient left: in chair;with call bell/phone within reach;with family/visitor present   PT Visit Diagnosis: Other abnormalities of gait and mobility (R26.89);Muscle weakness (generalized) (M62.81)     Time: 1045-1101 PT Time Calculation (min) (ACUTE ONLY): 16  min  Charges:    $Gait Training: 8-22 mins PT General Charges $$ ACUTE PT VISIT: 1 Visit                     [facility] PT Acute Rehabilitation Services Office [phone]    Rodgers ORN [facility] 02/18/2023, 12:10 PM

## 2023-02-19 DIAGNOSIS — R58 Hemorrhage, not elsewhere classified: Secondary | ICD-10-CM

## 2023-02-19 LAB — CBC WITH DIFFERENTIAL/PLATELET
Abs Immature Granulocytes: 0.31 10*3/uL — ABNORMAL HIGH (ref 0.00–0.07)
Basophils Absolute: 0.1 10*3/uL (ref 0.0–0.1)
Basophils Relative: 0 %
Eosinophils Absolute: 0.2 10*3/uL (ref 0.0–0.5)
Eosinophils Relative: 1 %
HCT: 24.8 % — ABNORMAL LOW (ref 36.0–46.0)
Hemoglobin: 8.2 g/dL — ABNORMAL LOW (ref 12.0–15.0)
Immature Granulocytes: 2 %
Lymphocytes Relative: 14 %
Lymphs Abs: 2.7 10*3/uL (ref 0.7–4.0)
MCH: 31.4 pg (ref 26.0–34.0)
MCHC: 33.1 g/dL (ref 30.0–36.0)
MCV: 95 fL (ref 80.0–100.0)
Monocytes Absolute: 1.5 10*3/uL — ABNORMAL HIGH (ref 0.1–1.0)
Monocytes Relative: 8 %
Neutro Abs: 14.2 10*3/uL — ABNORMAL HIGH (ref 1.7–7.7)
Neutrophils Relative %: 75 %
Platelets: 100 10*3/uL — ABNORMAL LOW (ref 150–400)
RBC: 2.61 MIL/uL — ABNORMAL LOW (ref 3.87–5.11)
RDW: 15.1 % (ref 11.5–15.5)
WBC: 19 10*3/uL — ABNORMAL HIGH (ref 4.0–10.5)
nRBC: 0.4 % — ABNORMAL HIGH (ref 0.0–0.2)

## 2023-02-19 LAB — BASIC METABOLIC PANEL
Anion gap: 8 (ref 5–15)
BUN: 6 mg/dL — ABNORMAL LOW (ref 8–23)
CO2: 25 mmol/L (ref 22–32)
Calcium: 8.8 mg/dL — ABNORMAL LOW (ref 8.9–10.3)
Chloride: 107 mmol/L (ref 98–111)
Creatinine, Ser: 0.64 mg/dL (ref 0.44–1.00)
GFR, Estimated: >60 mL/min (ref >60)
Glucose, Bld: 141 mg/dL — ABNORMAL HIGH (ref 70–99)
Potassium: 4.1 mmol/L (ref 3.5–5.1)
Sodium: 140 mmol/L (ref 135–145)

## 2023-02-19 LAB — GLUCOSE, CAPILLARY
Glucose-Capillary: 132 mg/dL — ABNORMAL HIGH (ref 70–99)
Glucose-Capillary: 168 mg/dL — ABNORMAL HIGH (ref 70–99)
Glucose-Capillary: 196 mg/dL — ABNORMAL HIGH (ref 70–99)
Glucose-Capillary: 117 mg/dL — ABNORMAL HIGH (ref 70–99)

## 2023-02-19 MED ORDER — SENNA 8.6 MG PO TABS
1.0000 | ORAL_TABLET | Freq: Two times a day (BID) | ORAL | Status: DC
  Administered 2023-02-19 – 2023-02-20 (×2): 8.6 mg via ORAL
  Filled 2023-02-19 (×3): qty 1

## 2023-02-19 NOTE — Plan of Care (Signed)
  Problem: Coping: Goal: Ability to adjust to condition or change in health will improve Outcome: Progressing   Problem: Fluid Volume: Goal: Ability to maintain a balanced intake and output will improve Outcome: Progressing   Problem: Health Behavior/Discharge Planning: Goal: Ability to manage health-related needs will improve Outcome: Progressing   Problem: Metabolic: Goal: Ability to maintain appropriate glucose levels will improve Outcome: Progressing   Problem: Nutritional: Goal: Maintenance of adequate nutrition will improve Outcome: Progressing   Problem: Nutrition: Goal: Adequate nutrition will be maintained Outcome: Progressing   Problem: Elimination: Goal: Will not experience complications related to bowel motility Outcome: Progressing   Problem: Pain Management: Goal: General experience of comfort will improve Outcome: Progressing

## 2023-02-19 NOTE — Progress Notes (Signed)
 PROGRESS NOTE    Erika [name]  FMW:[id number] DOB: [birth year] DOA: 02/15/2023 PCP: Cleotilde, [place]  E, PA   Brief Narrative:  This 68 yrs old female with PMH significant for Anxiety, Hyperlipidemia, DM2 with hyperglycemia, Chronic leukocytosis, PAF on chronic eliquis, Dysphagia with esophageal stricture, Diabetic neuropathy , underwent colonoscopy and EGD on 02/11/23 and sent home on 02/12/23, Patient returned and admitted to ICU for anemia, hemoglobin of 6.1 with dark tarry stools, hypotension and atrial fib with RVR. GI consulted for possible post colectomy bleed. Eliquis is held. Her hemoglobin improved and remains stable. She was transferred to [facility] service on 1/10.  H&H remains stable.  Assessment & Plan:   Principal Problem:   Acute bleeding  Acute blood loss anemia: Possibly post polypectomy bleed; s/p 2 units of prbc and 2 units of FFP.  Bleeding has stopped. H/H remains stable. Advised diet Hold  eliquis for 10 days.  Continue with PPI.    Atrial fibrillation with RVR  Resolved.  Consolidate diltiazem .  Hold Eliquis for 10 days in the setting of GI bleed.   DM II with hyperglycemia: Continue SSI.  Continue Semglee  40 units daily.    H/o chronic leukocytosis:  Monitor CBC   Dysphagia with esophageal stricture: S/p EGD with dilation.      Overweight: Estimated body mass index is 28.12 kg/m as calculated from the following:   Height as of this encounter: 5' 4 (1.626 m).   Weight as of this encounter: 74.3 kg.   DVT prophylaxis: SCDs Code Status:Full code Family Communication: Daughter at bed side. Disposition Plan:  Status is: Inpatient Remains inpatient appropriate because: Admitted for Acute blood loss anemia.     Consultants:  PCCM Gastroenterology  Procedures:  Antimicrobials: Anti-infectives (From admission, onward)    None       Subjective: Patient was seen and examined at bedside.  Overnight events noted.   Patient report doing much  better.  She is still feels fatigued but denies any bleeding.  Objective: Vitals:   02/18/23 1552 02/18/23 2119 02/19/23 0508 02/19/23 0722  BP: 128/81 111/77 (!) 123/55 108/68  Pulse: (!) 101 91 (!) 103 (!) 110  Resp: 18 19 20 18   Temp: 98 F (36.7 C) 98 F (36.7 C) 98.5 F (36.9 C) 98.5 F (36.9 C)  TempSrc:  Oral Oral Oral  SpO2: 98% 100% 98% 98%  Weight:   74.5 kg   Height:        Intake/Output Summary (Last 24 hours) at 02/19/2023 1109 Last data filed at 02/18/2023 1400 Gross per 24 hour  Intake 200 ml  Output --  Net 200 ml   Filed Weights   02/16/23 0500 02/18/23 0505 02/19/23 0508  Weight: 71.3 kg 74.3 kg 74.5 kg    Examination:  General exam: Appears calm and comfortable, not in any acute distress. Respiratory system: Clear to auscultation. Respiratory effort normal.  RR 16 Cardiovascular system: S1 & S2 heard, RRR. No JVD, murmurs, rubs, gallops or clicks.  Gastrointestinal system: Abdomen is non distended, soft and non tender.  Normal bowel sounds heard. Central nervous system: Alert and oriented x 3. No focal neurological deficits. Extremities: No edema, no cyanosis, no clubbing Skin: No rashes, lesions or ulcers Psychiatry: Judgement and insight appear normal. Mood & affect appropriate.     Data Reviewed: I have personally reviewed following labs and imaging studies  CBC: Recent Labs  Lab 02/15/23 2138 02/15/23 2153 02/16/23 9173 02/16/23 1231 02/16/23 1836 02/17/23 0806 02/18/23 0744 02/19/23  0659  WBC 24.3*  --  20.4*  --   --  17.3* 18.8* 19.0*  NEUTROABS 20.8*  --   --   --   --   --   --  14.2*  HGB 6.7*   < > 7.8* 8.1* 8.2* 8.2* 8.4* 8.2*  HCT 20.4*   < > 22.0* 23.5* 23.8* 23.5* 25.1* 24.8*  MCV 96.7  --  90.2  --   --  92.5 94.0 95.0  PLT 135*  --  111*  --   --  117* 122* 100*   < > = values in this interval not displayed.   Basic Metabolic Panel: Recent Labs  Lab 02/16/23 0826 02/17/23 0806 02/18/23 0744 02/18/23 0956  02/19/23 0659  NA 137 137 139 137 140  K 3.3* 3.3* 3.8 4.2 4.1  CL 106 103 105 105 107  CO2 24 24 26 24 25   GLUCOSE 184* 270* 215* 375* 141*  BUN 35* 13 <5* <5* 6*  CREATININE 0.54 0.60 0.47 0.57 0.64  CALCIUM  8.7* 8.3* 8.7* 8.6* 8.8*  MG 1.5* 1.5*  --  1.8  --    GFR: Estimated Creatinine Clearance: 65.6 mL/min (by C-G formula based on SCr of 0.64 mg/dL). Liver Function Tests: Recent Labs  Lab 02/15/23 2138  AST 16  ALT 11  ALKPHOS 37*  BILITOT 0.7  PROT 5.0*  ALBUMIN 2.8*   No results for input(s): LIPASE, AMYLASE in the last 168 hours. No results for input(s): AMMONIA in the last 168 hours. Coagulation Profile: Recent Labs  Lab 02/15/23 2351  INR 2.2*   Cardiac Enzymes: No results for input(s): CKTOTAL, CKMB, CKMBINDEX, TROPONINI in the last 168 hours. BNP (last 3 results) No results for input(s): PROBNP in the last 8760 hours. HbA1C: No results for input(s): HGBA1C in the last 72 hours. CBG: Recent Labs  Lab 02/18/23 1047 02/18/23 1205 02/18/23 1551 02/18/23 1620 02/19/23 0725  GLUCAP 358* 377* 89 120* 132*   Lipid Profile: No results for input(s): CHOL, HDL, LDLCALC, TRIG, CHOLHDL, LDLDIRECT in the last 72 hours. Thyroid  Function Tests: No results for input(s): TSH, T4TOTAL, FREET4, T3FREE, THYROIDAB in the last 72 hours. Anemia Panel: No results for input(s): VITAMINB12, FOLATE, FERRITIN, TIBC, IRON, RETICCTPCT in the last 72 hours. Sepsis Labs: Recent Labs  Lab 02/15/23 2211 02/16/23 0008 02/16/23 0826  LATICACIDVEN 5.3* 5.3* 1.3    Recent Results (from the past 240 hours)  MRSA Next Gen by PCR, Nasal     Status: None   Collection Time: 02/15/23 11:52 PM   Specimen: Nasal Mucosa; Nasal Swab  Result Value Ref Range Status   MRSA by PCR Next Gen NOT DETECTED NOT DETECTED Final    Comment: (NOTE) The GeneXpert MRSA Assay (FDA approved for NASAL specimens only), is one component of a  comprehensive MRSA colonization surveillance program. It is not intended to diagnose MRSA infection nor to guide or monitor treatment for MRSA infections. Test performance is not FDA approved in patients less than 11 years old. Performed at [facility], [address] [place], [place] [address]     Radiology Studies: No results found.  Scheduled Meds:  Chlorhexidine  Gluconate Cloth  6 each Topical Daily   diltiazem   120 mg Oral Daily   escitalopram   10 mg Oral Daily   feeding supplement  1 Container Oral TID BM   insulin  aspart  0-15 Units Subcutaneous TID WC   insulin  glargine-yfgn  40 Units Subcutaneous Daily   metFORMIN   1,000 mg Oral BID WC   montelukast   10 mg Oral Daily   pantoprazole   40 mg Oral Daily   rosuvastatin   5 mg Oral Daily   Continuous Infusions:   LOS: 4 days    Time spent: 50 mins    Darcel Dawley, MD [facility]   If 7PM-7AM, please contact night-coverage

## 2023-02-20 DIAGNOSIS — R58 Hemorrhage, not elsewhere classified: Secondary | ICD-10-CM | POA: Diagnosis not present

## 2023-02-20 LAB — GLUCOSE, CAPILLARY
Glucose-Capillary: 192 mg/dL — ABNORMAL HIGH (ref 70–99)
Glucose-Capillary: 236 mg/dL — ABNORMAL HIGH (ref 70–99)
Glucose-Capillary: 129 mg/dL — ABNORMAL HIGH (ref 70–99)
Glucose-Capillary: 111 mg/dL — ABNORMAL HIGH (ref 70–99)

## 2023-02-20 LAB — BASIC METABOLIC PANEL
Anion gap: 9 (ref 5–15)
BUN: 6 mg/dL — ABNORMAL LOW (ref 8–23)
CO2: 24 mmol/L (ref 22–32)
Calcium: 9.1 mg/dL (ref 8.9–10.3)
Chloride: 107 mmol/L (ref 98–111)
Creatinine, Ser: 0.66 mg/dL (ref 0.44–1.00)
GFR, Estimated: >60 mL/min (ref >60)
Glucose, Bld: 160 mg/dL — ABNORMAL HIGH (ref 70–99)
Potassium: 3.9 mmol/L (ref 3.5–5.1)
Sodium: 140 mmol/L (ref 135–145)

## 2023-02-20 LAB — CBC
HCT: 27.7 % — ABNORMAL LOW (ref 36.0–46.0)
Hemoglobin: 9.2 g/dL — ABNORMAL LOW (ref 12.0–15.0)
MCH: 31.5 pg (ref 26.0–34.0)
MCHC: 33.2 g/dL (ref 30.0–36.0)
MCV: 94.9 fL (ref 80.0–100.0)
Platelets: 103 10*3/uL — ABNORMAL LOW (ref 150–400)
RBC: 2.92 MIL/uL — ABNORMAL LOW (ref 3.87–5.11)
RDW: 15.4 % (ref 11.5–15.5)
WBC: 17.8 10*3/uL — ABNORMAL HIGH (ref 4.0–10.5)
nRBC: 0.2 % (ref 0.0–0.2)

## 2023-02-20 LAB — PHOSPHORUS: Phosphorus: 4.1 mg/dL (ref 2.5–4.6)

## 2023-02-20 LAB — MAGNESIUM: Magnesium: 1.8 mg/dL (ref 1.7–2.4)

## 2023-02-20 LAB — HEMOGLOBIN AND HEMATOCRIT, BLOOD
HCT: 26.5 % — ABNORMAL LOW (ref 36.0–46.0)
Hemoglobin: 8.7 g/dL — ABNORMAL LOW (ref 12.0–15.0)

## 2023-02-20 MED ORDER — BISACODYL 10 MG RE SUPP: 10.0000 mg | Freq: Once | RECTAL | Status: DC

## 2023-02-20 NOTE — Plan of Care (Signed)
  Problem: Coping: Goal: Ability to adjust to condition or change in health will improve Outcome: Progressing   Problem: Fluid Volume: Goal: Ability to maintain a balanced intake and output will improve Outcome: Progressing   Problem: Health Behavior/Discharge Planning: Goal: Ability to manage health-related needs will improve Outcome: Progressing   Problem: Metabolic: Goal: Ability to maintain appropriate glucose levels will improve Outcome: Progressing   Problem: Nutritional: Goal: Maintenance of adequate nutrition will improve Outcome: Progressing   Problem: Health Behavior/Discharge Planning: Goal: Ability to manage health-related needs will improve Outcome: Progressing   Problem: Elimination: Goal: Will not experience complications related to bowel motility Outcome: Progressing   Problem: Pain Management: Goal: General experience of comfort will improve Outcome: Progressing

## 2023-02-20 NOTE — Progress Notes (Signed)
 PROGRESS NOTE    Erika [name]  FMW:[id number] DOB: [birth year] DOA: 02/15/2023 PCP: Cleotilde, [place]  E, PA   Brief Narrative:  This 68 yrs old female with PMH significant for Anxiety, Hyperlipidemia, DM2 with hyperglycemia, Chronic leukocytosis, PAF on chronic eliquis, Dysphagia with esophageal stricture, Diabetic neuropathy , underwent colonoscopy and EGD on 02/11/23 and sent home on 02/12/23, Patient returned and admitted to ICU for anemia, hemoglobin of 6.1 with dark tarry stools, hypotension and atrial fib with RVR. GI consulted for possible post colectomy bleed. Eliquis is held. Her hemoglobin improved and remains stable. She was transferred to [facility] service on 1/10.  H&H remains stable.  Assessment & Plan:   Principal Problem:   Acute bleeding  Acute blood loss anemia: Possible post polypectomy bleed; s/p 2 units of prbc and 2 units of FFP.  Bleeding has stopped. H/H remains stable. Tolerating diet well. Hold  eliquis for 10 days.  Continue with PPI.  She has recurrence of dark colored stool.  Monitor H/H in am.   Atrial fibrillation with RVR  Resolved.  Continue diltiazem  CD 120 mg daily. Hold Eliquis for 10 days in the setting of GI bleed.   DM II with hyperglycemia: Continue SSI.  Continue Semglee  40 units daily.    H/o chronic leukocytosis:  Monitor CBC   Dysphagia with esophageal stricture: S/p EGD with dilation.      Overweight: Estimated body mass index is 28.12 kg/m as calculated from the following:   Height as of this encounter: 5' 4 (1.626 m).   Weight as of this encounter: 74.3 kg.   DVT prophylaxis: SCDs Code Status:Full code Family Communication: Daughter at bed side. Disposition Plan:  Status is: Inpatient Remains inpatient appropriate because: Admitted for Acute blood loss anemia. Patient not comfortable going home until has normal colored stool.    Consultants:  PCCM Gastroenterology  Procedures:  Antimicrobials: Anti-infectives (From  admission, onward)    None       Subjective: Patient was seen and examined at bedside.  Overnight events noted.   Patient reports doing better.  She has dark-colored stool , not comfortable going home. Objective: Vitals:   02/19/23 1640 02/19/23 2056 02/20/23 0451 02/20/23 0743  BP: (!) 145/77 (!) 117/43 114/70 115/65  Pulse: (!) 107 95 91 99  Resp: 16 18 16 18   Temp: 98.3 F (36.8 C) 98.8 F (37.1 C) 97.7 F (36.5 C)   TempSrc: Oral Oral Oral   SpO2: 100% 98% 100% 98%  Weight:      Height:       No intake or output data in the 24 hours ending 02/20/23 1357  Filed Weights   02/16/23 0500 02/18/23 0505 02/19/23 0508  Weight: 71.3 kg 74.3 kg 74.5 kg    Examination:  General exam: Appears calm and comfortable, not in any acute distress. Respiratory system: Clear to auscultation. Respiratory effort normal.  RR 16 Cardiovascular system: S1 & S2 heard, RRR. No JVD, murmurs, rubs, gallops or clicks.  Gastrointestinal system: Abdomen is non distended, soft and non tender.  Normal bowel sounds heard. Central nervous system: Alert and oriented x 3. No focal neurological deficits. Extremities: No edema, no cyanosis, no clubbing Skin: No rashes, lesions or ulcers Psychiatry: Judgement and insight appear normal. Mood & affect appropriate.     Data Reviewed: I have personally reviewed following labs and imaging studies  CBC: Recent Labs  Lab 02/15/23 2138 02/15/23 2153 02/16/23 9173 02/16/23 1231 02/16/23 1836 02/17/23 9193 02/18/23 0744 02/19/23 9340  02/20/23 0704  WBC 24.3*  --  20.4*  --   --  17.3* 18.8* 19.0* 17.8*  NEUTROABS 20.8*  --   --   --   --   --   --  14.2*  --   HGB 6.7*   < > 7.8*   < > 8.2* 8.2* 8.4* 8.2* 9.2*  HCT 20.4*   < > 22.0*   < > 23.8* 23.5* 25.1* 24.8* 27.7*  MCV 96.7  --  90.2  --   --  92.5 94.0 95.0 94.9  PLT 135*  --  111*  --   --  117* 122* 100* 103*   < > = values in this interval not displayed.   Basic Metabolic Panel: Recent  Labs  Lab 02/16/23 0826 02/17/23 0806 02/18/23 0744 02/18/23 0956 02/19/23 0659 02/20/23 0704  NA 137 137 139 137 140 140  K 3.3* 3.3* 3.8 4.2 4.1 3.9  CL 106 103 105 105 107 107  CO2 24 24 26 24 25 24   GLUCOSE 184* 270* 215* 375* 141* 160*  BUN 35* 13 <5* <5* 6* 6*  CREATININE 0.54 0.60 0.47 0.57 0.64 0.66  CALCIUM  8.7* 8.3* 8.7* 8.6* 8.8* 9.1  MG 1.5* 1.5*  --  1.8  --  1.8  PHOS  --   --   --   --   --  4.1   GFR: Estimated Creatinine Clearance: 65.6 mL/min (by C-G formula based on SCr of 0.66 mg/dL). Liver Function Tests: Recent Labs  Lab 02/15/23 2138  AST 16  ALT 11  ALKPHOS 37*  BILITOT 0.7  PROT 5.0*  ALBUMIN 2.8*   No results for input(s): LIPASE, AMYLASE in the last 168 hours. No results for input(s): AMMONIA in the last 168 hours. Coagulation Profile: Recent Labs  Lab 02/15/23 2351  INR 2.2*   Cardiac Enzymes: No results for input(s): CKTOTAL, CKMB, CKMBINDEX, TROPONINI in the last 168 hours. BNP (last 3 results) No results for input(s): PROBNP in the last 8760 hours. HbA1C: No results for input(s): HGBA1C in the last 72 hours. CBG: Recent Labs  Lab 02/19/23 0725 02/19/23 1141 02/19/23 1642 02/19/23 2105 02/20/23 0744  GLUCAP 132* 168* 196* 117* 192*   Lipid Profile: No results for input(s): CHOL, HDL, LDLCALC, TRIG, CHOLHDL, LDLDIRECT in the last 72 hours. Thyroid  Function Tests: No results for input(s): TSH, T4TOTAL, FREET4, T3FREE, THYROIDAB in the last 72 hours. Anemia Panel: No results for input(s): VITAMINB12, FOLATE, FERRITIN, TIBC, IRON, RETICCTPCT in the last 72 hours. Sepsis Labs: Recent Labs  Lab 02/15/23 2211 02/16/23 0008 02/16/23 0826  LATICACIDVEN 5.3* 5.3* 1.3    Recent Results (from the past 240 hours)  MRSA Next Gen by PCR, Nasal     Status: None   Collection Time: 02/15/23 11:52 PM   Specimen: Nasal Mucosa; Nasal Swab  Result Value Ref Range Status   MRSA by  PCR Next Gen NOT DETECTED NOT DETECTED Final    Comment: (NOTE) The GeneXpert MRSA Assay (FDA approved for NASAL specimens only), is one component of a comprehensive MRSA colonization surveillance program. It is not intended to diagnose MRSA infection nor to guide or monitor treatment for MRSA infections. Test performance is not FDA approved in patients less than 94 years old. Performed at [facility], [address], [place], [place] [address]     Radiology Studies: No results found.  Scheduled Meds:  bisacodyl   10 mg Rectal Once   Chlorhexidine  Gluconate Cloth  6 each Topical Daily   diltiazem   120 mg Oral Daily   escitalopram   10 mg Oral Daily   feeding supplement  1 Container Oral TID BM   insulin  aspart  0-15 Units Subcutaneous TID WC   insulin  glargine-yfgn  40 Units Subcutaneous Daily   metFORMIN   1,000 mg Oral BID WC   montelukast   10 mg Oral Daily   pantoprazole   40 mg Oral Daily   rosuvastatin   5 mg Oral Daily   senna  1 tablet Oral BID   Continuous Infusions:   LOS: 5 days    Time spent: 35 mins    Darcel Dawley, MD [facility]   If 7PM-7AM, please contact night-coverage

## 2023-02-21 DIAGNOSIS — R58 Hemorrhage, not elsewhere classified: Secondary | ICD-10-CM | POA: Diagnosis not present

## 2023-02-21 LAB — BASIC METABOLIC PANEL
Anion gap: 8 (ref 5–15)
BUN: 6 mg/dL — ABNORMAL LOW (ref 8–23)
CO2: 25 mmol/L (ref 22–32)
Calcium: 8.6 mg/dL — ABNORMAL LOW (ref 8.9–10.3)
Chloride: 106 mmol/L (ref 98–111)
Creatinine, Ser: 0.5 mg/dL (ref 0.44–1.00)
GFR, Estimated: >60 mL/min (ref >60)
Glucose, Bld: 79 mg/dL (ref 70–99)
Potassium: 3.6 mmol/L (ref 3.5–5.1)
Sodium: 139 mmol/L (ref 135–145)

## 2023-02-21 LAB — MAGNESIUM: Magnesium: 1.9 mg/dL (ref 1.7–2.4)

## 2023-02-21 LAB — CBC
HCT: 25.4 % — ABNORMAL LOW (ref 36.0–46.0)
Hemoglobin: 8.3 g/dL — ABNORMAL LOW (ref 12.0–15.0)
MCH: 31.1 pg (ref 26.0–34.0)
MCHC: 32.7 g/dL (ref 30.0–36.0)
MCV: 95.1 fL (ref 80.0–100.0)
Platelets: 101 10*3/uL — ABNORMAL LOW (ref 150–400)
RBC: 2.67 MIL/uL — ABNORMAL LOW (ref 3.87–5.11)
RDW: 15.2 % (ref 11.5–15.5)
WBC: 17.2 10*3/uL — ABNORMAL HIGH (ref 4.0–10.5)
nRBC: 0.2 % (ref 0.0–0.2)

## 2023-02-21 LAB — GLUCOSE, CAPILLARY
Glucose-Capillary: 71 mg/dL (ref 70–99)
Glucose-Capillary: 77 mg/dL (ref 70–99)
Glucose-Capillary: 87 mg/dL (ref 70–99)

## 2023-02-21 LAB — PHOSPHORUS: Phosphorus: 4.3 mg/dL (ref 2.5–4.6)

## 2023-02-21 MED ORDER — FERROUS SULFATE 325 (65 FE) MG PO TBEC: 325.0000 mg | DELAYED_RELEASE_TABLET | ORAL | 0 refills | Status: DC

## 2023-02-21 MED ORDER — PANTOPRAZOLE SODIUM 40 MG PO TBEC: 40.0000 mg | DELAYED_RELEASE_TABLET | Freq: Every day | ORAL | 1 refills | Status: AC

## 2023-02-21 NOTE — Plan of Care (Signed)
  Problem: Education: Goal: Ability to describe self-care measures that may prevent or decrease complications (Diabetes Survival Skills Education) will improve Outcome: Progressing Goal: Individualized Educational Video(s) Outcome: Progressing   Problem: Coping: Goal: Ability to adjust to condition or change in health will improve Outcome: Progressing   Problem: Fluid Volume: Goal: Ability to maintain a balanced intake and output will improve Outcome: Progressing   Problem: Health Behavior/Discharge Planning: Goal: Ability to identify and utilize available resources and services will improve Outcome: Progressing Goal: Ability to manage health-related needs will improve Outcome: Progressing   Problem: Metabolic: Goal: Ability to maintain appropriate glucose levels will improve Outcome: Progressing   Problem: Nutritional: Goal: Maintenance of adequate nutrition will improve Outcome: Progressing Goal: Progress toward achieving an optimal weight will improve Outcome: Progressing   Problem: Skin Integrity: Goal: Risk for impaired skin integrity will decrease Outcome: Progressing   Problem: Tissue Perfusion: Goal: Adequacy of tissue perfusion will improve Outcome: Progressing   Problem: Health Behavior/Discharge Planning: Goal: Ability to manage health-related needs will improve Outcome: Progressing   Problem: Clinical Measurements: Goal: Ability to maintain clinical measurements within normal limits will improve Outcome: Progressing Goal: Will remain free from infection Outcome: Progressing Goal: Diagnostic test results will improve Outcome: Progressing Goal: Respiratory complications will improve Outcome: Progressing Goal: Cardiovascular complication will be avoided Outcome: Progressing   Problem: Activity: Goal: Risk for activity intolerance will decrease Outcome: Progressing   Problem: Nutrition: Goal: Adequate nutrition will be maintained Outcome:  Progressing   Problem: Coping: Goal: Level of anxiety will decrease Outcome: Progressing   Problem: Elimination: Goal: Will not experience complications related to bowel motility Outcome: Progressing Goal: Will not experience complications related to urinary retention Outcome: Progressing

## 2023-02-21 NOTE — Discharge Summary (Signed)
 Erika [name] FMW:[id number] DOB: 16-May-1955 DOA: 02/15/2023  PCP: Cleotilde Greathouse  E, PA  Admit date: 02/15/2023 Discharge date: 02/21/2023  Time spent: 35 minutes  Recommendations for Outpatient Follow-up:  Pcp and GI f/u Hold apixaban until 1/19     Discharge Diagnoses:  Principal Problem:   Acute bleeding Active Problems:   Diabetes mellitus (HCC)   Atrial fibrillation [facility])   Discharge Condition: improved  Diet recommendation: heart healthy  Filed Weights   02/18/23 0505 02/19/23 0508 02/21/23 0437  Weight: 74.3 kg 74.5 kg 73 kg    History of present illness:  From hpi: 68 yo female presented with confusion that has been progressively worsening since Sunday. Pt reportedly had colonoscopy and endoscopy on 1/3 and was home tired on 1/4. This did not improve and 1/5 pt worsened with weakness and overall less energy. Pt's daughter provides history as pt is altered at this time. She endorses that pt was becoming increasingly sob with walking. Denied c/o chest pain, fever/chills/cough/n/v/d but was noted to have darkening stools after her endoscopy.    Daughter states she was planning to transport pt to the ED but pt was unable to mobilize and thus EMS was contacted. Upon arrival she was noted to be in afib with RVR as well as hypotensive. She was given 1L Ivf. Hgb noted to be 6.1 from baseline of 13-14 (as of 9/24).  Hospital Course:  Patient presents with symptomatic anemia (hgb in 6s from baseline normal) and melena 2 days after EGD with dilation and colonoscopy with polypectomy. Here was treated with 2 units prbcs and 2 units ffp. GI consulted, bleeding thought to be 2/2 polypectomy. Endoluminal eval deferred. Patient was treated with PPI. Hemoglobin stable in the 8s since last transfusion which was on 1/8. Stool day of discharge no blood, not melanotic. Feeling a bit fatigued but otherwise well. GI advises holding apixaban for 10 days from 10/9 (so ok to resume 1/19), consider GI  or pcp f/u prior to that to check in and consider repeat of cbc. Continue PPI until f/u with GI. Other chronic medical problems stable.   Procedures: none   Consultations: GI  Discharge Exam: Vitals:   02/21/23 0431 02/21/23 0733  BP: (!) 105/56 100/72  Pulse: 100 83  Resp: 17 18  Temp: 98.2 F (36.8 C)   SpO2: 100% 99%    General: NAD Cardiovascular: RRR Respiratory: CTAB Abdomen: soft, non-tender  Discharge Instructions   Discharge Instructions     Ambulatory referral to Physical Therapy   Complete by: As directed    Iontophoresis - 4 mg/ml of dexamethasone : No   T.E.N.S. Unit Evaluation and Dispense as Indicated: No   Diet - low sodium heart healthy   Complete by: As directed    Increase activity slowly   Complete by: As directed       Allergies as of 02/21/2023       Reactions   Amoxicillin    Only one case of HIVES.  Has taken it in the past.   Fluticasone Propionate    Other reaction(s): foot cramps-mild   Nsaids Nausea And Vomiting   Tetanus Toxoids Other (See Comments)   Very high fever   Tramadol  Hcl    Other reaction(s): agitated        Medication List     STOP taking these medications    Eliquis 5 MG Tabs tablet Generic drug: apixaban       TAKE these medications    ALPRAZolam  0.25 MG  tablet Commonly known as: XANAX  Take 0.25 mg by mouth at bedtime as needed.   Artificial Tears 0.1-0.3 % Soln Generic drug: Dextran 70-Hypromellose Place 2 drops into both eyes daily as needed (for dry eye).   Calcium  Carb-Cholecalciferol 600-800 MG-UNIT Tabs Take 1 tablet by mouth 2 (two) times daily.   cetirizine 10 MG tablet Commonly known as: ZYRTEC Take 10 mg by mouth daily.   Dilt-XR 120 MG 24 hr capsule Generic drug: diltiazem  Take 120 mg by mouth daily.   escitalopram  10 MG tablet Commonly known as: LEXAPRO  Take 1 tablet by mouth daily.   ferrous sulfate  325 (65 FE) MG EC tablet Take 1 tablet (325 mg total) by mouth every  other day.   FISH OIL PO Take 1 tablet by mouth daily.   FREESTYLE LITE test strip Generic drug: glucose blood 1 each by Other route as needed.   lisinopril  2.5 MG tablet Commonly known as: ZESTRIL  Take 2.5 mg by mouth every morning.   metFORMIN  500 MG 24 hr tablet Commonly known as: GLUCOPHAGE -XR Take 1,000 mg by mouth 2 (two) times daily.   montelukast  10 MG tablet Commonly known as: SINGULAIR  Take 10 mg by mouth daily.   pantoprazole  40 MG tablet Commonly known as: PROTONIX  Take 1 tablet (40 mg total) by mouth daily.   Potassium Gluconate 550 MG Tabs Take 2 tablets by mouth every evening.   rosuvastatin  5 MG tablet Commonly known as: CRESTOR  Take 5 mg by mouth daily.   Toujeo  SoloStar 300 UNIT/ML Solostar Pen Generic drug: insulin  glargine (1 Unit Dial ) Inject 40 Units into the skin daily.   Trulicity  3 MG/0.5ML Soaj Generic drug: Dulaglutide  Inject 3 mg into the skin once a week.       Allergies  Allergen Reactions   Amoxicillin     Only one case of HIVES.  Has taken it in the past.   Fluticasone Propionate     Other reaction(s): foot cramps-mild   Nsaids Nausea And Vomiting   Tetanus Toxoids Other (See Comments)    Very high fever   Tramadol  Hcl     Other reaction(s): agitated    Follow-up Information     [facility] at [facility] Follow up.   Specialty: Rehabilitation Why: Please follwo up with the Outpatient Center regarding Outpatient therapy needs. Contact information: [address] [place]  [address] [phone]        Saintclair Jasper, MD Follow up.   Specialty: Gastroenterology Contact information: [address] [place] [address] [phone]         Cleotilde, Virginia  E, PA Follow up.   Specialty: Internal Medicine Contact information: [address] [place] [address] [phone]                  The results of  significant diagnostics from this hospitalization (including imaging, microbiology, ancillary and laboratory) are listed below for reference.    Significant Diagnostic Studies: CT Head Wo Contrast Result Date: 02/15/2023 CLINICAL DATA:  Mental status change, unknown cause EXAM: CT HEAD WITHOUT CONTRAST TECHNIQUE: Contiguous axial images were obtained from the base of the skull through the vertex without intravenous contrast. RADIATION DOSE REDUCTION: This exam was performed according to the departmental dose-optimization program which includes automated exposure control, adjustment of the mA and/or kV according to patient size and/or use of iterative reconstruction technique. COMPARISON:  None Available. FINDINGS: Brain: There is atrophy and chronic small vessel disease changes.  No acute intracranial abnormality. Specifically, no hemorrhage, hydrocephalus, mass lesion, acute infarction, or significant intracranial injury. Vascular: No hyperdense vessel or unexpected calcification. Skull: No acute calvarial abnormality. Sinuses/Orbits: No acute findings Other: None IMPRESSION: Atrophy, chronic microvascular disease. No acute intracranial abnormality. Electronically Signed   By: Franky Crease M.D.   On: 02/15/2023 22:34   DG Chest Portable 1 View Result Date: 02/15/2023 CLINICAL DATA:  Weakness, atrial fibrillation. Kidney along with your brother you think EXAM: PORTABLE CHEST 1 VIEW COMPARISON:  None Available. FINDINGS: The heart size and mediastinal contours are within normal limits. Both lungs are clear. The visualized skeletal structures are unremarkable. IMPRESSION: No active disease. Electronically Signed   By: Greig Pique M.D.   On: 02/15/2023 21:50    Microbiology: Recent Results (from the past 240 hours)  MRSA Next Gen by PCR, Nasal     Status: None   Collection Time: 02/15/23 11:52 PM   Specimen: Nasal Mucosa; Nasal Swab  Result Value Ref Range Status   MRSA by PCR Next Gen NOT DETECTED NOT  DETECTED Final    Comment: (NOTE) The GeneXpert MRSA Assay (FDA approved for NASAL specimens only), is one component of a comprehensive MRSA colonization surveillance program. It is not intended to diagnose MRSA infection nor to guide or monitor treatment for MRSA infections. Test performance is not FDA approved in patients less than 3 years old. Performed at [facility], [address] [place], [place] [address]      Labs: Basic Metabolic Panel: Recent Labs  Lab 02/16/23 [id number] 02/17/23 0806 02/18/23 0744 02/18/23 0956 02/19/23 0659 02/20/23 0704 02/21/23 0551  NA 137 137 139 137 140 140 139  K 3.3* 3.3* 3.8 4.2 4.1 3.9 3.6  CL 106 103 105 105 107 107 106  CO2 24 24 26 24 25 24 25   GLUCOSE 184* 270* 215* 375* 141* 160* 79  BUN 35* 13 <5* <5* 6* 6* 6*  CREATININE 0.54 0.60 0.47 0.57 0.64 0.66 0.50  CALCIUM  8.7* 8.3* 8.7* 8.6* 8.8* 9.1 8.6*  MG 1.5* 1.5*  --  1.8  --  1.8 1.9  PHOS  --   --   --   --   --  4.1 4.3   Liver Function Tests: Recent Labs  Lab 02/15/23 2138  AST 16  ALT 11  ALKPHOS 37*  BILITOT 0.7  PROT 5.0*  ALBUMIN 2.8*   No results for input(s): LIPASE, AMYLASE in the last 168 hours. No results for input(s): AMMONIA in the last 168 hours. CBC: Recent Labs  Lab 02/15/23 2138 02/15/23 2153 02/17/23 0806 02/18/23 0744 02/19/23 0659 02/20/23 0704 02/20/23 1500 02/21/23 0551  WBC 24.3*   < > 17.3* 18.8* 19.0* 17.8*  --  17.2*  NEUTROABS 20.8*  --   --   --  14.2*  --   --   --   HGB 6.7*   < > 8.2* 8.4* 8.2* 9.2* 8.7* 8.3*  HCT 20.4*   < > 23.5* 25.1* 24.8* 27.7* 26.5* 25.4*  MCV 96.7   < > 92.5 94.0 95.0 94.9  --  95.1  PLT 135*   < > 117* 122* 100* 103*  --  101*   < > = values in this interval not displayed.   Cardiac Enzymes: No results for input(s): CKTOTAL, CKMB, CKMBINDEX, TROPONINI in the last 168 hours. BNP: BNP (last 3 results) No results for input(s): BNP in the last 8760 hours.  ProBNP (last 3  results) No  results for input(s): PROBNP in the last 8760 hours.  CBG: Recent Labs  Lab 02/20/23 1617 02/20/23 2212 02/21/23 0030 02/21/23 0433 02/21/23 0731  GLUCAP 129* 111* 87 71 77       Signed:  Devaughn KATHEE Ban MD.  [facility] 02/21/2023, 9:34 AM

## 2023-02-21 NOTE — Discharge Instructions (Signed)
 Hold your apixaban until 1/19.

## 2023-03-03 ENCOUNTER — Encounter: Payer: Self-pay | Admitting: Internal Medicine

## 2023-03-03 ENCOUNTER — Ambulatory Visit: Payer: Medicare Other | Attending: Internal Medicine | Admitting: Internal Medicine

## 2023-03-03 VITALS — BP 98/58 | HR 94 | Ht 64.0 in | Wt 158.0 lb

## 2023-03-03 DIAGNOSIS — I480 Paroxysmal atrial fibrillation: Secondary | ICD-10-CM | POA: Insufficient documentation

## 2023-03-03 DIAGNOSIS — Z7901 Long term (current) use of anticoagulants: Secondary | ICD-10-CM | POA: Diagnosis present

## 2023-03-03 DIAGNOSIS — I10 Essential (primary) hypertension: Secondary | ICD-10-CM | POA: Diagnosis present

## 2023-03-03 DIAGNOSIS — K922 Gastrointestinal hemorrhage, unspecified: Secondary | ICD-10-CM | POA: Insufficient documentation

## 2023-03-03 DIAGNOSIS — E782 Mixed hyperlipidemia: Secondary | ICD-10-CM | POA: Diagnosis present

## 2023-03-03 NOTE — Patient Instructions (Signed)
Medication Instructions:  STOP     *If you need a refill on your cardiac medications before your next appointment, please call your pharmacy*   Lab Work: None    If you have labs (blood work) drawn today and your tests are completely normal, you will receive your results only by: MyChart Message (if you have MyChart) OR A paper copy in the mail If you have any lab test that is abnormal or we need to change your treatment, we will call you to review the results.   Testing/Procedures: None    Follow-Up: At [facility], you and your health needs are our priority.  As part of our continuing mission to provide you with exceptional heart care, we have created designated Provider Care Teams.  These Care Teams include your primary Cardiologist (physician) and Advanced Practice Providers (APPs -  Physician Assistants and Nurse Practitioners) who all work together to provide you with the care you need, when you need it.  We recommend signing up for the patient portal called "MyChart".  Sign up information is provided on this After Visit Summary.  MyChart is used to connect with patients for Virtual Visits (Telemedicine).  Patients are able to view lab/test results, encounter notes, upcoming appointments, etc.  Non-urgent messages can be sent to your provider as well.   To learn more about what you can do with MyChart, go to [email or web address].    Your next appointment:   6 month(s)  The format for your next appointment:   In Person  Provider:   Chrystie Nose, MD    Other Instructions  Dr. Rennis Golden has referred you to [facility] Electrophysiology Department at [facility] with Dr. Steffanie Dunn for [facility] DEVICE consultation.

## 2023-03-03 NOTE — Progress Notes (Signed)
OFFICE CONSULT NOTE  Chief Complaint:  Follow-up GI bleed  Primary Care Physician: Collene Mares, PA  HPI:  Erika [name] is a 68 y.o. female who is being seen today for the evaluation of atrial fibrillation at the request of Erika [name], [place], [place]. this is a pleasant 68 year old female seen initially by Dr. Delton See in 2015 for cardiac clearance and then followed by Dr. Elease Hashimoto for atrial fibrillation.  Her husband is a patient of mine and she requested to switch to me for her primary cardiologist.  She is here to establish care.  She has a history of A. fib and it has generally been rate controlled.  She is anticoagulated on Eliquis and takes diltiazem.  She also is on low-dose lisinopril for diabetes particularly.  She takes Crestor and is on Trulicity, Januvia and metformin for her diabetes.  Labs in July 2022 showed total cholesterol 134, HDL 29, triglycerides 172 and LDL 75.  A1c 6.4%.  06/29/2021  Erika [name] is seen today in follow-up.  She continues to be asymptomatic with regards to her A-fib.  Heart rate at worst about 110 but in general in the low 100s to upper 90s.  Blood pressure runs in the low 100s and therefore there is little room to uptitrate her medication.  She is on diltiazem.  She remains on Eliquis.  Cholesterol was reassessed last summer and she is due for follow-up which she says is in August with her PCP.  She remains asymptomatic  03/03/2023  Erika [name] is here today for follow-up.  Unfortunately, she had recently been hospitalized this month for progressive anemia.  She had a colonoscopy and endoscopy early in January and developed severe weakness.  She was found to be anemic with a hemoglobin of 6, requiring transfusion.  At discharge her hemoglobin had come up to 8 and was stable.  She was previously anticoagulated prior to this procedure.  It is not clear if she was advised prior to the procedure to hold Eliquis or whether she was advised and did not stop it.  She is  not currently on any anticoagulation.  She does remain in A-fib which is rate controlled and considered permanent.  She does have a high stroke risk.  Although the bleeding seems to have occurred after her procedure which included dilatation of the esophagus, it is felt that she may be at high risk of recurrent bleeding in the future.  I did discuss the Watchman device procedure today with her as a possible option.  She continues to feel somewhat fatigued.  Blood pressure today was low at 98/58.  She denies any recurrent bleeding.  PMHx:  Past Medical History:  Diagnosis Date   Anemia    "just related to fibroids"   Anxiety    takes Clonazepam daily as needed   Arthritis    "wrists; neck" (04/10/2013)   Basal cell carcinoma of nasal tip    Cataract    bilateral   Cramping of feet    takes Potassium daily   Dysphagia    History of colon polyps    Hyperlipidemia    takes Crestor nightly "because I'm a diabetic"   Hypertension    Insomnia    takes Benadryl nightly   Joint pain    Joint swelling    Neuromuscular disorder (HCC)    feet   Neuropathy    both feet   Osteoarthritis of right knee 04/10/2013   Seasonal allergies    takes  Omnaris and Claritin daily as needed   SVD (spontaneous vaginal delivery)    x 1   Type II diabetes mellitus (HCC)    takes Janumet daily   Urinary frequency     Past Surgical History:  Procedure Laterality Date   ANTERIOR AND POSTERIOR REPAIR N/A 02/06/2014   Procedure: ANTERIOR (CYSTOCELE) AND POSTERIOR REPAIR (RECTOCELE);  Surgeon: Erika Hamman, MD;  Location: [facility];  Service: Gynecology;  Laterality: N/A;   BONE SPURS Left 1970's   "ankle"   CLOSED REDUCTION NASAL FRACTURE  ~ 2012   COLONOSCOPY     HYSTEROSCOPY WITH RESECTOSCOPE  2004   "w/fibroids removed"   JOINT REPLACEMENT     right knee   MELANOMA EXCISION  2014   "tip of [name]"   TONSILLECTOMY  ~ 1959   TOTAL KNEE ARTHROPLASTY Right 04/10/2013   TOTAL KNEE ARTHROPLASTY Right 04/10/2013    Procedure: TOTAL KNEE ARTHROPLASTY;  Surgeon: Erika Post, MD;  Location: [facility] OR;  Service: Orthopedics;  Laterality: Right;   VAGINAL HYSTERECTOMY N/A 02/06/2014   Procedure: HYSTERECTOMY VAGINAL;  Surgeon: Erika Hamman, MD;  Location: [facility];  Service: Gynecology;  Laterality: N/A;    FAMHx:  Family History  Problem Relation Age of Onset   Bronchitis Mother    Liver cancer Father    Diabetes Sister     SOCHx:   reports that she has never smoked. She has never used smokeless tobacco. She reports current alcohol use of about 3.0 standard drinks of alcohol per week. She reports that she does not use drugs.  ALLERGIES:  Allergies  Allergen Reactions   Amoxicillin     Only one case of HIVES.  Has taken it in the past.   Fluticasone Propionate     Other reaction(s): foot cramps-mild   Nsaids Nausea And Vomiting   Tetanus Toxoids Other (See Comments)    Very high fever   Tramadol Hcl     Other reaction(s): agitated    ROS: Pertinent items noted in HPI and remainder of comprehensive ROS otherwise negative.  HOME MEDS: Current Outpatient Medications on File Prior to Visit  Medication Sig Dispense Refill   ALPRAZolam (XANAX) 0.25 MG tablet Take 0.25 mg by mouth at bedtime as needed.     ARTIFICIAL TEARS 0.1-0.3 % SOLN Place 2 drops into both eyes daily as needed (for dry eye).     Calcium Carb-Cholecalciferol 600-800 MG-UNIT TABS Take 1 tablet by mouth 2 (two) times daily.     cetirizine (ZYRTEC) 10 MG tablet Take 10 mg by mouth daily.     DILT-XR 120 MG 24 hr capsule Take 120 mg by mouth daily.     Dulaglutide (TRULICITY) 3 MG/0.5ML SOPN Inject 3 mg into the skin once a week. 2 mL 2   escitalopram (LEXAPRO) 10 MG tablet Take 1 tablet by mouth daily.     ferrous sulfate 325 (65 FE) MG EC tablet Take 1 tablet (325 mg total) by mouth every other day. 60 tablet 0   FREESTYLE LITE test strip 1 each by Other route as needed.     insulin glargine, 1 Unit Dial, (TOUJEO SOLOSTAR)  300 UNIT/ML Solostar Pen Inject 40 Units into the skin daily.     lisinopril (PRINIVIL,ZESTRIL) 2.5 MG tablet Take 2.5 mg by mouth every morning.      metFORMIN (GLUCOPHAGE-XR) 500 MG 24 hr tablet Take 1,000 mg by mouth 2 (two) times daily.     montelukast (SINGULAIR) 10 MG tablet Take 10  mg by mouth daily.     Omega-3 Fatty Acids (FISH OIL PO) Take 1 tablet by mouth daily.     pantoprazole (PROTONIX) 40 MG tablet Take 1 tablet (40 mg total) by mouth daily. 30 tablet 1   Potassium Gluconate 550 MG TABS Take 2 tablets by mouth every evening.      rosuvastatin (CRESTOR) 5 MG tablet Take 5 mg by mouth daily.     No current facility-administered medications on file prior to visit.    LABS/IMAGING: No results found for this or any previous visit (from the past 48 hours). No results found.  LIPID PANEL: No results found for: "CHOL", "TRIG", "HDL", "CHOLHDL", "VLDL", "LDLCALC", "LDLDIRECT"  WEIGHTS: Wt Readings from Last 3 Encounters:  03/03/23 158 lb (71.7 kg)  02/21/23 160 lb 15 oz (73 kg)  11/18/22 158 lb 14.4 oz (72.1 kg)    VITALS: BP (!) 98/58 (BP Location: Left Arm, Patient Position: Sitting)   Pulse 94   Ht 5\' 4"  (1.626 m)   Wt 158 lb (71.7 kg)   SpO2 99%   BMI 27.12 kg/m   EXAM: General appearance: alert and no distress Neck: no carotid bruit, no JVD, and thyroid not enlarged, symmetric, no tenderness/mass/nodules Lungs: clear to auscultation bilaterally Heart: irregularly irregular rhythm Abdomen: soft, non-tender; bowel sounds normal; no masses,  no organomegaly Extremities: extremities normal, atraumatic, no cyanosis or edema Pulses: 2+ and symmetric Skin: Skin color, texture, turgor normal. No rashes or lesions Neurologic: Grossly normal Psych: Pleasant  EKG: EKG Interpretation Date/Time:  Thursday March 03 2023 10:27:40 EST Ventricular Rate:  94 PR Interval:    QRS Duration:  76 QT Interval:  390 QTC Calculation: 487 R Axis:   34  Text  Interpretation: Atrial fibrillation with premature ventricular or aberrantly conducted complexes When compared with ECG of 15-Feb-2023 21:33, PREVIOUS ECG IS PRESENT Compared to previous tracing the rate is slower Confirmed by Erika [name] [phone]) on 03/03/2023 10:37:35 AM   ASSESSMENT: Longstanding persistent atrial fibrillation (severe LAE, LVEF 60 to 65%)-10/2019 CHA2DS2-VASc score of 4 Recent acute blood loss anemia postprocedure, not currently on anticoagulation Hypertension Type 2 diabetes Dyslipidemia  PLAN: 1.   Ms. Klehr still feels fatigued after recent hospitalization for acute GI bleeding.  Her hemoglobin had improved up to 8.  Blood pressure is low today.  I advised stopping her lisinopril.  We discussed the Watchman procedure.  I think this is a good option for her even though her bleeding may have been procedurally related I am concerned about her long-term bleeding risk and of course for stroke risk with a high CHA2DS2-VASc score.  She was agreeable to a referral to discuss this more with my colleagues.  I have seen Erika [name] is a 68 y.o. female in the office today who is being considered for a Watchman left atrial appendage closure device.  She has a history of longstanding persistent atrial fibrillation.  This patients CHA2DS2-VASc Score and unadjusted Ischemic Stroke Rate (% per year) is equal to 4.8 % stroke rate/year from a score of 4 which necessitates long term oral anticoagulation to prevent stroke.  Unfortunately, She is not felt to be a long term Warfarin candidate secondary to recent acute GI bleeding.  The patients chart has been reviewed and I feel that they would be a candidate for short term oral anticoagulation.  Procedural risks for the Watchman implant have been reviewed with the patient including a 1% risk of stroke, 2% risk of perforation, 0.1%  risk of device embolization.  Given the patient's poor candidacy for long-term oral anticoagulation and ability to  tolerate short term oral anticoagulation I have recommended the watchman left atrial appendage closure system.   Plan follow-up with me in 6 months.  Erika Nose, MD, [facility], FACP  [place]  [facility]  Medical Director of the Advanced Lipid Disorders &  Cardiovascular Risk Reduction Clinic Diplomate of the American Board of Clinical Lipidology Attending Cardiologist  Direct Dial: [phone]  Fax: [phone]  Website:  [email or web address]  Erika [name] 03/03/2023, 10:37 AM

## 2023-03-19 ENCOUNTER — Telehealth: Payer: Medicare Other | Admitting: Family Medicine

## 2023-03-19 DIAGNOSIS — U071 COVID-19: Secondary | ICD-10-CM | POA: Diagnosis not present

## 2023-03-19 MED ORDER — NIRMATRELVIR/RITONAVIR (PAXLOVID)TABLET: 3.0000 | ORAL_TABLET | Freq: Two times a day (BID) | ORAL | 0 refills | Status: AC

## 2023-03-19 MED ORDER — BENZONATATE 200 MG PO CAPS: 200.0000 mg | ORAL_CAPSULE | Freq: Two times a day (BID) | ORAL | 0 refills | Status: DC | PRN

## 2023-03-19 NOTE — Progress Notes (Signed)
 Virtual Visit Consent   Erika [name], you are scheduled for a virtual visit with a [facility] provider today. Just as with appointments in the office, your consent must be obtained to participate. Your consent will be active for this visit and any virtual visit you may have with one of our providers in the next 365 days. If you have a MyChart account, a copy of this consent can be sent to you electronically.  As this is a virtual visit, video technology does not allow for your provider to perform a traditional examination. This may limit your provider's ability to fully assess your condition. If your provider identifies any concerns that need to be evaluated in person or the need to arrange testing (such as labs, EKG, etc.), we will make arrangements to do so. Although advances in technology are sophisticated, we cannot ensure that it will always work on either your end or our end. If the connection with a video visit is poor, the visit may have to be switched to a telephone visit. With either a video or telephone visit, we are not always able to ensure that we have a secure connection.  By engaging in this virtual visit, you consent to the provision of healthcare and authorize for your insurance to be billed (if applicable) for the services provided during this visit. Depending on your insurance coverage, you may receive a charge related to this service.  I need to obtain your verbal consent now. Are you willing to proceed with your visit today? Erika [name] has provided verbal consent on 03/19/2023 for a virtual visit (video or telephone). Loa Lamp, FNP  Date: 03/19/2023 1:58 PM  Virtual Visit via Video Note   I, Loa Lamp, connected with  Erika [name]  ([id number], [birth year]) on 03/19/23 at  2:00 PM EST by a video-enabled telemedicine application and verified that I am speaking with the correct person using two identifiers.  Location: Patient: Virtual Visit Location Patient:  Home Provider: Virtual Visit Location Provider: Home Office   I discussed the limitations of evaluation and management by telemedicine and the availability of in person appointments. The patient expressed understanding and agreed to proceed.    History of Present Illness: Erika [name] is a 68 y.o. who identifies as a female who was assigned female at birth, and is being seen today for sore throat, cough, fever, chills, body aches, no asthma or copd. Denies renal problems.SABRA  HPI: HPI  Problems:  Patient Active Problem List   Diagnosis Date Noted   Acute bleeding 02/15/2023   Monoallelic mutation of CALR3 gene 07/09/2022   Encounter for coordination of complex care 12/03/2021   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   Diabetic peripheral neuropathy associated with type 2 diabetes mellitus (HCC) 03/26/2021   Atrial fibrillation (HCC) 10/23/2019   Cystocele 02/06/2014   Osteoarthritis of right knee 04/10/2013   Knee osteoarthritis 04/10/2013   Hyperlipidemia 03/23/2013   Diabetes mellitus (HCC) 03/23/2013   Abnormal EKG     Allergies:  Allergies  Allergen Reactions   Amoxicillin     Only one case of HIVES.  Has taken it in the past.   Fluticasone Propionate     Other reaction(s): foot cramps-mild   Nsaids Nausea And Vomiting   Tetanus Toxoids Other (See Comments)    Very high fever   Tramadol  Hcl     Other reaction(s): agitated   Medications:  Current Outpatient Medications:    ALPRAZolam  (XANAX ) 0.25 MG tablet, Take 0.25 mg by  mouth at bedtime as needed., Disp: , Rfl:    ARTIFICIAL TEARS 0.1-0.3 % SOLN, Place 2 drops into both eyes daily as needed (for dry eye)., Disp: , Rfl:    Calcium  Carb-Cholecalciferol 600-800 MG-UNIT TABS, Take 1 tablet by mouth 2 (two) times daily., Disp: , Rfl:    cetirizine (ZYRTEC) 10 MG tablet, Take 10 mg by mouth daily., Disp: , Rfl:    DILT-XR 120 MG 24 hr capsule, Take 120 mg by mouth daily., Disp: , Rfl:    Dulaglutide  (TRULICITY ) 3 MG/0.5ML SOPN, Inject  3 mg into the skin once a week., Disp: 2 mL, Rfl: 2   escitalopram  (LEXAPRO ) 10 MG tablet, Take 1 tablet by mouth daily., Disp: , Rfl:    ferrous sulfate  325 (65 FE) MG EC tablet, Take 1 tablet (325 mg total) by mouth every other day., Disp: 60 tablet, Rfl: 0   FREESTYLE LITE test strip, 1 each by Other route as needed., Disp: , Rfl:    insulin  glargine, 1 Unit Dial , (TOUJEO  SOLOSTAR) 300 UNIT/ML Solostar Pen, Inject 40 Units into the skin daily., Disp: , Rfl:    metFORMIN  (GLUCOPHAGE -XR) 500 MG 24 hr tablet, Take 1,000 mg by mouth 2 (two) times daily., Disp: , Rfl:    montelukast  (SINGULAIR ) 10 MG tablet, Take 10 mg by mouth daily., Disp: , Rfl:    Omega-3 Fatty Acids (FISH OIL PO), Take 1 tablet by mouth daily., Disp: , Rfl:    pantoprazole  (PROTONIX ) 40 MG tablet, Take 1 tablet (40 mg total) by mouth daily., Disp: 30 tablet, Rfl: 1   Potassium Gluconate 550 MG TABS, Take 2 tablets by mouth every evening. , Disp: , Rfl:    rosuvastatin  (CRESTOR ) 5 MG tablet, Take 5 mg by mouth daily., Disp: , Rfl:   Observations/Objective: Patient is well-developed, well-nourished in no acute distress.  Resting comfortably  at home.  Head is normocephalic, atraumatic.  No labored breathing.  Speech is clear and coherent with logical content.  Patient is alert and oriented at baseline.    Assessment and Plan: 1. COVID (Primary)  Increase fluids, humidifier at night, tylenol  or ibuprofen as directed, UC if sx worsen, quarantine discussed, MVI with C, D, zinc.   Follow Up Instructions: I discussed the assessment and treatment plan with the patient. The patient was provided an opportunity to ask questions and all were answered. The patient agreed with the plan and demonstrated an understanding of the instructions.  A copy of instructions were sent to the patient via MyChart unless otherwise noted below.     The patient was advised to call back or seek an in-person evaluation if the symptoms worsen or if  the condition fails to improve as anticipated.    Chelsia Serres, FNP

## 2023-03-19 NOTE — Patient Instructions (Signed)

## 2023-03-27 ENCOUNTER — Other Ambulatory Visit: Payer: Self-pay

## 2023-03-27 ENCOUNTER — Telehealth: Payer: Medicare Other

## 2023-03-27 ENCOUNTER — Emergency Department (HOSPITAL_COMMUNITY): Payer: Medicare Other

## 2023-03-27 ENCOUNTER — Observation Stay (HOSPITAL_COMMUNITY)
Admission: EM | Admit: 2023-03-27 | Discharge: 2023-03-28 | Disposition: A | Discharge status: Home / Self Care | Payer: Medicare Other | Attending: Internal Medicine | Admitting: Internal Medicine

## 2023-03-27 ENCOUNTER — Encounter (HOSPITAL_COMMUNITY): Payer: Self-pay | Admitting: Internal Medicine

## 2023-03-27 DIAGNOSIS — E785 Hyperlipidemia, unspecified: Secondary | ICD-10-CM | POA: Diagnosis not present

## 2023-03-27 DIAGNOSIS — Z7901 Long term (current) use of anticoagulants: Secondary | ICD-10-CM | POA: Insufficient documentation

## 2023-03-27 DIAGNOSIS — I480 Paroxysmal atrial fibrillation: Principal | ICD-10-CM | POA: Insufficient documentation

## 2023-03-27 DIAGNOSIS — K529 Noninfective gastroenteritis and colitis, unspecified: Secondary | ICD-10-CM | POA: Insufficient documentation

## 2023-03-27 DIAGNOSIS — E1140 Type 2 diabetes mellitus with diabetic neuropathy, unspecified: Secondary | ICD-10-CM | POA: Insufficient documentation

## 2023-03-27 DIAGNOSIS — D3502 Benign neoplasm of left adrenal gland: Secondary | ICD-10-CM | POA: Insufficient documentation

## 2023-03-27 DIAGNOSIS — F419 Anxiety disorder, unspecified: Secondary | ICD-10-CM | POA: Insufficient documentation

## 2023-03-27 DIAGNOSIS — E1165 Type 2 diabetes mellitus with hyperglycemia: Secondary | ICD-10-CM | POA: Insufficient documentation

## 2023-03-27 DIAGNOSIS — Z7984 Long term (current) use of oral hypoglycemic drugs: Secondary | ICD-10-CM | POA: Insufficient documentation

## 2023-03-27 DIAGNOSIS — I10 Essential (primary) hypertension: Secondary | ICD-10-CM | POA: Insufficient documentation

## 2023-03-27 DIAGNOSIS — D702 Other drug-induced agranulocytosis: Secondary | ICD-10-CM | POA: Insufficient documentation

## 2023-03-27 DIAGNOSIS — Z79899 Other long term (current) drug therapy: Secondary | ICD-10-CM | POA: Diagnosis not present

## 2023-03-27 DIAGNOSIS — A419 Sepsis, unspecified organism: Secondary | ICD-10-CM | POA: Insufficient documentation

## 2023-03-27 DIAGNOSIS — E278 Other specified disorders of adrenal gland: Secondary | ICD-10-CM | POA: Diagnosis present

## 2023-03-27 DIAGNOSIS — I4891 Unspecified atrial fibrillation: Principal | ICD-10-CM | POA: Diagnosis present

## 2023-03-27 DIAGNOSIS — F32A Depression, unspecified: Secondary | ICD-10-CM | POA: Diagnosis not present

## 2023-03-27 DIAGNOSIS — E782 Mixed hyperlipidemia: Secondary | ICD-10-CM

## 2023-03-27 DIAGNOSIS — E119 Type 2 diabetes mellitus without complications: Secondary | ICD-10-CM

## 2023-03-27 DIAGNOSIS — D72828 Other elevated white blood cell count: Secondary | ICD-10-CM | POA: Diagnosis present

## 2023-03-27 DIAGNOSIS — K219 Gastro-esophageal reflux disease without esophagitis: Secondary | ICD-10-CM | POA: Insufficient documentation

## 2023-03-27 DIAGNOSIS — D500 Iron deficiency anemia secondary to blood loss (chronic): Secondary | ICD-10-CM | POA: Insufficient documentation

## 2023-03-27 DIAGNOSIS — Z96651 Presence of right artificial knee joint: Secondary | ICD-10-CM | POA: Insufficient documentation

## 2023-03-27 DIAGNOSIS — E860 Dehydration: Secondary | ICD-10-CM | POA: Insufficient documentation

## 2023-03-27 DIAGNOSIS — R1030 Lower abdominal pain, unspecified: Secondary | ICD-10-CM

## 2023-03-27 HISTORY — DX: Sleep apnea, unspecified: G47.30

## 2023-03-27 LAB — COMPREHENSIVE METABOLIC PANEL
ALT: 28 U/L (ref 0–44)
AST: 20 U/L (ref 15–41)
Albumin: 4 g/dL (ref 3.5–5.0)
Alkaline Phosphatase: 77 U/L (ref 38–126)
Anion gap: 13 (ref 5–15)
BUN: 24 mg/dL — ABNORMAL HIGH (ref 8–23)
CO2: 21 mmol/L — ABNORMAL LOW (ref 22–32)
Calcium: 9.8 mg/dL (ref 8.9–10.3)
Chloride: 99 mmol/L (ref 98–111)
Creatinine, Ser: 0.78 mg/dL (ref 0.44–1.00)
GFR, Estimated: >60 mL/min (ref >60)
Glucose, Bld: 399 mg/dL — ABNORMAL HIGH (ref 70–99)
Potassium: 3.9 mmol/L (ref 3.5–5.1)
Sodium: 133 mmol/L — ABNORMAL LOW (ref 135–145)
Total Bilirubin: 0.9 mg/dL (ref 0.0–1.2)
Total Protein: 7.7 g/dL (ref 6.5–8.1)

## 2023-03-27 LAB — PROTIME-INR
INR: 1.2 (ref 0.8–1.2)
Prothrombin Time: 14.9 s (ref 11.4–15.2)

## 2023-03-27 LAB — CBC
HCT: 38 % (ref 36.0–46.0)
Hemoglobin: 11.9 g/dL — ABNORMAL LOW (ref 12.0–15.0)
MCH: 26.9 pg (ref 26.0–34.0)
MCHC: 31.3 g/dL (ref 30.0–36.0)
MCV: 85.8 fL (ref 80.0–100.0)
Platelets: 166 10*3/uL (ref 150–400)
RBC: 4.43 MIL/uL (ref 3.87–5.11)
RDW: 16.4 % — ABNORMAL HIGH (ref 11.5–15.5)
WBC: 22.2 10*3/uL — ABNORMAL HIGH (ref 4.0–10.5)
nRBC: 0 % (ref 0.0–0.2)

## 2023-03-27 LAB — HEMOGLOBIN A1C
Hgb A1c MFr Bld: 8.7 % — ABNORMAL HIGH (ref 4.8–5.6)
Mean Plasma Glucose: 202.99 mg/dL

## 2023-03-27 LAB — CBG MONITORING, ED: Glucose-Capillary: 323 mg/dL — ABNORMAL HIGH (ref 70–99)

## 2023-03-27 LAB — LIPASE, BLOOD: Lipase: 26 U/L (ref 11–51)

## 2023-03-27 LAB — GLUCOSE, CAPILLARY: Glucose-Capillary: 186 mg/dL — ABNORMAL HIGH (ref 70–99)

## 2023-03-27 LAB — MAGNESIUM: Magnesium: 1.5 mg/dL — ABNORMAL LOW (ref 1.7–2.4)

## 2023-03-27 LAB — I-STAT CG4 LACTIC ACID, ED: Lactic Acid, Venous: 1 mmol/L (ref 0.5–1.9)

## 2023-03-27 MED ORDER — BOOST / RESOURCE BREEZE PO LIQD CUSTOM
1.0000 | Freq: Three times a day (TID) | ORAL | Status: DC
Start: 2023-03-28 — End: 2023-03-28

## 2023-03-27 MED ORDER — HYDROCODONE-ACETAMINOPHEN 5-325 MG PO TABS: 1.0000 | ORAL_TABLET | ORAL | Status: DC | PRN

## 2023-03-27 MED ORDER — INSULIN ASPART 100 UNIT/ML IJ SOLN
8.0000 [IU] | Freq: Once | INTRAMUSCULAR | Status: AC
  Administered 2023-03-27: 8 [IU] via SUBCUTANEOUS
  Filled 2023-03-27: qty 0.08

## 2023-03-27 MED ORDER — PANTOPRAZOLE SODIUM 40 MG PO TBEC
40.0000 mg | DELAYED_RELEASE_TABLET | Freq: Every day | ORAL | Status: DC
  Administered 2023-03-27 – 2023-03-28 (×2): 40 mg via ORAL
  Filled 2023-03-27 (×2): qty 1

## 2023-03-27 MED ORDER — ACETAMINOPHEN 650 MG RE SUPP: 650.0000 mg | Freq: Four times a day (QID) | RECTAL | Status: DC | PRN

## 2023-03-27 MED ORDER — METRONIDAZOLE 500 MG/100ML IV SOLN
500.0000 mg | Freq: Two times a day (BID) | INTRAVENOUS | Status: DC
  Administered 2023-03-28: 500 mg via INTRAVENOUS
  Filled 2023-03-27: qty 100

## 2023-03-27 MED ORDER — INSULIN GLARGINE-YFGN 100 UNIT/ML ~~LOC~~ SOLN
30.0000 [IU] | Freq: Every day | SUBCUTANEOUS | Status: DC
  Administered 2023-03-27: 30 [IU] via SUBCUTANEOUS
  Filled 2023-03-27 (×2): qty 0.3

## 2023-03-27 MED ORDER — IOHEXOL 300 MG/ML  SOLN: 100.0000 mL | Freq: Once | INTRAMUSCULAR | Status: DC | PRN

## 2023-03-27 MED ORDER — SODIUM CHLORIDE 0.9 % IV SOLN
1.0000 g | Freq: Once | INTRAVENOUS | Status: AC
  Administered 2023-03-27: 1 g via INTRAVENOUS
  Filled 2023-03-27: qty 10

## 2023-03-27 MED ORDER — LORAZEPAM 2 MG/ML IJ SOLN
1.0000 mg | Freq: Once | INTRAMUSCULAR | Status: AC
  Administered 2023-03-27: 1 mg via INTRAVENOUS
  Filled 2023-03-27: qty 1

## 2023-03-27 MED ORDER — DILTIAZEM HCL-DEXTROSE 125-5 MG/125ML-% IV SOLN (PREMIX)
5.0000 mg/h | INTRAVENOUS | Status: DC
  Administered 2023-03-27: 5 mg/h via INTRAVENOUS
  Administered 2023-03-27: 12.5 mg/h via INTRAVENOUS
  Filled 2023-03-27 (×3): qty 125

## 2023-03-27 MED ORDER — METRONIDAZOLE 500 MG/100ML IV SOLN
500.0000 mg | Freq: Once | INTRAVENOUS | Status: AC
  Administered 2023-03-27: 500 mg via INTRAVENOUS
  Filled 2023-03-27: qty 100

## 2023-03-27 MED ORDER — ALPRAZOLAM 0.25 MG PO TABS
0.2500 mg | ORAL_TABLET | Freq: Every evening | ORAL | Status: DC | PRN
  Administered 2023-03-27: 0.25 mg via ORAL
  Filled 2023-03-27: qty 1

## 2023-03-27 MED ORDER — MAGNESIUM SULFATE 2 GM/50ML IV SOLN
2.0000 g | Freq: Once | INTRAVENOUS | Status: AC
  Administered 2023-03-27: 2 g via INTRAVENOUS
  Filled 2023-03-27: qty 50

## 2023-03-27 MED ORDER — INSULIN GLARGINE (1 UNIT DIAL) 300 UNIT/ML ~~LOC~~ SOPN: 30.0000 [IU] | PEN_INJECTOR | Freq: Every day | SUBCUTANEOUS | Status: DC

## 2023-03-27 MED ORDER — INSULIN ASPART 100 UNIT/ML IJ SOLN
0.0000 [IU] | INTRAMUSCULAR | Status: DC
  Administered 2023-03-27 – 2023-03-28 (×2): 2 [IU] via SUBCUTANEOUS
  Administered 2023-03-28: 3 [IU] via SUBCUTANEOUS
  Filled 2023-03-27: qty 0.09

## 2023-03-27 MED ORDER — SODIUM CHLORIDE 0.9 % IV SOLN: INTRAVENOUS | Status: DC

## 2023-03-27 MED ORDER — ONDANSETRON HCL 4 MG/2ML IJ SOLN: 4.0000 mg | Freq: Four times a day (QID) | INTRAMUSCULAR | Status: DC | PRN

## 2023-03-27 MED ORDER — DILTIAZEM LOAD VIA INFUSION
15.0000 mg | Freq: Once | INTRAVENOUS | Status: AC
  Administered 2023-03-27: 15 mg via INTRAVENOUS
  Filled 2023-03-27: qty 15

## 2023-03-27 MED ORDER — ACETAMINOPHEN 325 MG PO TABS: 650.0000 mg | ORAL_TABLET | Freq: Four times a day (QID) | ORAL | Status: DC | PRN

## 2023-03-27 MED ORDER — MONTELUKAST SODIUM 10 MG PO TABS
10.0000 mg | ORAL_TABLET | Freq: Every day | ORAL | Status: DC
  Administered 2023-03-27 – 2023-03-28 (×2): 10 mg via ORAL
  Filled 2023-03-27 (×2): qty 1

## 2023-03-27 MED ORDER — IOHEXOL 350 MG/ML SOLN
100.0000 mL | Freq: Once | INTRAVENOUS | Status: AC | PRN
  Administered 2023-03-27: 100 mL via INTRAVENOUS

## 2023-03-27 MED ORDER — FENTANYL CITRATE PF 50 MCG/ML IJ SOSY
50.0000 ug | PREFILLED_SYRINGE | INTRAMUSCULAR | Status: DC | PRN
  Administered 2023-03-27: 50 ug via INTRAVENOUS
  Filled 2023-03-27: qty 1

## 2023-03-27 MED ORDER — ROSUVASTATIN CALCIUM 5 MG PO TABS
5.0000 mg | ORAL_TABLET | Freq: Every day | ORAL | Status: DC
  Administered 2023-03-27 – 2023-03-28 (×2): 5 mg via ORAL
  Filled 2023-03-27 (×2): qty 1

## 2023-03-27 MED ORDER — ONDANSETRON HCL 4 MG PO TABS: 4.0000 mg | ORAL_TABLET | Freq: Four times a day (QID) | ORAL | Status: DC | PRN

## 2023-03-27 MED ORDER — SODIUM CHLORIDE 0.9 % IV SOLN
2.0000 g | INTRAVENOUS | Status: DC
  Administered 2023-03-28: 2 g via INTRAVENOUS
  Filled 2023-03-27: qty 20

## 2023-03-27 NOTE — ED Triage Notes (Addendum)
BIBA from home with lower abdominal pain. Pt has a known abd hernia, Type 2 DM, memory loss. 434 cbg 190/110 BP 80 HR 100% room air

## 2023-03-27 NOTE — ED Notes (Signed)
Patient resting in bed with grand daughter at bedside.

## 2023-03-27 NOTE — Assessment & Plan Note (Signed)
Needs to have further follow-up as an outpatient

## 2023-03-27 NOTE — ED Notes (Signed)
ED TO INPATIENT HANDOFF REPORT  Name/Age/Gender Erika [name] 68 y.o. female  Code Status    Code Status Orders  (From admission, onward)           Start     Ordered   03/27/23 1848  Full code  Continuous       Question:  By:  Answer:  Consent: discussion documented in EHR   03/27/23 1848           Code Status History     Date Active Date Inactive Code Status Order ID Comments User Context   02/15/2023 2323 02/21/2023 1543 Full Code [id number]  Briant Sites, DO ED   11/04/2022 1007 11/05/2022 0503 Full Code [id number]  Bennie Dallas, MD HOV   02/06/2014 1101 02/07/2014 1410 Full Code [id number]  Lavina Hamman, MD Inpatient   04/10/2013 1058 04/11/2013 1600 Full Code [id number]  Eulas Post, MD Inpatient       Home/SNF/Other Home  Chief Complaint Atrial fibrillation with RVR (HCC) [I48.91]  Level of Care/Admitting Diagnosis ED Disposition     ED Disposition  Admit   Condition  --   Comment  Hospital Area: [facility] [[id number]]  Level of Care: Progressive [102]  Admit to Progressive based on following criteria: GI, ENDOCRINE disease patients with GI bleeding, acute liver failure or pancreatitis, stable with diabetic ketoacidosis or thyrotoxicosis (hypothyroid) state.  Admit to Progressive based on following criteria: CARDIOVASCULAR & THORACIC of moderate stability with acute coronary syndrome symptoms/low risk myocardial infarction/hypertensive urgency/arrhythmias/heart failure potentially compromising stability and stable post cardiovascular intervention patients.  May admit patient to [facility] or [facility] if equivalent level of care is available:: No  Covid Evaluation: Asymptomatic - no recent exposure (last 10 days) testing not required  Diagnosis: Atrial fibrillation with RVR [facility]) [[id number]]  Admitting Physician: Therisa Doyne [[id number]]  Attending Physician: Therisa Doyne [[id number]]  Certification:: I certify this patient will  need inpatient services for at least 2 midnights  Expected Medical Readiness: 03/29/2023          Medical History Past Medical History:  Diagnosis Date   Anemia    "just related to fibroids"   Anxiety    takes Clonazepam daily as needed   Arthritis    "wrists; neck" (04/10/2013)   Basal cell carcinoma of nasal tip    Cataract    bilateral   Cramping of feet    takes Potassium daily   Dysphagia    History of colon polyps    Hyperlipidemia    takes Crestor nightly "because I'm a diabetic"   Hypertension    Insomnia    takes Benadryl nightly   Joint pain    Joint swelling    Neuromuscular disorder (HCC)    feet   Neuropathy    both feet   Osteoarthritis of right knee 04/10/2013   Seasonal allergies    takes Omnaris and Claritin daily as needed   SVD (spontaneous vaginal delivery)    x 1   Type II diabetes mellitus (HCC)    takes Janumet daily   Urinary frequency     Allergies Allergies  Allergen Reactions   Amoxicillin     Only one case of HIVES.  Has taken it in the past.   Amoxicillin-Pot Clavulanate Hives   Fluticasone Propionate     Other reaction(s): foot cramps-mild   Nsaids Nausea And Vomiting   Sulfa Antibiotics Nausea And Vomiting   Tetanus Toxoids Other (See Comments)  Very high fever   Tramadol Hcl     Other reaction(s): agitated    IV Location/Drains/Wounds Patient Lines/Drains/Airways Status     Active Line/Drains/Airways     Name Placement date Placement time Site Days   Peripheral IV 03/27/23 20 G Left;Lateral Forearm 03/27/23  1321  Forearm  less than 1   Peripheral IV 03/27/23 20 G Anterior;Distal;Right;Upper Arm 03/27/23  1532  Arm  less than 1            Labs/Imaging Results for orders placed or performed during the hospital encounter of 03/27/23 (from the past 48 hours)  Lipase, blood     Status: None   Collection Time: 03/27/23  1:31 PM  Result Value Ref Range   Lipase 26 11 - 51 U/L    Comment: Performed at [facility], [address] [place], [place] [address]  Comprehensive metabolic panel     Status: Abnormal   Collection Time: 03/27/23  1:31 PM  Result Value Ref Range   Sodium 133 (L) 135 - 145 mmol/L   Potassium 3.9 3.5 - 5.1 mmol/L   Chloride 99 98 - 111 mmol/L   CO2 21 (L) 22 - 32 mmol/L   Glucose, Bld 399 (H) 70 - 99 mg/dL    Comment: Glucose reference range applies only to samples taken after fasting for at least 8 hours.   BUN 24 (H) 8 - 23 mg/dL   Creatinine, Ser 6.57 0.44 - 1.00 mg/dL   Calcium 9.8 8.9 - 84.6 mg/dL   Total Protein 7.7 6.5 - 8.1 g/dL   Albumin 4.0 3.5 - 5.0 g/dL   AST 20 15 - 41 U/L   ALT 28 0 - 44 U/L   Alkaline Phosphatase 77 38 - 126 U/L   Total Bilirubin 0.9 0.0 - 1.2 mg/dL   GFR, Estimated >96 >29 mL/min    Comment: (NOTE) Calculated using the CKD-EPI Creatinine Equation (2021)    Anion gap 13 5 - 15    Comment: Performed at [facility], [address] [place], [place] [address]  CBC     Status: Abnormal   Collection Time: 03/27/23  1:31 PM  Result Value Ref Range   WBC 22.2 (H) 4.0 - 10.5 K/uL   RBC 4.43 3.87 - 5.11 MIL/uL   Hemoglobin 11.9 (L) 12.0 - 15.0 g/dL   HCT 32.4 40.1 - 02.7 %   MCV 85.8 80.0 - 100.0 fL   MCH 26.9 26.0 - 34.0 pg   MCHC 31.3 30.0 - 36.0 g/dL   RDW 25.3 (H) 66.4 - 40.3 %   Platelets 166 150 - 400 K/uL   nRBC 0.0 0.0 - 0.2 %    Comment: Performed at [facility], [address], [place], [place] [address]  Magnesium     Status: Abnormal   Collection Time: 03/27/23  2:30 PM  Result Value Ref Range   Magnesium 1.5 (L) 1.7 - 2.4 mg/dL    Comment: Performed at [facility], [address], [place], [place] [address]  CBG monitoring, ED     Status: Abnormal   Collection Time: 03/27/23  4:37 PM  Result Value Ref Range   Glucose-Capillary 323 (H) 70 - 99 mg/dL    Comment: Glucose reference range applies only to samples taken after fasting for at least 8  hours.  I-Stat CG4 Lactic Acid     Status: None   Collection Time: 03/27/23  6:24 PM  Result Value Ref Range   Lactic Acid, Venous 1.0 0.5 - 1.9 mmol/L   CT Angio Chest PE W and/or Wo Contrast Result Date: 03/27/2023 CLINICAL DATA:  Pulmonary embolism (PE) suspected, high prob EXAM: CT ANGIOGRAPHY CHEST WITH CONTRAST TECHNIQUE: Multidetector CT imaging of the chest was performed using the standard protocol during bolus administration of intravenous contrast. Multiplanar CT image reconstructions and MIPs were obtained to evaluate the vascular anatomy. RADIATION DOSE REDUCTION: This exam was performed according to the departmental dose-optimization program which includes automated exposure control, adjustment of the mA and/or kV according to patient size and/or use of iterative reconstruction technique. CONTRAST:  OMNIPAQUE IOHEXOL 350 MG/ML SOLN COMPARISON:  X-ray 02/15/2023 FINDINGS: Cardiovascular: Satisfactory opacification of the pulmonary arteries to the segmental level. No evidence of pulmonary embolism. Pulmonary trunk measures 3.5 cm in diameter. Thoracic aorta is nonaneurysmal. Scattered atherosclerotic vascular calcifications of the aorta and coronary arteries. Mild cardiomegaly. No pericardial effusion. Mediastinum/Nodes: No enlarged mediastinal, hilar, or axillary lymph nodes. Thyroid gland, trachea, and esophagus demonstrate no significant findings. Lungs/Pleura: Minimal bibasilar subsegmental atelectasis. Lungs are otherwise clear. No pleural effusion or pneumothorax. Upper Abdomen: See concurrently obtained abdominopelvic CT. Musculoskeletal: No chest wall abnormality. No acute or significant osseous findings. Review of the MIP images confirms the above findings. IMPRESSION: 1. No evidence of pulmonary embolism or other acute intrathoracic process. 2. Mild cardiomegaly with enlargement of the pulmonary trunk suggesting pulmonary arterial hypertension. 3. Aortic and coronary artery  atherosclerosis (ICD10-I70.0). Electronically Signed   By: Duanne Guess D.O.   On: 03/27/2023 17:03   CT ABDOMEN PELVIS W CONTRAST Result Date: 03/27/2023 CLINICAL DATA:  Abdominal pain, acute, nonlocalized EXAM: CT ABDOMEN AND PELVIS WITH CONTRAST TECHNIQUE: Multidetector CT imaging of the abdomen and pelvis was performed using the standard protocol following bolus administration of intravenous contrast. RADIATION DOSE REDUCTION: This exam was performed according to the departmental dose-optimization program which includes automated exposure control, adjustment of the mA and/or kV according to patient size and/or use of iterative reconstruction technique. CONTRAST:  OMNIPAQUE IOHEXOL 350 MG/ML SOLN COMPARISON:  None Available. FINDINGS: Lower chest: See concurrently obtained CT chest. Hepatobiliary: No focal liver abnormality is identified. Partially contracted gallbladder. No hyperdense gallstone or pericholecystic inflammatory changes by CT. No biliary dilatation. Pancreas: Pancreatic atrophy with a mildly prominent and slightly beaded appearance of the pancreatic duct. No peripancreatic inflammatory changes. Spleen: Normal in size without focal abnormality. Adrenals/Urinary Tract: Mixed attenuation left adrenal mass measuring up to 3.5 cm without macroscopic fat or calcification. Normal right adrenal gland. Kidneys enhance symmetrically. No renal lesion, stone, or hydronephrosis. Ureters and urinary bladder within normal limits. Stomach/Bowel: Stomach within normal limits. Long segment mild small-bowel wall thickening. Adjacent mesenteric edema within the right lower quadrant. The appendix is not definitively visualized. Minimal scattered colonic diverticulosis. No focal colonic wall thickening. No evidence of bowel obstruction. Vascular/Lymphatic: Aortic atherosclerosis. No enlarged abdominal or pelvic lymph nodes. Reproductive: Status post hysterectomy. No adnexal masses. Other: Small volume free  fluid within the pelvis. No organized abdominopelvic fluid collection. No pneumoperitoneum. Small right inguinal hernia containing a small amount of fluid. Musculoskeletal: No acute or significant osseous findings. Advanced degenerative changes of both hips. IMPRESSION: 1. Long segment mild small-bowel wall thickening with adjacent mesenteric edema within the right lower quadrant. Findings are suggestive of an infectious or inflammatory enteritis. 2. Small volume free fluid within the pelvis, likely reactive. 3. Mixed attenuation left adrenal mass measuring up to 3.5 cm. Recommend further evaluation with nonemergent  adrenal protocol CT or MRI. 4. Appearance of the pancreas favors sequela of chronic pancreatitis. 5. Small right inguinal hernia containing a small amount of fluid. 6. Aortic atherosclerosis (ICD10-I70.0). Electronically Signed   By: Duanne Guess D.O.   On: 03/27/2023 16:55    Pending Labs Unresulted Labs (From admission, onward)     Start     Ordered   03/27/23 1857  Protime-INR  Once,   R        03/27/23 1856   03/27/23 1857  Hemoglobin A1c  Once,   R       Comments: To assess prior glycemic control    03/27/23 1856   03/27/23 1823  C Difficile Quick Screen w PCR reflex  (C Difficile quick screen w PCR reflex panel )  Once, for 24 hours,   URGENT       References:    CDiff Information Tool   03/27/23 1822   03/27/23 1743  Blood culture (routine x 2)  BLOOD CULTURE X 2,   R (with STAT occurrences)      03/27/23 1743   03/27/23 1311  Urinalysis, Routine w reflex microscopic -Urine, Clean Catch  Once,   URGENT       Question:  Specimen Source  Answer:  Urine, Clean Catch   03/27/23 1311   Signed and Held  Magnesium  Tomorrow morning,   R        Signed and Held   Signed and Held  Phosphorus  Tomorrow morning,   R        Signed and Held   Signed and Held  Comprehensive metabolic panel  Tomorrow morning,   R       Question:  Release to patient  Answer:  Immediate   Signed and  Held   Signed and Held  CBC  Tomorrow morning,   R       Question:  Release to patient  Answer:  Immediate   Signed and Held            Vitals/Pain Today's Vitals   03/27/23 1530 03/27/23 1718 03/27/23 1815 03/27/23 1900  BP: 117/65  105/61 115/76  Pulse: (!) 104  98 99  Resp: 17  (!) 23 (!) 23  Temp:  98.4 F (36.9 C)    TempSrc:  Oral    SpO2: 98%  94% 99%  PainSc:        Isolation Precautions No active isolations  Medications Medications  diltiazem (CARDIZEM) 1 mg/mL load via infusion 15 mg (15 mg Intravenous Bolus from Bag 03/27/23 1435)    And  diltiazem (CARDIZEM) 125 mg in dextrose 5% 125 mL (1 mg/mL) infusion (5 mg/hr Intravenous New Bag/Given 03/27/23 1438)  fentaNYL (SUBLIMAZE) injection 50 mcg (50 mcg Intravenous Given 03/27/23 1435)  metroNIDAZOLE (FLAGYL) IVPB 500 mg (500 mg Intravenous New Bag/Given 03/27/23 1853)  metroNIDAZOLE (FLAGYL) IVPB 500 mg (has no administration in time range)  cefTRIAXone (ROCEPHIN) 2 g in sodium chloride 0.9 % 100 mL IVPB (has no administration in time range)  insulin aspart (novoLOG) injection 0-9 Units (has no administration in time range)  magnesium sulfate IVPB 2 g 50 mL (0 g Intravenous Stopped 03/27/23 1717)  LORazepam (ATIVAN) injection 1 mg (1 mg Intravenous Given 03/27/23 1539)  iohexol (OMNIPAQUE) 350 MG/ML injection 100 mL (100 mLs Intravenous Contrast Given 03/27/23 1609)  insulin aspart (novoLOG) injection 8 Units (8 Units Subcutaneous Given 03/27/23 1637)  cefTRIAXone (ROCEPHIN) 1 g in sodium chloride 0.9 % 100  mL IVPB (0 g Intravenous Stopped 03/27/23 1845)    Mobility non-ambulatory

## 2023-03-27 NOTE — Assessment & Plan Note (Signed)
-   will replace electrolytes and repeat  check Mg, phos and Ca level and replace as needed Monitor on telemetry   Lab Results  Component Value Date   K 3.9 03/27/2023     Lab Results  Component Value Date   CREATININE 0.78 03/27/2023   Lab Results  Component Value Date   MG 1.5 (L) 03/27/2023   Lab Results  Component Value Date   CALCIUM 9.8 03/27/2023   PHOS 4.3 02/21/2023

## 2023-03-27 NOTE — Assessment & Plan Note (Signed)
Continue Crestor 5 mg po q day

## 2023-03-27 NOTE — ED Provider Notes (Signed)
  Physical Exam  BP 117/65   Pulse (!) 104   Temp 98.4 F (36.9 C) (Oral)   Resp 17   SpO2 98%   Physical Exam  Procedures  Procedures  ED Course / MDM    Medical Decision Making Care assumed at 3 PM.  Patient is here with rapid A-fib as well as lower abdominal pain.  Patient's white count is 22,000.  Patient is in rapid A-fib and started on Cardizem drip.  Patient signed out pending CT abdomen pelvis and admission.  6:24 PM CT showed colitis.  Given leukocytosis, I started patient on Rocephin and Flagyl.  C. difficile was ordered.  Patient's heart rate is in the low 100s on Cardizem drip.  At this point patient will be admitted for rapid A-fib, sepsis from colitis.  CRITICAL CARE Performed by: Richardean Canal   Total critical care time: 37 minutes  Critical care time was exclusive of separately billable procedures and treating other patients.  Critical care was necessary to treat or prevent imminent or life-threatening deterioration.  Critical care was time spent personally by me on the following activities: development of treatment plan with patient and/or surrogate as well as nursing, discussions with consultants, evaluation of patient's response to treatment, examination of patient, obtaining history from patient or surrogate, ordering and performing treatments and interventions, ordering and review of laboratory studies, ordering and review of radiographic studies, pulse oximetry and re-evaluation of patient's condition.   Problems Addressed: Atrial fibrillation with RVR (HCC): acute illness or injury Colitis: acute illness or injury Lower abdominal pain: acute illness or injury Sepsis, due to unspecified organism, unspecified whether acute organ dysfunction present [facility]): acute illness or injury  Amount and/or Complexity of Data Reviewed Labs: ordered. Decision-making details documented in ED Course. Radiology: ordered and independent interpretation performed.  Decision-making details documented in ED Course. ECG/medicine tests: ordered and independent interpretation performed. Decision-making details documented in ED Course.  Risk Prescription drug management. Decision regarding hospitalization.          Charlynne Pander, MD 03/27/23 [phone]

## 2023-03-27 NOTE — Assessment & Plan Note (Signed)
Chronic followed by hematology

## 2023-03-27 NOTE — Subjective & Objective (Signed)
Comes from home with lower abd pain  Bp 190/110  Recent admit for GI bleed and symptomatic anemia needing blood transfusion Now reports lower abdominal pain , normal BM's Pt skipped her dose of diltiazem

## 2023-03-27 NOTE — Assessment & Plan Note (Signed)
Will rehydrate and follow fluid status °

## 2023-03-27 NOTE — Assessment & Plan Note (Addendum)
-   Admit to step down on Cardizem drip       CHA2D-VASC score >3      not a candidate for anticoagulation due to risks of  bleeding Has been off of anticoagulation for some time plan for Watchman procedure once stable      Check TSH       Obtain ECHO      Cardiology consult in AM if difficult to control HR Resume p.o. Cardizem when able

## 2023-03-27 NOTE — ED Notes (Signed)
Staff attempted to collect urine sample patient missed bedpan, RN informed family and patient that UA is still needed, clear understanding voiced by patient and family.

## 2023-03-27 NOTE — H&P (Signed)
Erika [name]:[id number] DOB: 20-Mar-1955 DOA: 03/27/2023     PCP: Collene Mares, PA   Outpatient Specialists:  CARDS:   Dr. Chrystie Nose, MD   Patient arrived to ER on 03/27/23 at 1259 Referred by Attending Therisa Doyne, MD   Patient coming from:    home Lives   With family  Chief Complaint:   Chief Complaint  Patient presents with   Abdominal Pain    HPI: Erika [name] is a 68 y.o. female with medical history significant of a,fib not an AC, gi bleed, HTN, HLD    Presented with  abd pain Comes from home with lower abd pain  Bp 190/110  Recent admit for GI bleed and symptomatic anemia needing blood transfusion Now reports lower abdominal pain , normal BM's Pt skipped her dose of diltiazem    Source of bleeding suspected to be polypectomy from recent colonoscopy. She was advised to discontinue her Eliquis at that time. Plan was to resume after 10 days.  But patient later on was told not to resume and plan was for her to undergo Watchman procedure unfortunately patient developed COVID and just recently has recovered Denies any fevers or chills.  Denies significant ETOH intake   Does not smoke   No results found for: "SARSCOV2NAA"    Regarding pertinent Chronic problems:    Hyperlipidemia - on statins  crestor       last echo  Recent Results (from the past 95621 hours)  ECHOCARDIOGRAM COMPLETE   Collection Time: 11/02/19  3:33 PM  Result Value   S' Lateral 3.00   Narrative      ECHOCARDIOGRAM REPORT      IMPRESSIONS    1. Left ventricular ejection fraction, by estimation, is 60 to 65%. The left ventricle has normal function. The left ventricle demonstrates regional wall motion abnormalities (see scoring diagram/findings for description). Left ventricular diastolic  parameters are indeterminate.  2. Right ventricular systolic function is normal. The right ventricular size is normal. Tricuspid regurgitation signal is inadequate for assessing PA  pressure.  3. Left atrial size was severely dilated.  4. The mitral valve is normal in structure. Trivial mitral valve regurgitation. No evidence of mitral stenosis.  5. The aortic valve is tricuspid. Aortic valve regurgitation is not visualized. No aortic stenosis is present.  6. The inferior vena cava is normal in size with greater than 50% respiratory variability, suggesting right atrial pressure of 3 mmHg.  7. The patient was in atrial fibrillation.             DM 2 -  Lab Results  Component Value Date   HGBA1C 8.3 (H) 02/15/2023   on insulin, PO meds       A. Fib -   atrial fibrillation CHA2DS2 vas score    4    Not on anticoagulation secondary to  recurrent bleeding         -  Rate control:  Currently controlled with  Diltiazem,               Chronic anemia - baseline hg Hemoglobin & Hematocrit  Recent Labs    02/20/23 1500 02/21/23 0551 03/27/23 1331  HGB 8.7* 8.3* 11.9*     While in ER:     Lab Orders         Blood culture (routine x 2)         C Difficile Quick Screen w PCR reflex  Lipase, blood         Comprehensive metabolic panel         CBC         Urinalysis, Routine w reflex microscopic -Urine, Clean Catch         Magnesium         CBG monitoring, ED         I-Stat CG4 Lactic Acid      CTabd/pelvis -  Long segment mild small-bowel wall thickening with adjacent mesenteric edema within the right lower quadrant. Findings are suggestive of an infectious or inflammatory enteritis.  Mixed attenuation left adrenal mass measuring up to 3.5 cm. Recommend further evaluation with nonemergent adrenal protocol CT or MRI. CTA chest -  nonacute, no PE,  no evidence of infiltrate but Mild cardiomegaly with enlargement of the pulmonary trunk suggesting pulmonary arterial hypertension.  Following Medications were ordered in ER: Medications  diltiazem (CARDIZEM) 1 mg/mL load via infusion 15 mg (15 mg Intravenous Bolus from Bag 03/27/23 1435)    And   diltiazem (CARDIZEM) 125 mg in dextrose 5% 125 mL (1 mg/mL) infusion (5 mg/hr Intravenous New Bag/Given 03/27/23 1438)  fentaNYL (SUBLIMAZE) injection 50 mcg (50 mcg Intravenous Given 03/27/23 1435)  cefTRIAXone (ROCEPHIN) 1 g in sodium chloride 0.9 % 100 mL IVPB (1 g Intravenous New Bag/Given 03/27/23 1811)  metroNIDAZOLE (FLAGYL) IVPB 500 mg (has no administration in time range)  magnesium sulfate IVPB 2 g 50 mL (0 g Intravenous Stopped 03/27/23 1717)  LORazepam (ATIVAN) injection 1 mg (1 mg Intravenous Given 03/27/23 1539)  iohexol (OMNIPAQUE) 350 MG/ML injection 100 mL (100 mLs Intravenous Contrast Given 03/27/23 1609)  insulin aspart (novoLOG) injection 8 Units (8 Units Subcutaneous Given 03/27/23 1637)       ED Triage Vitals  Encounter Vitals Group     BP 03/27/23 1308 125/89     Systolic BP Percentile --      Diastolic BP Percentile --      Pulse Rate 03/27/23 1308 76     Resp 03/27/23 1308 16     Temp 03/27/23 1308 98.2 F (36.8 C)     Temp Source 03/27/23 1308 Oral     SpO2 03/27/23 1308 100 %     Weight --      Height --      Head Circumference --      Peak Flow --      Pain Score 03/27/23 1314 10     Pain Loc --      Pain Education --      Exclude from Growth Chart --   AVWU(98)@     _________________________________________ Significant initial  Findings: Abnormal Labs Reviewed  COMPREHENSIVE METABOLIC PANEL - Abnormal; Notable for the following components:      Result Value   Sodium 133 (*)    CO2 21 (*)    Glucose, Bld 399 (*)    BUN 24 (*)    All other components within normal limits  CBC - Abnormal; Notable for the following components:   WBC 22.2 (*)    Hemoglobin 11.9 (*)    RDW 16.4 (*)    All other components within normal limits  MAGNESIUM - Abnormal; Notable for the following components:   Magnesium 1.5 (*)    All other components within normal limits  CBG MONITORING, ED - Abnormal; Notable for the following components:   Glucose-Capillary 323 (*)     All other components within normal limits  ECG: Ordered Personally reviewed and interpreted by me showing: HR : 150 Rhythm: Atrial fibrillation with rapid V-rate Ventricular premature complex Low voltage, precordial leads ST depression, probably rate related QTC 474    The recent clinical data is shown below. Vitals:   03/27/23 1415 03/27/23 1530 03/27/23 1718 03/27/23 1815  BP: 136/73 117/65  105/61  Pulse: (!) 128 (!) 104  98  Resp: 16 17  (!) 23  Temp:   98.4 F (36.9 C)   TempSrc:   Oral   SpO2: 99% 98%  94%    WBC     Component Value Date/Time   WBC 22.2 (H) 03/27/2023 1331   LYMPHSABS 2.7 02/19/2023 0659   MONOABS 1.5 (H) 02/19/2023 0659   EOSABS 0.2 02/19/2023 0659   BASOSABS 0.1 02/19/2023 0659    Lactic Acid, Venous    Component Value Date/Time   LATICACIDVEN 1.0 03/27/2023 1824      ABX started Antibiotics Given (last 72 hours)     Date/Time Action Medication Dose Rate   03/27/23 1811 New Bag/Given   cefTRIAXone (ROCEPHIN) 1 g in sodium chloride 0.9 % 100 mL IVPB 1 g 200 mL/hr       __________________________________________________ Recent Labs  Lab 03/27/23 1331 03/27/23 1430  NA 133*  --   K 3.9  --   CO2 21*  --   GLUCOSE 399*  --   BUN 24*  --   CREATININE 0.78  --   CALCIUM 9.8  --   MG  --  1.5*    Cr   stable  Lab Results  Component Value Date   CREATININE 0.78 03/27/2023   CREATININE 0.50 02/21/2023   CREATININE 0.66 02/20/2023    Recent Labs  Lab 03/27/23 1331  AST 20  ALT 28  ALKPHOS 77  BILITOT 0.9  PROT 7.7  ALBUMIN 4.0   Lab Results  Component Value Date   CALCIUM 9.8 03/27/2023   PHOS 4.3 02/21/2023    Plt: Lab Results  Component Value Date   PLT 166 03/27/2023      Recent Labs  Lab 03/27/23 1331  WBC 22.2*  HGB 11.9*  HCT 38.0  MCV 85.8  PLT 166    HG/HCT  stable,     Component Value Date/Time   HGB 11.9 (L) 03/27/2023 1331   HGB 14.7 10/08/2022 1245   HCT 38.0 03/27/2023 1331   MCV  85.8 03/27/2023 1331    Recent Labs  Lab 03/27/23 1331  LIPASE 26    _______________________________________________ Hospitalist was called for admission for   Atrial fibrillation with RVR enteritis.  Dehydration    The following Work up has been ordered so far:  Orders Placed This Encounter  Procedures   Blood culture (routine x 2)   C Difficile Quick Screen w PCR reflex   CT ABDOMEN PELVIS W CONTRAST   CT Angio Chest PE W and/or Wo Contrast   Lipase, blood   Comprehensive metabolic panel   CBC   Urinalysis, Routine w reflex microscopic -Urine, Clean Catch   Magnesium   Diet NPO time specified   ED Cardiac monitoring   Initiate Carrier Fluid Protocol   Cardiac Monitoring - Continuous Indefinite   Consult to hospitalist   CBG monitoring, ED   I-Stat CG4 Lactic Acid   EKG 12-Lead   Insert peripheral IV   Admit to Inpatient (patient's expected length of stay will be greater than 2 midnights or inpatient only procedure)     OTHER  Significant initial  Findings:  labs showing:     DM  labs:  HbA1C: Recent Labs    02/15/23 2336  HGBA1C 8.3*    CBG (last 3)  Recent Labs    03/27/23 1637  GLUCAP 323*          Cultures: No results found for: "SDES", "SPECREQUEST", "CULT", "REPTSTATUS"   Radiological Exams on Admission: CT Angio Chest PE W and/or Wo Contrast Result Date: 03/27/2023 CLINICAL DATA:  Pulmonary embolism (PE) suspected, high prob EXAM: CT ANGIOGRAPHY CHEST WITH CONTRAST TECHNIQUE: Multidetector CT imaging of the chest was performed using the standard protocol during bolus administration of intravenous contrast. Multiplanar CT image reconstructions and MIPs were obtained to evaluate the vascular anatomy. RADIATION DOSE REDUCTION: This exam was performed according to the departmental dose-optimization program which includes automated exposure control, adjustment of the mA and/or kV according to patient size and/or use of iterative reconstruction technique.  CONTRAST:  OMNIPAQUE IOHEXOL 350 MG/ML SOLN COMPARISON:  X-ray 02/15/2023 FINDINGS: Cardiovascular: Satisfactory opacification of the pulmonary arteries to the segmental level. No evidence of pulmonary embolism. Pulmonary trunk measures 3.5 cm in diameter. Thoracic aorta is nonaneurysmal. Scattered atherosclerotic vascular calcifications of the aorta and coronary arteries. Mild cardiomegaly. No pericardial effusion. Mediastinum/Nodes: No enlarged mediastinal, hilar, or axillary lymph nodes. Thyroid gland, trachea, and esophagus demonstrate no significant findings. Lungs/Pleura: Minimal bibasilar subsegmental atelectasis. Lungs are otherwise clear. No pleural effusion or pneumothorax. Upper Abdomen: See concurrently obtained abdominopelvic CT. Musculoskeletal: No chest wall abnormality. No acute or significant osseous findings. Review of the MIP images confirms the above findings. IMPRESSION: 1. No evidence of pulmonary embolism or other acute intrathoracic process. 2. Mild cardiomegaly with enlargement of the pulmonary trunk suggesting pulmonary arterial hypertension. 3. Aortic and coronary artery atherosclerosis (ICD10-I70.0). Electronically Signed   By: Duanne Guess D.O.   On: 03/27/2023 17:03   CT ABDOMEN PELVIS W CONTRAST Result Date: 03/27/2023 CLINICAL DATA:  Abdominal pain, acute, nonlocalized EXAM: CT ABDOMEN AND PELVIS WITH CONTRAST TECHNIQUE: Multidetector CT imaging of the abdomen and pelvis was performed using the standard protocol following bolus administration of intravenous contrast. RADIATION DOSE REDUCTION: This exam was performed according to the departmental dose-optimization program which includes automated exposure control, adjustment of the mA and/or kV according to patient size and/or use of iterative reconstruction technique. CONTRAST:  OMNIPAQUE IOHEXOL 350 MG/ML SOLN COMPARISON:  None Available. FINDINGS: Lower chest: See concurrently obtained CT chest. Hepatobiliary: No  focal liver abnormality is identified. Partially contracted gallbladder. No hyperdense gallstone or pericholecystic inflammatory changes by CT. No biliary dilatation. Pancreas: Pancreatic atrophy with a mildly prominent and slightly beaded appearance of the pancreatic duct. No peripancreatic inflammatory changes. Spleen: Normal in size without focal abnormality. Adrenals/Urinary Tract: Mixed attenuation left adrenal mass measuring up to 3.5 cm without macroscopic fat or calcification. Normal right adrenal gland. Kidneys enhance symmetrically. No renal lesion, stone, or hydronephrosis. Ureters and urinary bladder within normal limits. Stomach/Bowel: Stomach within normal limits. Long segment mild small-bowel wall thickening. Adjacent mesenteric edema within the right lower quadrant. The appendix is not definitively visualized. Minimal scattered colonic diverticulosis. No focal colonic wall thickening. No evidence of bowel obstruction. Vascular/Lymphatic: Aortic atherosclerosis. No enlarged abdominal or pelvic lymph nodes. Reproductive: Status post hysterectomy. No adnexal masses. Other: Small volume free fluid within the pelvis. No organized abdominopelvic fluid collection. No pneumoperitoneum. Small right inguinal hernia containing a small amount of fluid. Musculoskeletal: No acute or significant osseous findings. Advanced degenerative changes of both hips. IMPRESSION:  1. Long segment mild small-bowel wall thickening with adjacent mesenteric edema within the right lower quadrant. Findings are suggestive of an infectious or inflammatory enteritis. 2. Small volume free fluid within the pelvis, likely reactive. 3. Mixed attenuation left adrenal mass measuring up to 3.5 cm. Recommend further evaluation with nonemergent adrenal protocol CT or MRI. 4. Appearance of the pancreas favors sequela of chronic pancreatitis. 5. Small right inguinal hernia containing a small amount of fluid. 6. Aortic atherosclerosis  (ICD10-I70.0). Electronically Signed   By: Duanne Guess D.O.   On: 03/27/2023 16:55   _______________________________________________________________________________________________________ Latest  Blood pressure 105/61, pulse 98, temperature 98.4 F (36.9 C), temperature source Oral, resp. rate (!) 23, SpO2 94%.   Vitals  labs and radiology finding personally reviewed  Review of Systems:    Pertinent positives include:   fatigue Constitutional:  No weight loss, night sweats, Fevers, chills, , weight loss  HEENT:  No headaches, Difficulty swallowing,Tooth/dental problems,Sore throat,  No sneezing, itching, ear ache, nasal congestion, post nasal drip,  Cardio-vascular:  No chest pain, Orthopnea, PND, anasarca, dizziness, palpitations.no Bilateral lower extremity swelling  GI:  No heartburn, indigestion, abdominal pain, nausea, vomiting, diarrhea, change in bowel habits, loss of appetite, melena, blood in stool, hematemesis Resp:  no shortness of breath at rest. No dyspnea on exertion, No excess mucus, no productive cough, No non-productive cough, No coughing up of blood.No change in color of mucus.No wheezing. Skin:  no rash or lesions. No jaundice GU:  no dysuria, change in color of urine, no urgency or frequency. No straining to urinate.  No flank pain.  Musculoskeletal:  No joint pain or no joint swelling. No decreased range of motion. No back pain.  Psych:  No change in mood or affect. No depression or anxiety. No memory loss.  Neuro: no localizing neurological complaints, no tingling, no weakness, no double vision, no gait abnormality, no slurred speech, no confusion  All systems reviewed and apart from HOPI all are negative _______________________________________________________________________________________________ Past Medical History:   Past Medical History:  Diagnosis Date   Anemia    "just related to fibroids"   Anxiety    takes Clonazepam daily as needed    Arthritis    "wrists; neck" (04/10/2013)   Basal cell carcinoma of nasal tip    Cataract    bilateral   Cramping of feet    takes Potassium daily   Dysphagia    History of colon polyps    Hyperlipidemia    takes Crestor nightly "because I'm a diabetic"   Hypertension    Insomnia    takes Benadryl nightly   Joint pain    Joint swelling    Neuromuscular disorder (HCC)    feet   Neuropathy    both feet   Osteoarthritis of right knee 04/10/2013   Seasonal allergies    takes Omnaris and Claritin daily as needed   SVD (spontaneous vaginal delivery)    x 1   Type II diabetes mellitus (HCC)    takes Janumet daily   Urinary frequency       Past Surgical History:  Procedure Laterality Date   ANTERIOR AND POSTERIOR REPAIR N/A 02/06/2014   Procedure: ANTERIOR (CYSTOCELE) AND POSTERIOR REPAIR (RECTOCELE);  Surgeon: Lavina Hamman, MD;  Location: [facility];  Service: Gynecology;  Laterality: N/A;   BONE SPURS Left 1970's   "ankle"   CLOSED REDUCTION NASAL FRACTURE  ~ 2012   COLONOSCOPY     HYSTEROSCOPY WITH RESECTOSCOPE  2004   "  w/fibroids removed"   JOINT REPLACEMENT     right knee   MELANOMA EXCISION  2014   "tip of nose"   TONSILLECTOMY  ~ 1959   TOTAL KNEE ARTHROPLASTY Right 04/10/2013   TOTAL KNEE ARTHROPLASTY Right 04/10/2013   Procedure: TOTAL KNEE ARTHROPLASTY;  Surgeon: Eulas Post, MD;  Location: [facility];  Service: Orthopedics;  Laterality: Right;   VAGINAL HYSTERECTOMY N/A 02/06/2014   Procedure: HYSTERECTOMY VAGINAL;  Surgeon: Lavina Hamman, MD;  Location: [facility];  Service: Gynecology;  Laterality: N/A;    Social History:  Ambulatory   independently     reports that she has never smoked. She has never used smokeless tobacco. She reports current alcohol use of about 3.0 standard drinks of alcohol per week. She reports that she does not use drugs.     Family History:   Family History  Problem Relation Age of Onset   Bronchitis Mother    Liver cancer Father     Diabetes Sister    ______________________________________________________________________________________________ Allergies: Allergies  Allergen Reactions   Amoxicillin     Only one case of HIVES.  Has taken it in the past.   Amoxicillin-Pot Clavulanate Hives   Fluticasone Propionate     Other reaction(s): foot cramps-mild   Nsaids Nausea And Vomiting   Sulfa Antibiotics Nausea And Vomiting   Tetanus Toxoids Other (See Comments)    Very high fever   Tramadol Hcl     Other reaction(s): agitated     Prior to Admission medications   Medication Sig Start Date End Date Taking? Authorizing Provider  ALPRAZolam (XANAX) 0.25 MG tablet Take 0.25 mg by mouth at bedtime as needed. 09/10/19   [provider]  ARTIFICIAL TEARS 0.1-0.3 % SOLN Place 2 drops into both eyes daily as needed (for dry eye).    [provider]  benzonatate (TESSALON) 200 MG capsule Take 1 capsule (200 mg total) by mouth 2 (two) times daily as needed for cough. 03/19/23   Delorse Lek, FNP  Calcium Carb-Cholecalciferol 600-800 MG-UNIT TABS Take 1 tablet by mouth 2 (two) times daily.    [provider]  cetirizine (ZYRTEC) 10 MG tablet Take 10 mg by mouth daily.    [provider]  DILT-XR 120 MG 24 hr capsule Take 120 mg by mouth daily. 10/10/19   [provider]  Dulaglutide (TRULICITY) 3 MG/0.5ML SOPN Inject 3 mg into the skin once a week. 06/07/22     escitalopram (LEXAPRO) 10 MG tablet Take 1 tablet by mouth daily. 10/21/19   [provider]  ferrous sulfate 325 (65 FE) MG EC tablet Take 1 tablet (325 mg total) by mouth every other day. 02/21/23 02/21/24  Wouk, Wilfred Curtis, MD  FREESTYLE LITE test strip 1 each by Other route as needed. 08/21/19   [provider]  insulin glargine, 1 Unit Dial, (TOUJEO SOLOSTAR) 300 UNIT/ML Solostar Pen Inject 40 Units into the skin daily.    [provider]  metFORMIN (GLUCOPHAGE-XR) 500 MG 24 hr tablet Take 1,000 mg by  mouth 2 (two) times daily. 10/07/19   [provider]  montelukast (SINGULAIR) 10 MG tablet Take 10 mg by mouth daily. 08/20/19   [provider]  Omega-3 Fatty Acids (FISH OIL PO) Take 1 tablet by mouth daily.    [provider]  pantoprazole (PROTONIX) 40 MG tablet Take 1 tablet (40 mg total) by mouth daily. 02/21/23   Wouk, Wilfred Curtis, MD  Potassium Gluconate 550 MG TABS  Take 2 tablets by mouth every evening.     [provider]  rosuvastatin (CRESTOR) 5 MG tablet Take 5 mg by mouth daily.    [provider]    ___________________________________________________________________________________________________ Physical Exam:    03/27/2023    6:15 PM 03/27/2023    3:30 PM 03/27/2023    2:15 PM  Vitals with BMI  Systolic 105 117 102  Diastolic 61 65 73  Pulse 98 104 128    1. General:  in No  Acute distress   Chronically ill -appearing 2. Psychological: Alert and   Oriented 3. Head/ENT:   Dry Mucous Membranes                          Head Non traumatic, neck supple                      Poor Dentition 4. SKIN:  decreased Skin turgor,  Skin clean Dry and intact no rash    5. Heart: Regular rate and rhythm no  Murmur, no Rub or gallop 6. Lungs no wheezes or crackles   7. Abdomen: Soft,  non-tender, Non distended   bowel sounds present 8. Lower extremities: no clubbing, cyanosis, no  edema 9. Neurologically Grossly intact, moving all 4 extremities equally   10. MSK: Normal range of motion    Chart has been reviewed  ______________________________________________________________________________________________  Assessment/Plan 68 y.o. female with medical history significant of a,fib not an AC, gi bleed, HTN, HLD    Admitted for   Atrial fibrillation with RVR, enteritis    Present on Admission:  Atrial fibrillation with RVR (HCC)  Hyperlipidemia  Enteritis  Hypomagnesemia  Granulocytosis  Left adrenal mass (HCC)  Dehydration     Atrial fibrillation with RVR (HCC)  - Admit to step down on Cardizem drip       CHA2D-VASC score >3      not a candidate for anticoagulation due to risks of  bleeding Has been off of anticoagulation for some time plan for Watchman procedure once stable      Check TSH       Obtain ECHO      Cardiology consult in AM if difficult to control HR Resume p.o. Cardizem when able    Diabetes mellitus (HCC)  - Order Sensitive SSI   - continue home insulin but decreased to  30units,  -  check TSH and HgA1C  - Hold by mouth medications    Hyperlipidemia Continue Crestor 5 m gpo q day  Enteritis Order gastric panel C.dif Cont rocephin and flagyl  rehydrate  Hypomagnesemia - will replace electrolytes and repeat  check Mg, phos and Ca level and replace as needed Monitor on telemetry   Lab Results  Component Value Date   K 3.9 03/27/2023     Lab Results  Component Value Date   CREATININE 0.78 03/27/2023   Lab Results  Component Value Date   MG 1.5 (L) 03/27/2023   Lab Results  Component Value Date   CALCIUM 9.8 03/27/2023   PHOS 4.3 02/21/2023     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  DVT prophylaxis:  SCD      Code Status:    Code Status: Prior FULL CODE  as per patient  I had personally discussed CODE STATUS with patient and family  ACP   none   Family Communication:   Family   at  Bedside  plan of care was discussed  with   Daughter, Sister,    Diet  Diet Orders (From admission, onward)     Start     Ordered   03/27/23 1311  Diet NPO time specified  Diet effective now        03/27/23 1311            Disposition Plan:   To home once workup is complete and patient is stable   Following barriers for discharge:                                                        Electrolytes corrected                                                           Pain controlled with PO medications                             able to transition to PO antibiotics                             Will need to be able to tolerate PO                                   Consult Orders  (From admission, onward)           Start     Ordered   03/27/23 1744  Consult to hospitalist  Once       Provider:  (Not yet assigned)  Question Answer Comment  Place call to: [facility]   Reason for Consult Admit      03/27/23 1743                               Would benefit from PT/OT eval prior to DC  Ordered                  Consults called: none   Admission status:  ED Disposition     ED Disposition  Admit   Condition  --   Comment  Hospital Area: [facility] [[id number]]  Level of Care: Progressive [102]  Admit to Progressive based on following criteria: GI, ENDOCRINE disease patients with GI bleeding, acute liver failure or pancreatitis, stable with diabetic ketoacidosis or thyrotoxicosis (hypothyroid) state.  Admit to Progressive based on following criteria: CARDIOVASCULAR & THORACIC of moderate stability with acute coronary syndrome symptoms/low risk myocardial infarction/hypertensive urgency/arrhythmias/heart failure potentially compromising stability and stable post cardiovascular intervention patients.  May admit patient to [facility] or [facility] if equivalent level of care is available:: No  Covid Evaluation: Asymptomatic - no recent exposure (last 10 days) testing not required  Diagnosis: Atrial fibrillation with RVR (HCC) [161096]  Admitting Physician: Therisa Doyne [[id number]]  Attending Physician: Therisa Doyne [[id number]]  Certification:: I certify this patient will need inpatient services for at least 2 midnights  Expected Medical Readiness: 03/29/2023            inpatient     I Expect 2 midnight stay secondary to  severity of patient's current illness need for inpatient interventions justified by the following:  hemodynamic instability despite optimal treatment (tachycardia  )   Severe lab/radiological/exam abnormalities including:   Enteritis dehydration A-fib with RVR and extensive comorbidities including:  DM2     That are currently affecting medical management.   I expect  patient to be hospitalized for 2 midnights requiring inpatient medical care.  Patient is at high risk for adverse outcome (such as loss of life or disability) if not treated.  Indication for inpatient stay as follows:    severe pain requiring acute inpatient management,  inability to maintain oral hydration     Need for IV antibiotics, IV fluids    Level of care     progressive      Duron Meister 03/27/2023, 7:25 PM    [facility]     after 2 AM please page floor coverage PA If 7AM-7PM, please contact the day team taking care of the patient using [email or web address]

## 2023-03-27 NOTE — ED Provider Notes (Signed)
[place] EMERGENCY DEPARTMENT AT [facility] Provider Note   CSN: [id number] Arrival date & time: 03/27/23  1259     History  Chief Complaint  Patient presents with   Abdominal Pain    Erika [name] is a 68 y.o. female.   Abdominal Pain Patient is for lower abdominal pain.  Medical history includes DM, arthritis, atrial fibrillation, GI bleeding, anxiety, cystocele.  She was admitted a month ago for symptomatic anemia secondary to GI bleed.  She got blood transfusion at the time.  Source of bleeding suspected to be polypectomy from recent colonoscopy.  She was advised to discontinue her Eliquis at that time.  Plan was to resume after 10 days.  Patient reports that she never resumed her Eliquis.  She does take diltiazem daily but missed her dose this morning.  She states that she has a history of right-sided inguinal hernia.  Today, she developed lower abdominal pain.  She noticed a firm swelling in this area which she states is new.  Pain progressed to the point that she had difficulty walking.  She denies any recent urinary symptoms.  Bowel movements have been normal.  She has undergone hysterectomy in the past.  She denies any recent vaginal bleeding or dark stools.     Home Medications Prior to Admission medications   Medication Sig Start Date End Date Taking? Authorizing Provider  ALPRAZolam (XANAX) 0.25 MG tablet Take 0.25 mg by mouth at bedtime as needed. 09/10/19   [provider]  ARTIFICIAL TEARS 0.1-0.3 % SOLN Place 2 drops into both eyes daily as needed (for dry eye).    [provider]  benzonatate (TESSALON) 200 MG capsule Take 1 capsule (200 mg total) by mouth 2 (two) times daily as needed for cough. 03/19/23   Delorse Lek, FNP  Calcium Carb-Cholecalciferol 600-800 MG-UNIT TABS Take 1 tablet by mouth 2 (two) times daily.    [provider]  cetirizine (ZYRTEC) 10 MG tablet Take 10 mg by mouth daily.    [provider]   DILT-XR 120 MG 24 hr capsule Take 120 mg by mouth daily. 10/10/19   [provider]  Dulaglutide (TRULICITY) 3 MG/0.5ML SOPN Inject 3 mg into the skin once a week. 06/07/22     escitalopram (LEXAPRO) 10 MG tablet Take 1 tablet by mouth daily. 10/21/19   [provider]  ferrous sulfate 325 (65 FE) MG EC tablet Take 1 tablet (325 mg total) by mouth every other day. 02/21/23 02/21/24  Wouk, Wilfred Curtis, MD  FREESTYLE LITE test strip 1 each by Other route as needed. 08/21/19   [provider]  insulin glargine, 1 Unit Dial, (TOUJEO SOLOSTAR) 300 UNIT/ML Solostar Pen Inject 40 Units into the skin daily.    [provider]  metFORMIN (GLUCOPHAGE-XR) 500 MG 24 hr tablet Take 1,000 mg by mouth 2 (two) times daily. 10/07/19   [provider]  montelukast (SINGULAIR) 10 MG tablet Take 10 mg by mouth daily. 08/20/19   [provider]  Omega-3 Fatty Acids (FISH OIL PO) Take 1 tablet by mouth daily.    [provider]  pantoprazole (PROTONIX) 40 MG tablet Take 1 tablet (40 mg total) by mouth daily. 02/21/23   Wouk, Wilfred Curtis, MD  Potassium Gluconate 550 MG TABS Take 2 tablets by mouth every evening.     [provider]  rosuvastatin (CRESTOR) 5 MG tablet Take 5 mg by mouth daily.    [provider]  Allergies    Amoxicillin, Amoxicillin-pot clavulanate, Fluticasone propionate, Nsaids, Sulfa antibiotics, Tetanus toxoids, and Tramadol hcl    Review of Systems   Review of Systems  Gastrointestinal:  Positive for abdominal distention and abdominal pain.  All other systems reviewed and are negative.   Physical Exam Updated Vital Signs BP 117/65   Pulse (!) 104   Temp 98.2 F (36.8 C) (Oral)   Resp 17   SpO2 98%  Physical Exam Vitals and nursing note reviewed.  Constitutional:      General: She is not in acute distress.    Appearance: She is well-developed. She is not ill-appearing, toxic-appearing or diaphoretic.   HENT:     Head: Normocephalic and atraumatic.     Mouth/Throat:     Mouth: Mucous membranes are moist.  Eyes:     Extraocular Movements: Extraocular movements intact.     Conjunctiva/sclera: Conjunctivae normal.  Cardiovascular:     Rate and Rhythm: Tachycardia present. Rhythm irregular.     Heart sounds: No murmur heard. Pulmonary:     Effort: Pulmonary effort is normal. No respiratory distress.  Abdominal:     General: There is distension.     Palpations: Abdomen is soft.     Tenderness: There is abdominal tenderness in the suprapubic area. There is no guarding or rebound.  Musculoskeletal:        General: No swelling.     Cervical back: Neck supple.  Skin:    General: Skin is warm and dry.     Coloration: Skin is not cyanotic, jaundiced or pale.  Neurological:     General: No focal deficit present.     Mental Status: She is alert and oriented to person, place, and time.  Psychiatric:        Mood and Affect: Mood normal.        Behavior: Behavior normal.     ED Results / Procedures / Treatments   Labs (all labs ordered are listed, but only abnormal results are displayed) Labs Reviewed  COMPREHENSIVE METABOLIC PANEL - Abnormal; Notable for the following components:      Result Value   Sodium 133 (*)    CO2 21 (*)    Glucose, Bld 399 (*)    BUN 24 (*)    All other components within normal limits  CBC - Abnormal; Notable for the following components:   WBC 22.2 (*)    Hemoglobin 11.9 (*)    RDW 16.4 (*)    All other components within normal limits  MAGNESIUM - Abnormal; Notable for the following components:   Magnesium 1.5 (*)    All other components within normal limits  LIPASE, BLOOD  URINALYSIS, ROUTINE W REFLEX MICROSCOPIC    EKG EKG Interpretation Date/Time:  Sunday March 27 2023 13:49:42 EST Ventricular Rate:  150 PR Interval:    QRS Duration:  82 QT Interval:  300 QTC Calculation: 474 R Axis:   46  Text Interpretation: Atrial fibrillation  with rapid V-rate Ventricular premature complex Low voltage, precordial leads ST depression, probably rate related Confirmed by Vanetta Mulders [phone]) on 03/27/2023 2:05:34 PM  Radiology No results found.  Procedures Procedures    Medications Ordered in ED Medications  diltiazem (CARDIZEM) 1 mg/mL load via infusion 15 mg (15 mg Intravenous Bolus from Bag 03/27/23 1435)    And  diltiazem (CARDIZEM) 125 mg in dextrose 5% 125 mL (1 mg/mL) infusion (5 mg/hr Intravenous New Bag/Given 03/27/23 1438)  fentaNYL (SUBLIMAZE) injection 50 mcg (50 mcg  Intravenous Given 03/27/23 1435)  magnesium sulfate IVPB 2 g 50 mL (2 g Intravenous New Bag/Given 03/27/23 1539)  iohexol (OMNIPAQUE) 350 MG/ML injection 100 mL (has no administration in time range)  insulin aspart (novoLOG) injection 8 Units (has no administration in time range)  LORazepam (ATIVAN) injection 1 mg (1 mg Intravenous Given 03/27/23 1539)    ED Course/ Medical Decision Making/ A&P                                 Medical Decision Making Amount and/or Complexity of Data Reviewed Labs: ordered. Radiology: ordered.  Risk Prescription drug management.   This patient presents to the ED for concern of abdominal pain, this involves an extensive number of treatment options, and is a complaint that carries with it a high risk of complications and morbidity.  The differential diagnosis includes constipation, colitis, hernia, neoplasm, bowel obstruction   Co morbidities that complicate the patient evaluation  DM, arthritis, atrial fibrillation, GI bleeding, anxiety, cystocele   Additional history obtained:  Additional history obtained from patient's daughter External records from outside source obtained and reviewed including EMR   Lab Tests:  I Ordered, and personally interpreted labs.  The pertinent results include: Initial lab work notable for large leukocytosis.  Hemoglobin has improved from her prior hospitalization.  Kidney  function is preserved.  Hypomagnesemia is present with otherwise normal electrolytes.  Hyperglycemia is present without evidence of DKA.   Imaging Studies ordered:  I ordered imaging studies including CT of abdomen and pelvis I independently visualized and interpreted imaging which showed (pending at time of signout) I agree with the radiologist interpretation   Cardiac Monitoring: / EKG:  The patient was maintained on a cardiac monitor.  I personally viewed and interpreted the cardiac monitored which showed an underlying rhythm of: Atrial fibrillation  Problem List / ED Course / Critical interventions / Medication management  Patient presenting for lower abdominal pain and distention, starting today.  On arrival in the ED, patient found to have atrial fibrillation with RVR.  She does have a history of the same.  She did miss her dose of diltiazem this morning.  Diltiazem bolus and gtt. ordered.  On exam, she does have a firm swelling in suprapubic area.  This area is tender.  Lab work and CT imaging ordered.  Initial lab work notable for leukocytosis and hyperglycemia.  Hemoglobin has improved since her hospitalization a month ago.  Will rule out intra-abdominal bleeding prior to initiation of heparin.  Initial lab work notable for leukocytosis, hypomagnesemia, and hyperglycemia.  Replacing magnesium and insulin were ordered.  Patient had improvement in tachycardia following initiation of diltiazem.  Patient was admitted for further management. I ordered medication including fentanyl for analgesia; diltiazem for rate control; magnesium sulfate for hypomagnesemia; insulin for hyperglycemia Reevaluation of the patient after these medicines showed that the patient improved I have reviewed the patients home medicines and have made adjustments as needed   Social Determinants of Health:  Has PCP  CRITICAL CARE Performed by: Gloris Manchester   Total critical care time: 32 minutes  Critical care  time was exclusive of separately billable procedures and treating other patients.  Critical care was necessary to treat or prevent imminent or life-threatening deterioration.  Critical care was time spent personally by me on the following activities: development of treatment plan with patient and/or surrogate as well as nursing, discussions with consultants, evaluation of patient's  response to treatment, examination of patient, obtaining history from patient or surrogate, ordering and performing treatments and interventions, ordering and review of laboratory studies, ordering and review of radiographic studies, pulse oximetry and re-evaluation of patient's condition.         Final Clinical Impression(s) / ED Diagnoses Final diagnoses:  Atrial fibrillation with RVR (HCC)  Lower abdominal pain    Rx / DC Orders ED Discharge Orders     None         Gloris Manchester, MD 03/27/23 [phone]

## 2023-03-27 NOTE — ED Notes (Signed)
Per day shift patient needs to be put on bedpan and not on bedside commode or walking alone to the restroom due to her being weak when standing on her own.

## 2023-03-27 NOTE — Assessment & Plan Note (Signed)
-   Order Sensitive  SSI   - continue home insulin but decreased to  30units,  -  check TSH and HgA1C  - Hold by mouth medications

## 2023-03-27 NOTE — ED Notes (Signed)
MD made aware of patient HR. Patient denies cp at this time.

## 2023-03-27 NOTE — Assessment & Plan Note (Signed)
Order gastric panel C.dif Cont rocephin and flagyl  rehydrate

## 2023-03-28 ENCOUNTER — Other Ambulatory Visit (HOSPITAL_COMMUNITY): Payer: Self-pay

## 2023-03-28 DIAGNOSIS — I480 Paroxysmal atrial fibrillation: Secondary | ICD-10-CM | POA: Diagnosis not present

## 2023-03-28 DIAGNOSIS — I4891 Unspecified atrial fibrillation: Secondary | ICD-10-CM | POA: Diagnosis not present

## 2023-03-28 LAB — COMPREHENSIVE METABOLIC PANEL
ALT: 19 U/L (ref 0–44)
AST: 13 U/L — ABNORMAL LOW (ref 15–41)
Albumin: 3 g/dL — ABNORMAL LOW (ref 3.5–5.0)
Alkaline Phosphatase: 54 U/L (ref 38–126)
Anion gap: 9 (ref 5–15)
BUN: 12 mg/dL (ref 8–23)
CO2: 23 mmol/L (ref 22–32)
Calcium: 8.5 mg/dL — ABNORMAL LOW (ref 8.9–10.3)
Chloride: 105 mmol/L (ref 98–111)
Creatinine, Ser: 0.5 mg/dL (ref 0.44–1.00)
GFR, Estimated: >60 mL/min (ref >60)
Glucose, Bld: 163 mg/dL — ABNORMAL HIGH (ref 70–99)
Potassium: 3.3 mmol/L — ABNORMAL LOW (ref 3.5–5.1)
Sodium: 137 mmol/L (ref 135–145)
Total Bilirubin: 0.4 mg/dL (ref 0.0–1.2)
Total Protein: 6.5 g/dL (ref 6.5–8.1)

## 2023-03-28 LAB — GLUCOSE, CAPILLARY
Glucose-Capillary: 141 mg/dL — ABNORMAL HIGH (ref 70–99)
Glucose-Capillary: 149 mg/dL — ABNORMAL HIGH (ref 70–99)
Glucose-Capillary: 191 mg/dL — ABNORMAL HIGH (ref 70–99)
Glucose-Capillary: 216 mg/dL — ABNORMAL HIGH (ref 70–99)

## 2023-03-28 LAB — CBC
HCT: 32.1 % — ABNORMAL LOW (ref 36.0–46.0)
Hemoglobin: 9.7 g/dL — ABNORMAL LOW (ref 12.0–15.0)
MCH: 26.6 pg (ref 26.0–34.0)
MCHC: 30.2 g/dL (ref 30.0–36.0)
MCV: 87.9 fL (ref 80.0–100.0)
Platelets: 154 10*3/uL (ref 150–400)
RBC: 3.65 MIL/uL — ABNORMAL LOW (ref 3.87–5.11)
RDW: 16.7 % — ABNORMAL HIGH (ref 11.5–15.5)
WBC: 16.9 10*3/uL — ABNORMAL HIGH (ref 4.0–10.5)
nRBC: 0 % (ref 0.0–0.2)

## 2023-03-28 LAB — MAGNESIUM: Magnesium: 1.7 mg/dL (ref 1.7–2.4)

## 2023-03-28 LAB — PHOSPHORUS: Phosphorus: 3.4 mg/dL (ref 2.5–4.6)

## 2023-03-28 MED ORDER — SODIUM CHLORIDE 0.9 % IV SOLN: INTRAVENOUS | Status: DC

## 2023-03-28 MED ORDER — POTASSIUM CHLORIDE CRYS ER 20 MEQ PO TBCR
40.0000 meq | EXTENDED_RELEASE_TABLET | ORAL | Status: AC
Start: 2023-03-28 — End: 2023-03-28
  Administered 2023-03-28 (×2): 40 meq via ORAL
  Filled 2023-03-28 (×2): qty 2

## 2023-03-28 MED ORDER — DILTIAZEM HCL ER COATED BEADS 120 MG PO CP24
120.0000 mg | ORAL_CAPSULE | Freq: Every day | ORAL | Status: DC
  Administered 2023-03-28: 120 mg via ORAL
  Filled 2023-03-28 (×2): qty 1

## 2023-03-28 MED ORDER — CIPROFLOXACIN HCL 500 MG PO TABS
500.0000 mg | ORAL_TABLET | Freq: Two times a day (BID) | ORAL | 0 refills | Status: AC
  Filled 2023-03-28: qty 20, 10d supply, fill #0

## 2023-03-28 MED ORDER — METRONIDAZOLE 500 MG PO TABS
500.0000 mg | ORAL_TABLET | Freq: Two times a day (BID) | ORAL | 0 refills | Status: AC
  Filled 2023-03-28: qty 20, 10d supply, fill #0

## 2023-03-28 MED ORDER — MAGNESIUM SULFATE 2 GM/50ML IV SOLN
2.0000 g | Freq: Once | INTRAVENOUS | Status: AC
  Administered 2023-03-28: 2 g via INTRAVENOUS
  Filled 2023-03-28: qty 50

## 2023-03-28 NOTE — TOC Progression Note (Signed)
Transition of Care [facility]) - Progression Note    Patient Details  Name: Erika [name] MRN: [id number] Date of Birth: 1955-07-02  Transition of Care [facility]) CM/SW Contact  Sagrario Lineberry, Olegario Messier, RN Phone Number: 03/28/2023, 4:04 PM  Clinical Narrative: Noted PT to eval. Patient agrees to no preference for HHPT if recc & ordered. [facility] rep Adele Dan accepted.      Expected Discharge Plan: Home/Self Care Barriers to Discharge: No Barriers Identified  Expected Discharge Plan and Services         Expected Discharge Date: 03/28/23                                     Social Determinants of Health (SDOH) Interventions SDOH Screenings   Food Insecurity: No Food Insecurity (03/27/2023)  Housing: Low Risk  (03/27/2023)  Transportation Needs: No Transportation Needs (03/27/2023)  Utilities: Not At Risk (03/27/2023)  Social Connections: Unknown (03/27/2023)  Tobacco Use: Low Risk  (03/27/2023)    Readmission Risk Interventions    02/18/2023    3:10 PM  Readmission Risk Prevention Plan  Post Dischage Appt Complete  Medication Screening Complete  Transportation Screening Complete

## 2023-03-28 NOTE — Progress Notes (Signed)
OT Cancellation Note  Patient Details Name: Erika [name] MRN: [id number] DOB: 1955/08/19   Cancelled Treatment:    Reason Eval/Treat Not Completed: Medical issues which prohibited therapy. Per nursing pt with low BPs and instructs OT to try later. OT will follow up next available time as appropriate  Galen Manila 03/28/2023, 1:33 PM

## 2023-03-28 NOTE — TOC Initial Note (Signed)
Transition of Care [facility]) - Initial/Assessment Note    Patient Details  Name: Erika [name] MRN: [id number] Date of Birth: 10-24-1955  Transition of Care [facility]) CM/SW Contact:    Lanier Clam, RN Phone Number: 03/28/2023, 9:46 AM  Clinical Narrative:   d/c plan home.                Expected Discharge Plan: Home/Self Care Barriers to Discharge: Continued Medical Work up   Patient Goals and CMS Choice Patient states their goals for this hospitalization and ongoing recovery are:: Home CMS Medicare.gov Compare Post Acute Care list provided to:: Patient Choice offered to / list presented to : Patient [place] ownership interest in [facility].provided to:: Patient    Expected Discharge Plan and Services                                              Prior Living Arrangements/Services                       Activities of Daily Living   ADL Screening (condition at time of admission) Independently performs ADLs?: Yes (appropriate for developmental age) Is the patient deaf or have difficulty hearing?: Yes (HOH but does not wear hearing aids) Does the patient have difficulty seeing, even when wearing glasses/contacts?: Yes (Prescription glasses) Does the patient have difficulty concentrating, remembering, or making decisions?: Yes (Has trouble with remembering things situationally.)  Permission Sought/Granted                  Emotional Assessment              Admission diagnosis:  Colitis [K52.9] Lower abdominal pain [R10.30] Atrial fibrillation with RVR (HCC) [I48.91] Sepsis, due to unspecified organism, unspecified whether acute organ dysfunction present [facility]) [A41.9] Patient Active Problem List   Diagnosis Date Noted   Atrial fibrillation with RVR (HCC) 03/27/2023   Enteritis 03/27/2023   Hypomagnesemia 03/27/2023   Left adrenal mass (HCC) 03/27/2023   Dehydration 03/27/2023   Acute bleeding 02/15/2023   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   Encounter for coordination of complex care 12/03/2021   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   Diabetic peripheral neuropathy associated with type 2 diabetes mellitus (HCC) 03/26/2021   Atrial fibrillation (HCC) 10/23/2019   Cystocele 02/06/2014   Osteoarthritis of right knee 04/10/2013   Knee osteoarthritis 04/10/2013   Hyperlipidemia 03/23/2013   Diabetes mellitus (HCC) 03/23/2013   Abnormal EKG    PCP:  Collene Mares, PA Pharmacy:   [facility] [id number] [place], [place] - [address] AT [facility] [address] [facility] [address] [place] [address] Phone: [phone] Fax: [phone]     Social Drivers of Health (SDOH) Social History: SDOH Screenings   Food Insecurity: No Food Insecurity (03/27/2023)  Housing: Low Risk  (03/27/2023)  Transportation Needs: No Transportation Needs (03/27/2023)  Utilities: Not At Risk (03/27/2023)  Social Connections: Unknown (03/27/2023)  Tobacco Use: Low Risk  (03/27/2023)   SDOH Interventions:     Readmission Risk Interventions    02/18/2023    3:10 PM  Readmission Risk Prevention Plan  Post Dischage Appt Complete  Medication Screening Complete  Transportation Screening Complete

## 2023-03-28 NOTE — Care Management CC44 (Signed)
Condition Code 44 Documentation Completed  Patient Details  Name: MARTA BOUIE MRN: [id number] Date of Birth: 1955-08-30   Condition Code 44 given:  Yes Patient signature on Condition Code 44 notice:  Yes Documentation of 2 MD's agreement:  Yes Code 44 added to claim:  Yes    Lanier Clam, RN 03/28/2023, 3:32 PM

## 2023-03-28 NOTE — Discharge Summary (Signed)
Physician Discharge Summary  Erika [name] ZOX:[id number] DOB: Feb 22, 1955 DOA: 03/27/2023  PCP: Collene Mares, PA  Admit date: 03/27/2023 Discharge date: 03/28/2023  Admitted From: Home Disposition: Home  Recommendations for Outpatient Follow-up:  Follow up with PCP in 1-2 weeks Continue antibiotics with ciprofloxacin and Flagyl complete course for gastroenteritis Please obtain BMP/CBC in one week  Home Health: None Equipment/Devices: None  Discharge Condition: Stable CODE STATUS: Full code Diet recommendation: Heart healthy/consistent carb regular diet  History of present illness:  Erika [name] is a 68 year old female with past medical history significant for paroxysmal atrial fibrillation not on anticoagulation due to history of GI bleed, essential hypertension, hyperlipidemia, GERD, type 2 diabetes mellitus, anxiety/depression, granulocytosis who presented to [facility] ED on 2/15 from home via EMS complaining of lower abdominal pain.  Localized to the lower abdomen and which progressed to the point of unable to ambulate with associated nausea.  Did not take her diltiazem due to her symptoms.  Denies any vaginal bleeding, no dark stools.  Recently admitted 1 month ago for symptomatic anemia secondary to GI bleed, received blood transfusion and follow-up bleeding suspected from polypectomy from recent colonoscopy.  Was discontinued from her home Eliquis and has yet to restart.  In the ED, temperature 98.2 F, HR 128, RR 16, BP 125/89, SpO2 100% on room air.  WBC 22.2, hemoglobin 11.9, platelet count 166.  Sodium 133, potassium 3.9, chloride 99, CO2 21, glucose 399, BUN 24, creatinine 0.70.  Magnesium 1.5.  Lipase 26.  AST 20, ALT 28, total bilirubin 0.9.  Lactic acid 1.0.  CT angiogram chest with no evidence of pulmonary embolism, no acute intrathoracic process.  CT abdomen/pelvis with contrast with long segment mild small bowel wall thickening with adjacent mesenteric  edema within the right lower quadrant suggestive of infectious enteritis, mixed intubation the left adrenal mass measuring up to 3.5 cm, outpatient follow-up with nonemergent adrenal protocol CT versus MRI, appearance of the pancreas favors sequelae of chronic pancreatitis, small right inguinal hernia containing a small amount of fluid, aortic atherosclerosis.  In the ED patient was given magnesium replacement, insulin and started on diltiazem drip.  TRH consulted for admission for further evaluation management of atrial fibrillation with RVR, abdominal pain secondary to enteritis.  Hospital course:  Enteritis Patient presenting to ED with nausea, vomiting and lower abdominal pain.  Patient is afebrile with elevated WBC count of 22.2.  CT abdomen/pelvis notable for long segment mild small bowel wall thickening with adjacent mesenteric edema consistent with infectious enteritis.  Patient was started on ceftriaxone and metronidazole.  Was started on a clear liquid diet advance to regular without issue.  Patient will discharge home on ciprofloxacin and metronidazole to complete 10-day course.  Outpatient follow-up with PCP.  Encouraged increased oral intake/hydration.  Paroxysmal atrial fibrillation with RVR Patient presenting to ED with elevated heart rate of 128.  Reports that she missed her dose of diltiazem yesterday due to inability to tolerate due to her abdominal symptoms.  Patient was initially placed on a Cardizem drip, patient was tolerating oral intake and diltiazem was transition back to her home dose of 120 mg p.o. daily.  Heart rate remained stable.  No longer on anticoagulation due to recent history of GI bleed, will defer to PCP/cardiology outpatient on whether to restart.  Essential hypertension Cardizem 120 mg p.o. daily  Hyperlipidemia Crestor 5 mg p.o. daily  Type 2 diabetes mellitus, with hyperglycemia Hemoglobin A1c 8.7.  Metformin 1000 mg p.o. twice  daily, Trulicity once weekly,  Toujeo 40 units subcutaneously daily.  Outpatient follow-up with PCP.  GERD Protonix 40 mg p.o. daily  Iron deficiency anemia Ferrous sulfate 325 mg p.o. daily  Anxiety/depression Continue Lexapro 10 mg p.o. daily, alprazolam 0.25 mg p.o. nightly as needed.  Granulocytosis Continue outpatient follow-up with hematology   Discharge Diagnoses:  Principal Problem:   Atrial fibrillation with RVR (HCC) Active Problems:   Hyperlipidemia   Diabetes mellitus (HCC)   Granulocytosis   Enteritis   Hypomagnesemia   Left adrenal mass (HCC)   Dehydration    Discharge Instructions  Discharge Instructions     Call MD for:  difficulty breathing, headache or visual disturbances   Complete by: As directed    Call MD for:  extreme fatigue   Complete by: As directed    Call MD for:  persistant dizziness or light-headedness   Complete by: As directed    Call MD for:  persistant nausea and vomiting   Complete by: As directed    Call MD for:  severe uncontrolled pain   Complete by: As directed    Call MD for:  temperature >100.4   Complete by: As directed    Diet - low sodium heart healthy   Complete by: As directed    Increase activity slowly   Complete by: As directed       Allergies as of 03/28/2023       Reactions   Amoxicillin Hives   Only one case of HIVES.  Has taken it in the past.   Amoxicillin-pot Clavulanate Hives   Fluticasone Propionate Other (See Comments)   Foot cramps-mild   Nsaids Nausea And Vomiting   Sulfa Antibiotics Nausea And Vomiting   Tetanus Toxoids Other (See Comments)   Very high fever   Tramadol Hcl Other (See Comments)   Agitated        Medication List     STOP taking these medications    Eliquis 5 MG Tabs tablet Generic drug: apixaban       TAKE these medications    ALPRAZolam 0.25 MG tablet Commonly known as: XANAX Take 0.25 mg by mouth at bedtime as needed.   Artificial Tears 0.1-0.3 % Soln Generic drug: Dextran  70-Hypromellose Place 2 drops into both eyes daily as needed (for dry eye).   Calcium Carb-Cholecalciferol 600-800 MG-UNIT Tabs Take 1 tablet by mouth daily.   cetirizine 10 MG tablet Commonly known as: ZYRTEC Take 10 mg by mouth daily.   ciprofloxacin 500 MG tablet Commonly known as: Cipro Take 1 tablet (500 mg total) by mouth 2 (two) times daily for 10 days.   Dilt-XR 120 MG 24 hr capsule Generic drug: diltiazem Take 120 mg by mouth daily.   escitalopram 10 MG tablet Commonly known as: LEXAPRO Take 1 tablet by mouth daily.   ferrous sulfate 325 (65 FE) MG EC tablet Take 1 tablet (325 mg total) by mouth every other day. What changed: when to take this   FISH OIL PO Take 1 tablet by mouth in the morning and at bedtime.   lisinopril 2.5 MG tablet Commonly known as: ZESTRIL Take 2.5 mg by mouth daily.   metFORMIN 500 MG 24 hr tablet Commonly known as: GLUCOPHAGE-XR Take 1,000 mg by mouth 2 (two) times daily.   metroNIDAZOLE 500 MG tablet Commonly known as: Flagyl Take 1 tablet (500 mg total) by mouth 2 (two) times daily for 10 days.   montelukast 10 MG tablet Commonly known as: SINGULAIR  Take 10 mg by mouth daily.   pantoprazole 40 MG tablet Commonly known as: PROTONIX Take 1 tablet (40 mg total) by mouth daily.   Potassium Gluconate 550 MG Tabs Take 550 mg by mouth every evening.   rosuvastatin 5 MG tablet Commonly known as: CRESTOR Take 5 mg by mouth daily.   Toujeo SoloStar 300 UNIT/ML Solostar Pen Generic drug: insulin glargine (1 Unit Dial) Inject 40 Units into the skin every evening.   Trulicity 3 MG/0.5ML Soaj Generic drug: Dulaglutide Inject 3 mg into the skin once a week.        Follow-up Information     [place], [place], [place]. Schedule an appointment as soon as possible for a visit in 1 week(s).   Specialty: Internal Medicine Contact information: [address] [place] [address] [phone]                 Allergies  Allergen Reactions   Amoxicillin Hives    Only one case of HIVES.  Has taken it in the past.   Amoxicillin-Pot Clavulanate Hives   Fluticasone Propionate Other (See Comments)    Foot cramps-mild   Nsaids Nausea And Vomiting   Sulfa Antibiotics Nausea And Vomiting   Tetanus Toxoids Other (See Comments)    Very high fever   Tramadol Hcl Other (See Comments)    Agitated    Consultations: None   Procedures/Studies: CT Angio Chest PE W and/or Wo Contrast Result Date: 03/27/2023 CLINICAL DATA:  Pulmonary embolism (PE) suspected, high prob EXAM: CT ANGIOGRAPHY CHEST WITH CONTRAST TECHNIQUE: Multidetector CT imaging of the chest was performed using the standard protocol during bolus administration of intravenous contrast. Multiplanar CT image reconstructions and MIPs were obtained to evaluate the vascular anatomy. RADIATION DOSE REDUCTION: This exam was performed according to the departmental dose-optimization program which includes automated exposure control, adjustment of the mA and/or kV according to patient size and/or use of iterative reconstruction technique. CONTRAST:  OMNIPAQUE IOHEXOL 350 MG/ML SOLN COMPARISON:  X-ray 02/15/2023 FINDINGS: Cardiovascular: Satisfactory opacification of the pulmonary arteries to the segmental level. No evidence of pulmonary embolism. Pulmonary trunk measures 3.5 cm in diameter. Thoracic aorta is nonaneurysmal. Scattered atherosclerotic vascular calcifications of the aorta and coronary arteries. Mild cardiomegaly. No pericardial effusion. Mediastinum/Nodes: No enlarged mediastinal, hilar, or axillary lymph nodes. Thyroid gland, trachea, and esophagus demonstrate no significant findings. Lungs/Pleura: Minimal bibasilar subsegmental atelectasis. Lungs are otherwise clear. No pleural effusion or pneumothorax. Upper Abdomen: See concurrently obtained abdominopelvic CT. Musculoskeletal: No chest wall abnormality. No acute or significant osseous  findings. Review of the MIP images confirms the above findings. IMPRESSION: 1. No evidence of pulmonary embolism or other acute intrathoracic process. 2. Mild cardiomegaly with enlargement of the pulmonary trunk suggesting pulmonary arterial hypertension. 3. Aortic and coronary artery atherosclerosis (ICD10-I70.0). Electronically Signed   By: Duanne Guess D.O.   On: 03/27/2023 17:03   CT ABDOMEN PELVIS W CONTRAST Result Date: 03/27/2023 CLINICAL DATA:  Abdominal pain, acute, nonlocalized EXAM: CT ABDOMEN AND PELVIS WITH CONTRAST TECHNIQUE: Multidetector CT imaging of the abdomen and pelvis was performed using the standard protocol following bolus administration of intravenous contrast. RADIATION DOSE REDUCTION: This exam was performed according to the departmental dose-optimization program which includes automated exposure control, adjustment of the mA and/or kV according to patient size and/or use of iterative reconstruction technique. CONTRAST:  OMNIPAQUE IOHEXOL 350 MG/ML SOLN COMPARISON:  None Available. FINDINGS: Lower chest: See concurrently obtained CT chest. Hepatobiliary: No focal liver  abnormality is identified. Partially contracted gallbladder. No hyperdense gallstone or pericholecystic inflammatory changes by CT. No biliary dilatation. Pancreas: Pancreatic atrophy with a mildly prominent and slightly beaded appearance of the pancreatic duct. No peripancreatic inflammatory changes. Spleen: Normal in size without focal abnormality. Adrenals/Urinary Tract: Mixed attenuation left adrenal mass measuring up to 3.5 cm without macroscopic fat or calcification. Normal right adrenal gland. Kidneys enhance symmetrically. No renal lesion, stone, or hydronephrosis. Ureters and urinary bladder within normal limits. Stomach/Bowel: Stomach within normal limits. Long segment mild small-bowel wall thickening. Adjacent mesenteric edema within the right lower quadrant. The appendix is not definitively  visualized. Minimal scattered colonic diverticulosis. No focal colonic wall thickening. No evidence of bowel obstruction. Vascular/Lymphatic: Aortic atherosclerosis. No enlarged abdominal or pelvic lymph nodes. Reproductive: Status post hysterectomy. No adnexal masses. Other: Small volume free fluid within the pelvis. No organized abdominopelvic fluid collection. No pneumoperitoneum. Small right inguinal hernia containing a small amount of fluid. Musculoskeletal: No acute or significant osseous findings. Advanced degenerative changes of both hips. IMPRESSION: 1. Long segment mild small-bowel wall thickening with adjacent mesenteric edema within the right lower quadrant. Findings are suggestive of an infectious or inflammatory enteritis. 2. Small volume free fluid within the pelvis, likely reactive. 3. Mixed attenuation left adrenal mass measuring up to 3.5 cm. Recommend further evaluation with nonemergent adrenal protocol CT or MRI. 4. Appearance of the pancreas favors sequela of chronic pancreatitis. 5. Small right inguinal hernia containing a small amount of fluid. 6. Aortic atherosclerosis (ICD10-I70.0). Electronically Signed   By: Duanne Guess D.O.   On: 03/27/2023 16:55     Subjective: Patient seen examined bedside, lying in bed.  Daughter present at bedside.  Transitioned off of Cardizem drip back to her home oral Cardizem 120 mg p.o. daily.  Heart rate remained stable.  Diet advanced to regular with toleration.  Patient desires to discharge home this afternoon.  Discussed with patient to ensure she means for hydration and takes her medications as directed.  Discharging on Cipro/Flagyl for enteritis.  Patient with no other specific complaints, concerns or questions at this time.  Denies headache, no dizziness, no chest pain, no palpitations, no shortness of breath, no current abdominal pain, no fever/chills/night sweats, no nausea/vomiting/diarrhea, no focal weakness, no fatigue, no paresthesias.  No  acute events overnight per nursing staff.  Discharge Exam: Vitals:   03/28/23 1205 03/28/23 1353  BP: (!) 80/48 (!) 99/53  Pulse:  98  Resp:    Temp: 97.8 F (36.6 C) 97.7 F (36.5 C)  SpO2: 100% 98%   Vitals:   03/28/23 0700 03/28/23 0857 03/28/23 1205 03/28/23 1353  BP:  (!) 95/52 (!) 80/48 (!) 99/53  Pulse:  82  98  Resp: 18     Temp:  97.9 F (36.6 C) 97.8 F (36.6 C) 97.7 F (36.5 C)  TempSrc:  Oral Oral Oral  SpO2:  100% 100% 98%  Weight:      Height:        Physical Exam: GEN: NAD, alert and oriented x 3, wd/wn HEENT: NCAT, PERRL, EOMI, sclera clear, MMM PULM: CTAB w/o wheezes/crackles, normal respiratory effort, on room air CV: Irregularly irregular rhythm, normal rate w/o M/G/R GI: abd soft, NTND, NABS, no R/G/M MSK: no peripheral edema, muscle strength globally intact 5/5 bilateral upper/lower extremities NEURO: CN II-XII intact, no focal deficits, sensation to light touch intact PSYCH: normal mood/affect Integumentary: dry/intact, no rashes or wounds    The results of significant diagnostics from this hospitalization (including  imaging, microbiology, ancillary and laboratory) are listed below for reference.     Microbiology: Recent Results (from the past 240 hours)  Blood culture (routine x 2)     Status: None (Preliminary result)   Collection Time: 03/27/23  6:09 PM   Specimen: BLOOD  Result Value Ref Range Status   Specimen Description   Final    BLOOD RIGHT ANTECUBITAL Performed at [facility], [address], [place], [place] [address]    Special Requests   Final    BOTTLES DRAWN AEROBIC AND ANAEROBIC Blood Culture results may not be optimal due to an inadequate volume of blood received in culture bottles Performed at [facility], [address] [place], [place] [address]    Culture   Final    NO GROWTH < 12 HOURS Performed at [facility], [address], [place], [place] [address]    Report  Status PENDING  Incomplete  Blood culture (routine x 2)     Status: None (Preliminary result)   Collection Time: 03/27/23  6:09 PM   Specimen: BLOOD  Result Value Ref Range Status   Specimen Description   Final    BLOOD LEFT ANTECUBITAL Performed at [facility], [address], [place], [place] [address]    Special Requests   Final    BOTTLES DRAWN AEROBIC AND ANAEROBIC Blood Culture results may not be optimal due to an inadequate volume of blood received in culture bottles Performed at [facility], [address], [place], [place] [address]    Culture   Final    NO GROWTH < 12 HOURS Performed at [facility], [address] [place], [place] [address]    Report Status PENDING  Incomplete     Labs: BNP (last 3 results) No results for input(s): "BNP" in the last 8760 hours. Basic Metabolic Panel: Recent Labs  Lab 03/27/23 1331 03/27/23 1430 03/28/23 0543  NA 133*  --  137  K 3.9  --  3.3*  CL 99  --  105  CO2 21*  --  23  GLUCOSE 399*  --  163*  BUN 24*  --  12  CREATININE 0.78  --  0.50  CALCIUM 9.8  --  8.5*  MG  --  1.5* 1.7  PHOS  --   --  3.4   Liver Function Tests: Recent Labs  Lab 03/27/23 1331 03/28/23 0543  AST 20 13*  ALT 28 19  ALKPHOS 77 54  BILITOT 0.9 0.4  PROT 7.7 6.5  ALBUMIN 4.0 3.0*   Recent Labs  Lab 03/27/23 1331  LIPASE 26   No results for input(s): "AMMONIA" in the last 168 hours. CBC: Recent Labs  Lab 03/27/23 1331 03/28/23 0543  WBC 22.2* 16.9*  HGB 11.9* 9.7*  HCT 38.0 32.1*  MCV 85.8 87.9  PLT 166 154   Cardiac Enzymes: No results for input(s): "CKTOTAL", "CKMB", "CKMBINDEX", "TROPONINI" in the last 168 hours. BNP: Invalid input(s): "POCBNP" CBG: Recent Labs  Lab 03/27/23 2100 03/28/23 0018 03/28/23 0415 03/28/23 0739 03/28/23 1121  GLUCAP 186* 191* 141* 149* 216*   D-Dimer No results for input(s): "DDIMER" in the last 72 hours. Hgb A1c Recent Labs     03/27/23 1857  HGBA1C 8.7*   Lipid Profile No results for input(s): "CHOL", "HDL", "LDLCALC", "TRIG", "CHOLHDL", "LDLDIRECT" in the last 72 hours. Thyroid function studies No results for input(s): "TSH", "T4TOTAL", "T3FREE", "THYROIDAB" in the last  72 hours.  Invalid input(s): "FREET3" Anemia work up No results for input(s): "VITAMINB12", "FOLATE", "FERRITIN", "TIBC", "IRON", "RETICCTPCT" in the last 72 hours. Urinalysis    Component Value Date/Time   COLORURINE STRAW (A) 02/15/2023 2240   APPEARANCEUR CLEAR 02/15/2023 2240   LABSPEC 1.024 02/15/2023 2240   PHURINE 5.0 02/15/2023 2240   GLUCOSEU >=500 (A) 02/15/2023 2240   HGBUR MODERATE (A) 02/15/2023 2240   BILIRUBINUR NEGATIVE 02/15/2023 2240   KETONESUR 20 (A) 02/15/2023 2240   PROTEINUR NEGATIVE 02/15/2023 2240   NITRITE NEGATIVE 02/15/2023 2240   LEUKOCYTESUR NEGATIVE 02/15/2023 2240   Sepsis Labs Recent Labs  Lab 03/27/23 1331 03/28/23 0543  WBC 22.2* 16.9*   Microbiology Recent Results (from the past 240 hours)  Blood culture (routine x 2)     Status: None (Preliminary result)   Collection Time: 03/27/23  6:09 PM   Specimen: BLOOD  Result Value Ref Range Status   Specimen Description   Final    BLOOD RIGHT ANTECUBITAL Performed at [facility], [address], [place], [place] [address]    Special Requests   Final    BOTTLES DRAWN AEROBIC AND ANAEROBIC Blood Culture results may not be optimal due to an inadequate volume of blood received in culture bottles Performed at [facility], [address], [place], [place] [address]    Culture   Final    NO GROWTH < 12 HOURS Performed at [facility], [address], [place], [place] [address]    Report Status PENDING  Incomplete  Blood culture (routine x 2)     Status: None (Preliminary result)   Collection Time: 03/27/23  6:09 PM   Specimen: BLOOD  Result Value Ref Range Status   Specimen Description   Final     BLOOD LEFT ANTECUBITAL Performed at [facility], [address], [place], [place] [address]    Special Requests   Final    BOTTLES DRAWN AEROBIC AND ANAEROBIC Blood Culture results may not be optimal due to an inadequate volume of blood received in culture bottles Performed at [facility], [address], [place], [place] [address]    Culture   Final    NO GROWTH < 12 HOURS Performed at [facility], [address] [place], [place] [address]    Report Status PENDING  Incomplete     Time coordinating discharge: Over 30 minutes  SIGNED:   Alvira Philips [place], DO  [facility] 03/28/2023, 3:15 PM

## 2023-03-28 NOTE — Care Management Obs Status (Signed)
MEDICARE OBSERVATION STATUS NOTIFICATION   Patient Details  Name: Erika [name] MRN: [id number] Date of Birth: [birth year]   Medicare Observation Status Notification Given:  Yes    MahabirOlegario Messier, RN 03/28/2023, 3:32 PM

## 2023-03-28 NOTE — Progress Notes (Signed)
   03/28/23 1348  PT Visit Information  Last PT Received On 03/28/23  Reason Eval/Treat Not Completed  (Initial attempt, pt on cardizem drip and PT to return for mobility as pt planned to transition to oral and able to come off monitoring for prolonged ambulation. Upon return pt with low BPs and RN in room to assess. PT will follow up as schedule allows.)

## 2023-03-28 NOTE — Progress Notes (Signed)
Patient was aware she was waiting for PT to come see her before discharge. Patient and family did not want to wait for the evaluation. Patient left with daughter.

## 2023-03-28 NOTE — Progress Notes (Signed)
MEWS Progress Note  Patient Details Name: Erika [name] MRN: [id number] DOB: [birth year] Today's Date: 03/28/2023   MEWS Flowsheet Documentation:  Assess: MEWS Score Temp: 97.8 F (36.6 C) BP: (!) 80/48 MAP (mmHg): 66 Pulse Rate: 82 ECG Heart Rate: 69 Resp: 18 Level of Consciousness: Alert SpO2: 100 % O2 Device: Room Air Assess: MEWS Score MEWS Temp: 0 MEWS Systolic: 2 MEWS Pulse: 0 MEWS RR: 0 MEWS LOC: 0 MEWS Score: 2 MEWS Score Color: Yellow Assess: SIRS CRITERIA SIRS Temperature : 0 SIRS Respirations : 0 SIRS Pulse: 0 SIRS WBC: 1 SIRS Score Sum : 1 SIRS Temperature : 0 SIRS Pulse: 0 SIRS Respirations : 0 SIRS WBC: 1 SIRS Score Sum : 1 Assess: if the MEWS score is Yellow or Red Were vital signs accurate and taken at a resting state?: Yes Does the patient meet 2 or more of the SIRS criteria?: No MEWS guidelines implemented : Yes, yellow Treat MEWS Interventions: Considered administering scheduled or prn medications/treatments as ordered Take Vital Signs Increase Vital Sign Frequency : Yellow: Q2hr x1, continue Q4hrs until patient remains green for 12hrs Escalate MEWS: Escalate: Yellow: Discuss with charge nurse and consider notifying provider and/or RRT    MD [place] made aware. Patient recently transitioned from Cardizem drip to PO and has been hypotensive, but asymptomatic.       Virgel Manifold 03/28/2023, 1:11 PM

## 2023-03-28 NOTE — Progress Notes (Signed)
 TOC meds in a secure bag delivered to pt in room by this RN.

## 2023-03-29 ENCOUNTER — Ambulatory Visit: Payer: Self-pay | Admitting: Internal Medicine

## 2023-03-29 DIAGNOSIS — K529 Noninfective gastroenteritis and colitis, unspecified: Secondary | ICD-10-CM

## 2023-04-01 LAB — CULTURE, BLOOD (ROUTINE X 2)
Culture: NO GROWTH
Culture: NO GROWTH

## 2023-04-10 NOTE — Progress Notes (Unsigned)
 Electrophysiology Office Note:    Date:  04/11/2023   ID:  Erika [name], DOB 1955-05-08, MRN [id number]  [facility] Cardiologist:  Chrystie Nose, MD  [facility] Electrophysiologist:  Lanier Prude, MD   Referring MD: Chrystie Nose, MD   Chief Complaint: Atrial fibrillation  History of Present Illness:    Erika [name] is a 68 year old woman who I am seeing today for an evaluation of atrial fibrillation and GI bleeding at the request of Dr. Rennis Golden.  The patient has a history of atrial fibrillation, GI bleeding, diabetes.  The patient last saw Dr. Rennis Golden March 03, 2023.  She was hospitalized recently for GI bleeding with hemoglobin less than 8.  At the last appointment, the Watchman procedure was discussed and she presents today to discuss further.  Her GI bleeding led to a hospitalization in January 2025.  Her bleeding occurred after colonoscopy and endoscopy.  Her hemoglobin was as low as 6.1 from a normal baseline.  Her Eliquis was stopped.       Their past medical, social and family history was reviewed.   ROS:   Please see the history of present illness.    All other systems reviewed and are negative.  EKGs/Labs/Other Studies Reviewed:    The following studies were reviewed today:  November 02, 2019 echo EF 60-65 RV normal Severely dilated left atrium Trivial MR  March 27, 2023 EKG shows atrial fibrillation with rapid ventricular rate near 150 bpm  March 03, 2023 EKG shows atrial fibrillation with rapid ventricular rate  Jun 29, 2021 EKG shows atrial fibrillation  January 08, 2021 EKG shows atrial fibrillation       Physical Exam:    VS:  BP 102/70   Pulse (!) 111   Ht 5' 4.75" (1.645 m)   Wt 157 lb 12.8 oz (71.6 kg)   SpO2 90%   BMI 26.46 kg/m     Wt Readings from Last 3 Encounters:  04/11/23 157 lb 12.8 oz (71.6 kg)  03/27/23 154 lb 1.6 oz (69.9 kg)  03/03/23 158 lb (71.7 kg)     GEN: no distress CARD: Irregularly irregular,  No MRG RESP: No IWOB. CTAB.        ASSESSMENT AND PLAN:    1. Permanent atrial fibrillation (HCC)   2. History of GI bleed     #Permanent atrial fibrillation The patient has been in atrial fibrillation for years.  She was on anticoagulation but experienced severe GI bleeding requiring transfusion shortly after endoscopy.  She is concerned about her risk of recurrent bleeding and is interested in left atrial appendage occlusion as a stroke risk mitigation strategy.  I discussed the Watchman procedure today in detail including the risks and need for short-term anticoagulation she is going to talk it over with family and let us know if she wants to proceed.  ---------------------  I have seen Erika [name] in the office today who is being considered for a Watchman left atrial appendage closure device. I believe they will benefit from this procedure given their history of atrial fibrillation, CHA2DS2-VASc score of 4 and unadjusted ischemic stroke rate of 4.8% per year. Unfortunately, the patient is not felt to be a long term anticoagulation candidate secondary to GI bleeding. The patient's chart has been reviewed and I feel that they would be a candidate for short term oral anticoagulation after Watchman implant.   It is my belief that after undergoing a LAA closure procedure, Erika [name] will  not need long term anticoagulation which eliminates anticoagulation side effects and major bleeding risk.   Procedural risks for the Watchman implant have been reviewed with the patient including a 0.5% risk of stroke, <1% risk of perforation and <1% risk of device embolization. Other risks include bleeding, vascular damage, tamponade, worsening renal function, and death. The patient understands these risk and wishes to proceed.     The published clinical data on the safety and effectiveness of WATCHMAN include but are not limited to the following: - Holmes DR, Everlene Farrier, Sick P et al. for the PROTECT  AF Investigators. Percutaneous closure of the left atrial appendage versus warfarin therapy for prevention of stroke in patients with atrial fibrillation: a randomised non-inferiority trial. Lancet 2009; 374: 534-42. Everlene Farrier, Doshi SK, Isa Rankin D et al. on behalf of the PROTECT AF Investigators. Percutaneous Left Atrial Appendage Closure for Stroke Prophylaxis in Patients With Atrial Fibrillation 2.3-Year Follow-up of the PROTECT AF (Watchman Left Atrial Appendage System for Embolic Protection in Patients With Atrial Fibrillation) Trial. Circulation 2013; 127:720-729. - Alli O, Doshi S,  Kar S, Reddy VY, Sievert H et al. Quality of Life Assessment in the Randomized PROTECT AF (Percutaneous Closure of the Left Atrial Appendage Versus Warfarin Therapy for Prevention of Stroke in Patients With Atrial Fibrillation) Trial of Patients at Risk for Stroke With Nonvalvular Atrial Fibrillation. J Am Coll Cardiol 2013; 61:1790-8. Aline August DR, Mia [place], Price M, Whisenant B, Sievert H, Doshi S, Huber K, Reddy V. Prospective randomized evaluation of the Watchman left atrial appendage Device in patients with atrial fibrillation versus long-term warfarin therapy; the PREVAIL trial. Journal of the [employer] of Cardiology, Vol. 4, No. 1, 2014, 1-11. - Kar S, Doshi SK, Sadhu A, Horton R, Osorio J et al. Primary outcome evaluation of a next-generation left atrial appendage closure device: results from the PINNACLE FLX trial. Circulation 2021;143(18)1754-1762.    After today's visit with the patient which was dedicated solely for shared decision making visit regarding LAA closure device, the patient decided to think about it a little bit longer with family.  She will let us know if she wants to proceed. Prior to the procedure, I would like to obtain a gated CT scan of the chest with contrast timed for PV/LA visualization.   Additionally, the patient will need an updated echo.  HAS-BLED score 3 Hypertension  Yes  Abnormal renal and liver function (Dialysis, transplant, Cr >2.26 mg/dL /Cirrhosis or Bilirubin >2x Normal or AST/ALT/AP >3x Normal) No  Stroke No  Bleeding Yes  Labile INR (Unstable/high INR) No  Elderly (>65) Yes  Drugs or alcohol (>= 8 drinks/week, anti-plt or NSAID) No   CHA2DS2-VASc Score = 4  The patient's score is based upon: CHF History: 0 HTN History: 1 Diabetes History: 1 Stroke History: 0 Vascular Disease History: 0 Age Score: 1 Gender Score: 1   If she elects to proceed, plan to start Eliquis 5 mg by mouth twice daily 2 weeks before the procedure and continue for 45 days following the procedure.   Signed, Rossie Muskrat. Lalla Brothers, MD, [facility], [facility] 04/11/2023 11:13 AM    Electrophysiology [facility]

## 2023-04-11 ENCOUNTER — Ambulatory Visit: Payer: Medicare Other | Attending: Cardiology | Admitting: Cardiology

## 2023-04-11 ENCOUNTER — Encounter: Payer: Self-pay | Admitting: Cardiology

## 2023-04-11 VITALS — BP 102/70 | HR 111 | Ht 64.75 in | Wt 157.8 lb

## 2023-04-11 DIAGNOSIS — Z8719 Personal history of other diseases of the digestive system: Secondary | ICD-10-CM | POA: Insufficient documentation

## 2023-04-11 DIAGNOSIS — I4821 Permanent atrial fibrillation: Secondary | ICD-10-CM | POA: Insufficient documentation

## 2023-04-11 NOTE — Patient Instructions (Signed)
 Medication Instructions:  Your physician recommends that you continue on your current medications as directed. Please refer to the Current Medication list given to you today.  *If you need a refill on your cardiac medications before your next appointment, please call your pharmacy*   Follow-Up: At [facility], you and your health needs are our priority.  As part of our continuing mission to provide you with exceptional heart care, we have created designated Provider Care Teams.  These Care Teams include your primary Cardiologist (physician) and Advanced Practice Providers (APPs -  Physician Assistants and Nurse Practitioners) who all work together to provide you with the care you need, when you need it.  Please contact Nurse Navigator, Karsten Fells at [phone] if you decide to proceed with watchman procedure

## 2023-05-05 ENCOUNTER — Telehealth: Payer: Self-pay

## 2023-05-05 DIAGNOSIS — I4821 Permanent atrial fibrillation: Secondary | ICD-10-CM

## 2023-05-05 MED ORDER — METOPROLOL TARTRATE 50 MG PO TABS: 50.0000 mg | ORAL_TABLET | Freq: Once | ORAL | 0 refills | Status: AC

## 2023-05-05 NOTE — Telephone Encounter (Signed)
 The patient called to report she wishes to proceed with LAAO testing. Scheduled her for echocardiogram 06/08/2023. Scheduled her for CT 05/17/2023. Mychart message sent with instructions. She was grateful for call and agreed with plan.

## 2023-05-05 NOTE — Addendum Note (Signed)
 Addended by: Gunnar Fusi A on: 05/05/2023 03:02 PM   Modules accepted: Orders

## 2023-05-11 ENCOUNTER — Telehealth: Payer: Self-pay | Admitting: *Deleted

## 2023-05-11 ENCOUNTER — Other Ambulatory Visit: Payer: Self-pay | Admitting: Hematology and Oncology

## 2023-05-11 ENCOUNTER — Inpatient Hospital Stay: Payer: Medicare Other

## 2023-05-11 ENCOUNTER — Inpatient Hospital Stay: Attending: Hematology and Oncology

## 2023-05-11 ENCOUNTER — Inpatient Hospital Stay: Payer: Medicare Other | Admitting: Hematology and Oncology

## 2023-05-11 ENCOUNTER — Inpatient Hospital Stay (HOSPITAL_BASED_OUTPATIENT_CLINIC_OR_DEPARTMENT_OTHER): Admitting: Hematology and Oncology

## 2023-05-11 DIAGNOSIS — D72829 Elevated white blood cell count, unspecified: Secondary | ICD-10-CM | POA: Insufficient documentation

## 2023-05-11 DIAGNOSIS — Z800 Family history of malignant neoplasm of digestive organs: Secondary | ICD-10-CM | POA: Insufficient documentation

## 2023-05-11 DIAGNOSIS — Z862 Personal history of diseases of the blood and blood-forming organs and certain disorders involving the immune mechanism: Secondary | ICD-10-CM | POA: Insufficient documentation

## 2023-05-11 DIAGNOSIS — G4733 Obstructive sleep apnea (adult) (pediatric): Secondary | ICD-10-CM | POA: Insufficient documentation

## 2023-05-11 NOTE — Progress Notes (Signed)
 Rescheduled

## 2023-05-11 NOTE — Telephone Encounter (Signed)
 Received vm message from pt's daughter, Cicero Duck. She states she needs to reschedule her mother's appts for today. Her mother has been out of insulin for several days and is working on getting her blood sugar under control. She thinks next week will be a better time. Message sent to scheduler.

## 2023-05-15 ENCOUNTER — Encounter: Payer: Self-pay | Admitting: Hematology and Oncology

## 2023-05-16 ENCOUNTER — Telehealth: Payer: Self-pay | Admitting: Hematology and Oncology

## 2023-05-17 ENCOUNTER — Ambulatory Visit (HOSPITAL_COMMUNITY)
Admission: RE | Admit: 2023-05-17 | Discharge: 2023-05-17 | Disposition: A | Discharge status: Home / Self Care | Source: Ambulatory Visit | Attending: Cardiology | Admitting: Cardiology

## 2023-05-17 DIAGNOSIS — I4821 Permanent atrial fibrillation: Secondary | ICD-10-CM | POA: Diagnosis not present

## 2023-05-17 LAB — POCT I-STAT CREATININE: Creatinine, Ser: 0.9 mg/dL (ref 0.44–1.00)

## 2023-05-17 MED ORDER — METOPROLOL TARTRATE 5 MG/5ML IV SOLN
INTRAVENOUS | Status: AC
  Filled 2023-05-17: qty 10

## 2023-05-17 MED ORDER — IOHEXOL 350 MG/ML SOLN
100.0000 mL | Freq: Once | INTRAVENOUS | Status: AC | PRN
  Administered 2023-05-17: 100 mL via INTRAVENOUS

## 2023-05-17 MED ORDER — METOPROLOL TARTRATE 5 MG/5ML IV SOLN
10.0000 mg | Freq: Once | INTRAVENOUS | Status: AC | PRN
  Administered 2023-05-17: 5 mg via INTRAVENOUS

## 2023-05-17 MED ORDER — DILTIAZEM HCL 25 MG/5ML IV SOLN: 10.0000 mg | INTRAVENOUS | Status: DC | PRN

## 2023-05-18 NOTE — Progress Notes (Signed)
 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 Angelene Giovanni. WBC 11.3, Hgb 14.2, MCV 94.8, Plt 177 10/08/2022: last visit with Dr. Angelene Giovanni  11/04/2022: Bone marrow biopsy showed hypercellular bone marrow with otherwise orderly trilineage hematopoiesis in the background of an absolute neutrophilia  11/18/2022: establish care with Dr. Leonides Schanz   Interval History:  Erika [name] 68 y.o. female with medical history significant for leukocytosis who presents for a follow up visit. The patient's last visit was on 11/18/2022. In the interim since the last visit she had a hospitalization where she had a severe drop in her blood counts due to likely GI bleed.  On exam today Erika [name] is accompanied by her husband.  She reports that her hospitalization "took a long time to recover from".  She reports that she is not currently taking any supplements but was taking iron pills initially.  She reports that she last took iron pills about 2 months ago.  She has not had any subsequent bleeding, bruising, or dark stools.  She reports that she is eating well but does still feel tired on occasion.  She reports her energy is about a 7 out of 10.  Her appetite has been strong but she does not eat a lot of meat.  She is not having any lightheadedness, dizziness, shortness of breath.  She has had no infectious symptoms such as runny nose, sore throat, cough.  She reports however she did recently have a yeast infection which she has recovered from.  She has had no other changes in her health in the last 6 months.  She otherwise denies any fevers, chills,  sweats, nausea, vomiting or diarrhea.  Full 10 point ROS is otherwise negative.  MEDICAL HISTORY:  Past Medical History:  Diagnosis Date   Anemia    "just related to fibroids"   Anxiety    takes Clonazepam daily as needed   Arthritis    "wrists; neck" (04/10/2013)   Basal cell carcinoma of nasal tip    Cataract    bilateral   Cramping of feet    takes Potassium daily   Dysphagia    History of colon polyps    Hyperlipidemia    takes Crestor nightly "because I'm a diabetic"   Hypertension    Insomnia    takes Benadryl nightly   Joint pain    Joint swelling    Neuromuscular disorder (HCC)    feet   Neuropathy    both feet   Osteoarthritis of right knee 04/10/2013   Seasonal allergies    takes Omnaris and Claritin daily as needed   Sleep apnea    SVD (spontaneous vaginal delivery)    x 1   Type II diabetes mellitus (HCC)    takes Janumet daily   Urinary frequency     SURGICAL HISTORY: Past Surgical History:  Procedure Laterality Date   ABDOMINAL HYSTERECTOMY     ANTERIOR AND POSTERIOR REPAIR N/A 02/06/2014   Procedure: ANTERIOR (CYSTOCELE) AND POSTERIOR REPAIR (RECTOCELE);  Surgeon: Lavina Hamman, MD;  Location: [facility];  Service: Gynecology;  Laterality: N/A;   BONE SPURS Left 10/09/1968   "ankle"   CLOSED REDUCTION NASAL FRACTURE  ~  2012   COLONOSCOPY     HYSTEROSCOPY WITH RESECTOSCOPE  02/08/2002   "w/fibroids removed"   JOINT REPLACEMENT     right knee   MELANOMA EXCISION  02/09/2012   "tip of nose"   TONSILLECTOMY  ~ 1959   TOTAL KNEE ARTHROPLASTY Right 04/10/2013   TOTAL KNEE ARTHROPLASTY Right 04/10/2013   Procedure: TOTAL KNEE ARTHROPLASTY;  Surgeon: Neville Barbone, MD;  Location: [facility];  Service: Orthopedics;  Laterality: Right;   VAGINAL HYSTERECTOMY N/A 02/06/2014   Procedure: HYSTERECTOMY VAGINAL;  Surgeon: Cyd Dowse, MD;  Location: [facility];  Service: Gynecology;  Laterality: N/A;    SOCIAL HISTORY: Social History   Socioeconomic History    Marital status: Married    Spouse name: Not on file   Number of children: Not on file   Years of education: Not on file   Highest education level: Not on file  Occupational History   Not on file  Tobacco Use   Smoking status: Never   Smokeless tobacco: Never  Vaping Use   Vaping status: Never Used  Substance and Sexual Activity   Alcohol use: Yes    Alcohol/week: 3.0 standard drinks of alcohol    Types: 3 Glasses of wine per week    Comment: socially   Drug use: No   Sexual activity: Yes    Birth control/protection: Post-menopausal  Other Topics Concern   Not on file  Social History Narrative   Not on file   Social Drivers of Health   Financial Resource Strain: Not on file  Food Insecurity: No Food Insecurity (03/27/2023)   Hunger Vital Sign    Worried About Running Out of Food in the Last Year: Never true    Ran Out of Food in the Last Year: Never true  Transportation Needs: No Transportation Needs (03/27/2023)   PRAPARE - Administrator, Civil Service (Medical): No    Lack of Transportation (Non-Medical): No  Physical Activity: Not on file  Stress: Not on file  Social Connections: Unknown (03/27/2023)   Social Connection and Isolation Panel [NHANES]    Frequency of Communication with Friends and Family: More than three times a week    Frequency of Social Gatherings with Friends and Family: Once a week    Attends Religious Services: Patient declined    Database administrator or Organizations: No    Attends Banker Meetings: Never    Marital Status: Married  Catering manager Violence: Not At Risk (03/27/2023)   Humiliation, Afraid, Rape, and Kick questionnaire    Fear of Current or Ex-Partner: No    Emotionally Abused: No    Physically Abused: No    Sexually Abused: No    FAMILY HISTORY: Family History  Problem Relation Age of Onset   Bronchitis Mother    Liver cancer Father    Diabetes Sister     ALLERGIES:  is allergic to  amoxicillin, amoxicillin-pot clavulanate, fluticasone propionate, nsaids, sulfa antibiotics, tetanus toxoids, and tramadol hcl.  MEDICATIONS:  Current Outpatient Medications  Medication Sig Dispense Refill   ALPRAZolam (XANAX) 0.25 MG tablet Take 0.25 mg by mouth at bedtime as needed.     ARTIFICIAL TEARS 0.1-0.3 % SOLN Place 2 drops into both eyes daily as needed (for dry eye).     Calcium Carb-Cholecalciferol 600-800 MG-UNIT TABS Take 1 tablet by mouth daily.     cetirizine (ZYRTEC) 10 MG tablet Take 10 mg by mouth daily.     DILT-XR 120  MG 24 hr capsule Take 120 mg by mouth daily.     Dulaglutide (TRULICITY) 3 MG/0.5ML SOPN Inject 3 mg into the skin once a week. 2 mL 2   escitalopram (LEXAPRO) 10 MG tablet Take 1 tablet by mouth daily.     ferrous sulfate 325 (65 FE) MG EC tablet Take 1 tablet (325 mg total) by mouth every other day. (Patient taking differently: Take 325 mg by mouth daily.) 60 tablet 0   insulin glargine, 1 Unit Dial, (TOUJEO SOLOSTAR) 300 UNIT/ML Solostar Pen Inject 40 Units into the skin every evening.     lisinopril (ZESTRIL) 2.5 MG tablet Take 2.5 mg by mouth daily.     metFORMIN (GLUCOPHAGE-XR) 500 MG 24 hr tablet Take 1,000 mg by mouth 2 (two) times daily.     metoprolol tartrate (LOPRESSOR) 50 MG tablet Take 1 tablet (50 mg total) by mouth once for 1 dose. Take at 7:00AM the morning of your CT scan if your heart rate is greater than 80 beats per minute. 1 tablet 0   montelukast (SINGULAIR) 10 MG tablet Take 10 mg by mouth daily.     Omega-3 Fatty Acids (FISH OIL PO) Take 1 tablet by mouth in the morning and at bedtime.     pantoprazole (PROTONIX) 40 MG tablet Take 1 tablet (40 mg total) by mouth daily. 30 tablet 1   Potassium Gluconate 550 MG TABS Take 550 mg by mouth every evening.     rosuvastatin (CRESTOR) 5 MG tablet Take 5 mg by mouth daily.     No current facility-administered medications for this visit.    REVIEW OF SYSTEMS:   Constitutional: ( - ) fevers,  ( - )  chills , ( - ) night sweats Eyes: ( - ) blurriness of vision, ( - ) double vision, ( - ) watery eyes Ears, nose, mouth, throat, and face: ( - ) mucositis, ( - ) sore throat Respiratory: ( - ) cough, ( - ) dyspnea, ( - ) wheezes Cardiovascular: ( - ) palpitation, ( - ) chest discomfort, ( - ) lower extremity swelling Gastrointestinal:  ( - ) nausea, ( - ) heartburn, ( - ) change in bowel habits Skin: ( - ) abnormal skin rashes Lymphatics: ( - ) new lymphadenopathy, ( - ) easy bruising Neurological: ( - ) numbness, ( - ) tingling, ( - ) new weaknesses Behavioral/Psych: ( - ) mood change, ( - ) new changes  All other systems were reviewed with the patient and are negative.  PHYSICAL EXAMINATION: Vitals:   05/24/23 0925  BP: 113/61  Pulse: (!) 116  Resp: 17  Temp: (!) 97.3 F (36.3 C)  SpO2: 99%    Filed Weights   05/24/23 0925  Weight: 153 lb (69.4 kg)     GENERAL: well appearing elderly Caucasian female, alert, no distress and comfortable SKIN: skin color, texture, turgor are normal, no rashes or significant lesions EYES: conjunctiva are pink and non-injected, sclera clear LUNGS: clear to auscultation and percussion with normal breathing effort HEART: regular rate & rhythm and no murmurs and no lower extremity edema Musculoskeletal: no cyanosis of digits and no clubbing  PSYCH: alert & oriented x 3, fluent speech NEURO: no focal motor/sensory deficits  LABORATORY DATA:  I have reviewed the data as listed    Latest Ref Rng & Units 05/24/2023    8:59 AM 03/28/2023    5:43 AM 03/27/2023    1:31 PM  CBC  WBC 4.0 - 10.5  K/uL 13.2  16.9  22.2   Hemoglobin 12.0 - 15.0 g/dL 16.1  9.7  09.6   Hematocrit 36.0 - 46.0 % 39.8  32.1  38.0   Platelets 150 - 400 K/uL 142  154  166        Latest Ref Rng & Units 05/24/2023    8:59 AM 05/17/2023    8:08 AM 03/28/2023    5:43 AM  CMP  Glucose 70 - 99 mg/dL 045   409   BUN 8 - 23 mg/dL 15   12   Creatinine 8.11 - 1.00 mg/dL 9.14   7.82  9.56   Sodium 135 - 145 mmol/L 139   137   Potassium 3.5 - 5.1 mmol/L 4.3   3.3   Chloride 98 - 111 mmol/L 105   105   CO2 22 - 32 mmol/L 29   23   Calcium 8.9 - 10.3 mg/dL 9.5   8.5   Total Protein 6.5 - 8.1 g/dL 7.6   6.5   Total Bilirubin 0.0 - 1.2 mg/dL 0.3   0.4   Alkaline Phos 38 - 126 U/L 60   54   AST 15 - 41 U/L 11   13   ALT 0 - 44 U/L 8   19     RADIOGRAPHIC STUDIES: CT CARDIAC MORPH/PULM VEIN W/CM&W/O CA SCORE Addendum Date: 05/21/2023 ADDENDUM REPORT: 05/21/2023 19:23 EXAM: OVER-READ INTERPRETATION  CT CHEST The following report is an over-read performed by radiologist Dr. Cyndia Drape [facility] on 05/21/2023. This over-read does not include interpretation of cardiac or coronary anatomy or pathology. The coronary CTA interpretation by the cardiologist is attached. COMPARISON:  03/27/2023. FINDINGS: Cardiovascular:  See findings discussed in the body of the report. Mediastinum/Nodes: No suspicious adenopathy identified. Imaged mediastinal structures are unremarkable. Lungs/Pleura: Imaged lungs are clear. No pleural effusion or pneumothorax. Upper Abdomen: No acute abnormality. Musculoskeletal: No chest wall abnormality. No acute osseous findings. IMPRESSION: No acute extracardiac incidental findings. Electronically Signed   By: Sydell Eva M.D.   On: 05/21/2023 19:23   Result Date: 05/21/2023 CLINICAL DATA:  Atrial fibrillation scheduled for left atrial appendage closure. EXAM: Cardiac CT/CTA TECHNIQUE: A 120 kV retrospective scan was triggered in the ascending thoracic aorta at 140 HU's. Gantry rotation speed was 250 msecs and collimation was .6 mm. No beta blockade and no NTG was given. The 3D data set was reconstructed for best systolic and diastolic phases along with delayed images of the LAA Images analyzed on a dedicated work station using MPR, MIP and VRT modes. The patient received 80 cc of contrast. FINDINGS: Image quality: Good. Noise artifact is:  Limited. Left Atrium: The left atrial size is severely dilated. There is no PFO/ASD. There is normal variant pulmonary vein drainage into the left atrium (3 on the right and 2 on the left with L common antrum). Left Atrial Appendage: Morphology: The left atrial appendage is bilobed, mixed windsock/cauliflower morphology. Thrombus: There is no thrombus in the left atrial appendage on contrast or delayed imaging. The following measurements were made regarding left atrial appendage closure: Phase assessed: 30% Landing Zone measurement: 28.6 x 23.1 mm, AVG DIAMETER 25.7 mm LAA Length (maximum): 22 mm Optimal interatrial septum puncture site: mid-posterior Optimal deployment angle: RAO 23 CAU 1 Catheter: A double curve catheter is recommended. Watchman FLX Device: A 31 mm device is recommended with 17% compression. Other comments: None. Coronary Arteries: Normal coronary origin. Right dominance. The study was performed without use of  NTG and insufficient for plaque evaluation. Coronary artery calcium score is 1698, which is 99th percentile for age and sex matched peers. Right Atrium: Right atrial size is severely dilated. Right Ventricle: The right ventricular cavity is within normal limits. Left Ventricle: The ventricular cavity size is within normal limits. There are no stigmata of prior infarction. There is no abnormal filling defect. Pulmonary Artery: Moderately dilated without proximal filling defect. Cardiac valves: The aortic valve is trileaflet with trivial calcification. The mitral valve is normal structure with trivial calcification. Aorta: Normal caliber 35 mm mid ascending aorta with no significant disease. Pericardium: Normal thickness with no significant effusion or calcium present. Extra-cardiac findings: See attached radiology report for non-cardiac structures. IMPRESSION: 1. The left atrial appendage is bilobed, mixed windsock/cauliflower morphology. 2. A 31 mm watchman FLX device is recommended based on  the above landing zone measurements (28.6 mm maximum diameter; 17% compression). 3. There is no thrombus in the left atrial appendage. 4. An mid posterior IAS puncture site is recommended. 5. Optimal deployment angle: RAO 23 CAU 1 6. Normal coronary origin. Right dominance. Coronary artery calcium score is 1698, which is 99th percentile for age and sex matched peers. Electronically Signed: By: Grady Lawman M.D. On: 05/17/2023 23:08    ASSESSMENT & PLAN Erika [name] 68 y.o. female with medical history significant for leukocytosis who presents for a follow up visit.  # Leukocytosis, Neutrophilic Predominate  # Tier III CSF3R mutation -- Bone marrow biopsy does not show any evidence of underlying malignancy or concerning abnormalities. -- Discussed today the possible etiologies for her leukocytosis included the CSF3R mutation as well as untreated OSA. -- Encourage patient to follow-up with her sleep medicine doctor in order to further evaluate and consider treatment options including CPAP. -- Labs on 11/04/2022 show white blood cell count 13.2, hemoglobin 12.4, MCV 84.5, platelets 142 -- Recommend return to clinic in 6 months.  If stable at that time would recommend q. 86-month follow-up.  # Severe Anemia-- resolved -- Will check iron, ferritin, and vitamin B12 levels today. -- Patient had hemoglobin dropped down to 6.1 on 02/15/2023.  Etiology was thought to be GI bleed. -- Return to clinic in 6 months to reevaluate.  Can see sooner if nutritional deficiencies are noted.  No orders of the defined types were placed in this encounter.   All questions were answered. The patient knows to call the clinic with any problems, questions or concerns.  A total of more than 30 minutes were spent on this encounter with face-to-face time and non-face-to-face time, including preparing to see the patient, ordering tests and/or medications, counseling the patient and coordination of care as outlined above.    Rogerio Clay, MD Department of Hematology/Oncology [facility] at [facility] Phone: [phone] Pager: [phone] Email: [email or web address]  05/24/2023 11:22 AM

## 2023-05-24 ENCOUNTER — Other Ambulatory Visit: Payer: Self-pay | Admitting: Hematology and Oncology

## 2023-05-24 ENCOUNTER — Inpatient Hospital Stay

## 2023-05-24 ENCOUNTER — Inpatient Hospital Stay (HOSPITAL_BASED_OUTPATIENT_CLINIC_OR_DEPARTMENT_OTHER): Admitting: Hematology and Oncology

## 2023-05-24 VITALS — BP 113/61 | HR 116 | Temp 97.3°F | Resp 17 | Wt 153.0 lb

## 2023-05-24 DIAGNOSIS — Z862 Personal history of diseases of the blood and blood-forming organs and certain disorders involving the immune mechanism: Secondary | ICD-10-CM | POA: Diagnosis not present

## 2023-05-24 DIAGNOSIS — G4733 Obstructive sleep apnea (adult) (pediatric): Secondary | ICD-10-CM | POA: Diagnosis not present

## 2023-05-24 DIAGNOSIS — D500 Iron deficiency anemia secondary to blood loss (chronic): Secondary | ICD-10-CM

## 2023-05-24 DIAGNOSIS — D72829 Elevated white blood cell count, unspecified: Secondary | ICD-10-CM

## 2023-05-24 DIAGNOSIS — Z800 Family history of malignant neoplasm of digestive organs: Secondary | ICD-10-CM | POA: Diagnosis not present

## 2023-05-24 LAB — CMP (CANCER CENTER ONLY)
ALT: 8 U/L (ref 0–44)
AST: 11 U/L — ABNORMAL LOW (ref 15–41)
Albumin: 4.2 g/dL (ref 3.5–5.0)
Alkaline Phosphatase: 60 U/L (ref 38–126)
Anion gap: 5 (ref 5–15)
BUN: 15 mg/dL (ref 8–23)
CO2: 29 mmol/L (ref 22–32)
Calcium: 9.5 mg/dL (ref 8.9–10.3)
Chloride: 105 mmol/L (ref 98–111)
Creatinine: 0.6 mg/dL (ref 0.44–1.00)
GFR, Estimated: >60 mL/min (ref >60)
Glucose, Bld: 237 mg/dL — ABNORMAL HIGH (ref 70–99)
Potassium: 4.3 mmol/L (ref 3.5–5.1)
Sodium: 139 mmol/L (ref 135–145)
Total Bilirubin: 0.3 mg/dL (ref 0.0–1.2)
Total Protein: 7.6 g/dL (ref 6.5–8.1)

## 2023-05-24 LAB — CBC WITH DIFFERENTIAL (CANCER CENTER ONLY)
Abs Immature Granulocytes: 0.22 10*3/uL — ABNORMAL HIGH (ref 0.00–0.07)
Basophils Absolute: 0.1 10*3/uL (ref 0.0–0.1)
Basophils Relative: 1 %
Eosinophils Absolute: 0.1 10*3/uL (ref 0.0–0.5)
Eosinophils Relative: 1 %
HCT: 39.8 % (ref 36.0–46.0)
Hemoglobin: 12.4 g/dL (ref 12.0–15.0)
Immature Granulocytes: 2 %
Lymphocytes Relative: 14 %
Lymphs Abs: 1.9 10*3/uL (ref 0.7–4.0)
MCH: 26.3 pg (ref 26.0–34.0)
MCHC: 31.2 g/dL (ref 30.0–36.0)
MCV: 84.5 fL (ref 80.0–100.0)
Monocytes Absolute: 0.6 10*3/uL (ref 0.1–1.0)
Monocytes Relative: 5 %
Neutro Abs: 10.3 10*3/uL — ABNORMAL HIGH (ref 1.7–7.7)
Neutrophils Relative %: 77 %
Platelet Count: 142 10*3/uL — ABNORMAL LOW (ref 150–400)
RBC: 4.71 MIL/uL (ref 3.87–5.11)
RDW: 16.9 % — ABNORMAL HIGH (ref 11.5–15.5)
WBC Count: 13.2 10*3/uL — ABNORMAL HIGH (ref 4.0–10.5)
nRBC: 0 % (ref 0.0–0.2)

## 2023-05-24 LAB — IRON AND IRON BINDING CAPACITY (CC-WL,HP ONLY)
Iron: 61 ug/dL (ref 28–170)
Saturation Ratios: 15 % (ref 10.4–31.8)
TIBC: 410 ug/dL (ref 250–450)
UIBC: 349 ug/dL (ref 148–442)

## 2023-05-24 LAB — RETIC PANEL
Immature Retic Fract: 16.2 % — ABNORMAL HIGH (ref 2.3–15.9)
RBC.: 4.67 MIL/uL (ref 3.87–5.11)
Retic Count, Absolute: 38.3 10*3/uL (ref 19.0–186.0)
Retic Ct Pct: 0.8 % (ref 0.4–3.1)
Reticulocyte Hemoglobin: 32.1 pg (ref >27.9)

## 2023-05-24 LAB — VITAMIN B12: Vitamin B-12: 324 pg/mL (ref 180–914)

## 2023-05-24 LAB — FOLATE: Folate: 21.5 ng/mL (ref >5.9)

## 2023-05-24 LAB — FERRITIN: Ferritin: 11 ng/mL (ref 11–307)

## 2023-05-26 LAB — METHYLMALONIC ACID, SERUM: Methylmalonic Acid, Quantitative: 121 nmol/L (ref 0–378)

## 2023-05-30 ENCOUNTER — Telehealth: Payer: Self-pay

## 2023-05-30 ENCOUNTER — Other Ambulatory Visit (HOSPITAL_COMMUNITY): Payer: Self-pay

## 2023-05-30 NOTE — Telephone Encounter (Signed)
 Erika [name]: Distal filling defect. Delayed images look good. Max 23.5/ AVG 21/ Depth 15.3 Likely use a 27mm device and double curve sheath Inf/Mid TSP RAO 15 CAU 15

## 2023-06-07 ENCOUNTER — Other Ambulatory Visit: Payer: Self-pay | Admitting: *Deleted

## 2023-06-07 MED ORDER — FERROUS SULFATE 325 (65 FE) MG PO TBEC: 325.0000 mg | DELAYED_RELEASE_TABLET | Freq: Every day | ORAL | 99 refills | Status: AC

## 2023-06-07 NOTE — Telephone Encounter (Signed)
-----   Message from Nurse Katherne Pander sent at 06/07/2023  5:38 PM EDT -----  ----- Message ----- From: Ander Bame, MD Sent: 06/07/2023   2:25 PM EDT To: Colin Dawley, RN  Please let Mrs. Clingerman know that her ferritin levels are quite low but her hemoglobin number is normal.  I do think that she would benefit from p.o. iron therapy.  If she is willing please call in ferrous sulfate  325 mg p.o. daily to pharmacy of her choosing.  Will plan to see her back as scheduled in October 2025. ----- Message ----- From: Dannis Dy, [facility] [place] Sent: 05/24/2023   9:23 AM EDT To: Ander Bame, MD

## 2023-06-07 NOTE — Telephone Encounter (Signed)
 Notified pt of low ferritin & normal HGB per Dr Rosaline Coma.  She reports willing to try oral iron.  This will be sent to her pharmacy & she will f/u with Dr Rosaline Coma as scheduled in Oct.

## 2023-06-08 ENCOUNTER — Ambulatory Visit (HOSPITAL_COMMUNITY): Attending: Cardiovascular Disease

## 2023-06-08 DIAGNOSIS — I4821 Permanent atrial fibrillation: Secondary | ICD-10-CM | POA: Insufficient documentation

## 2023-06-08 LAB — ECHOCARDIOGRAM COMPLETE: S' Lateral: 3 cm

## 2023-06-09 ENCOUNTER — Ambulatory Visit: Admitting: Internal Medicine

## 2023-06-09 ENCOUNTER — Ambulatory Visit: Attending: Nurse Practitioner | Admitting: Nurse Practitioner

## 2023-06-09 NOTE — Progress Notes (Deleted)
 Cardiology Office Note    Patient Name: Erika [name] Date of Encounter: 06/09/2023  Primary Care Provider:  Annabell Key, [place]  E, PA Primary Cardiologist:  Hazle Lites, MD Primary Electrophysiologist: Boyce Byes, MD   Past Medical History    Past Medical History:  Diagnosis Date   Anemia    "just related to fibroids"   Anxiety    takes Clonazepam  daily as needed   Arthritis    "wrists; neck" (04/10/2013)   Basal cell carcinoma of nasal tip    Cataract    bilateral   Cramping of feet    takes Potassium daily   Dysphagia    History of colon polyps    Hyperlipidemia    takes Crestor  nightly "because I'm a diabetic"   Hypertension    Insomnia    takes Benadryl  nightly   Joint pain    Joint swelling    Neuromuscular disorder (HCC)    feet   Neuropathy    both feet   Osteoarthritis of right knee 04/10/2013   Seasonal allergies    takes Omnaris and Claritin  daily as needed   Sleep apnea    SVD (spontaneous vaginal delivery)    x 1   Type II diabetes mellitus (HCC)    takes Janumet  daily   Urinary frequency     History of Present Illness  Erika [name] is a 68 y.o. female with a PMH of permanent atrial fibrillation (s/p Watchman due to GI bleed), DM type II, hyperlipidemia, OSA who presents today for follow-up.   Erika [name] was seen initially in 2015 by Dr. Nicholette Barley and then was followed by Dr. Alroy Aspen and currently followed by Dr. Maximo Spar.  She was diagnosed with atrial fibrillation in 2021 and placed on Eliquis and Cardizem .  She underwent a 2D echo in 10/2019 that showed EF of 60 to 65% with no RWMA and severely dilated LA with trivial MR.  She was seen initially by Dr. Maximo Spar in 01/2021 to establish care and blood pressure was well-controlled.  She was admitted 02/2023 with symptomatic anemia.  She was evaluated by GI and was found to have a GI bleed with Eliquis held for 10 days.  She was seen in follow-up by Dr. Maximo Spar following hospitalization on 03/02/2023 with  decision to be referred to structural heart for placement of Watchman device.  She was seen by Dr. Marven Slimmer on 04/11/2023 for evaluation of watchman.  She was deemed an appropriate candidate and wished to proceed.    Patient denies chest pain, palpitations, dyspnea, PND, orthopnea, nausea, vomiting, dizziness, syncope, edema, weight gain, or early satiety.   Discussed the use of AI scribe software for clinical note transcription with the patient, who gave verbal consent to proceed.  History of Present Illness    ***Notes: -Last ischemic evaluation:  Review of Systems  Please see the history of present illness.    All other systems reviewed and are otherwise negative except as noted above.  Physical Exam    Wt Readings from Last 3 Encounters:  05/24/23 153 lb (69.4 kg)  04/11/23 157 lb 12.8 oz (71.6 kg)  03/27/23 154 lb 1.6 oz (69.9 kg)   ZO:XWRUE were no vitals filed for this visit.,There is no height or weight on file to calculate BMI. GEN: Well nourished, well developed in no acute distress Neck: No JVD; No carotid bruits Pulmonary: Clear to auscultation without rales, wheezing or rhonchi  Cardiovascular: Normal rate. Regular rhythm. Normal S1. Normal S2.  Murmurs: There is no murmur.  ABDOMEN: Soft, non-tender, non-distended EXTREMITIES:  No edema; No deformity   EKG/LABS/ Recent Cardiac Studies   ECG personally reviewed by me today - ***  Risk Assessment/Calculations:   {Does this patient have ATRIAL FIBRILLATION?[phone]}      Lab Results  Component Value Date   WBC 13.2 (H) 05/24/2023   HGB 12.4 05/24/2023   HCT 39.8 05/24/2023   MCV 84.5 05/24/2023   PLT 142 (L) 05/24/2023   Lab Results  Component Value Date   CREATININE 0.60 05/24/2023   BUN 15 05/24/2023   NA 139 05/24/2023   K 4.3 05/24/2023   CL 105 05/24/2023   CO2 29 05/24/2023   No results found for: "CHOL", "HDL", "LDLCALC", "LDLDIRECT", "TRIG", "CHOLHDL"  Lab Results  Component Value Date    HGBA1C 8.7 (H) 03/27/2023   Assessment & Plan    1.***  2.***  3.***  4.***      Disposition: Follow-up with Hazle Lites, MD or APP in *** months {Are you ordering a CV Procedure (e.g. stress test, cath, DCCV, TEE, etc)?   Press F2        :[id number]}   Signed, Francene Ing, Retha Cast, NP 06/09/2023, 10:06 AM [facility]

## 2023-07-20 NOTE — Progress Notes (Addendum)
 Cardiology Clinic Note   Patient Name: Erika [name] Date of Encounter: 07/22/2023  Primary Care Provider:  Annabell [name], [place]  E, PA Primary Cardiologist:  Erika Lites, MD  Patient Profile    Erika [name] 68 year old female presents to the clinic today for follow-up evaluation of her atrial fibrillation and hyperlipidemia.  Past Medical History    Past Medical History:  Diagnosis Date   Anemia    just related to fibroids   Anxiety    takes Clonazepam  daily as needed   Arthritis    wrists; neck (04/10/2013)   Basal cell carcinoma of nasal tip    Cataract    bilateral   Cramping of feet    takes Potassium daily   Dysphagia    History of colon polyps    Hyperlipidemia    takes Crestor  nightly because I'm a diabetic   Hypertension    Insomnia    takes Benadryl  nightly   Joint pain    Joint swelling    Neuromuscular disorder (HCC)    feet   Neuropathy    both feet   Osteoarthritis of right knee 04/10/2013   Seasonal allergies    takes Omnaris and Claritin  daily as needed   Sleep apnea    SVD (spontaneous vaginal delivery)    x 1   Type II diabetes mellitus (HCC)    takes Janumet  daily   Urinary frequency    Past Surgical History:  Procedure Laterality Date   ABDOMINAL HYSTERECTOMY     ANTERIOR AND POSTERIOR REPAIR N/A 02/06/2014   Procedure: ANTERIOR (CYSTOCELE) AND POSTERIOR REPAIR (RECTOCELE);  Surgeon: Erika Dowse, MD;  Location: [facility];  Service: Gynecology;  Laterality: N/A;   BONE SPURS Left 10/09/1968   ankle   CLOSED REDUCTION NASAL FRACTURE  ~ 2012   COLONOSCOPY     HYSTEROSCOPY WITH RESECTOSCOPE  02/08/2002   w/fibroids removed   JOINT REPLACEMENT     right knee   MELANOMA EXCISION  02/09/2012   tip of nose   TONSILLECTOMY  ~ 1959   TOTAL KNEE ARTHROPLASTY Right 04/10/2013   TOTAL KNEE ARTHROPLASTY Right 04/10/2013   Procedure: TOTAL KNEE ARTHROPLASTY;  Surgeon: Erika Barbone, MD;  Location: [facility];  Service: Orthopedics;   Laterality: Right;   VAGINAL HYSTERECTOMY N/A 02/06/2014   Procedure: HYSTERECTOMY VAGINAL;  Surgeon: Erika Dowse, MD;  Location: [facility];  Service: Gynecology;  Laterality: N/A;    Allergies  Allergies  Allergen Reactions   Amoxicillin Hives    Only one case of HIVES.  Has taken it in the past.   Amoxicillin-Pot Clavulanate Hives   Fluticasone Propionate Other (See Comments)    Foot cramps-mild   Nsaids Nausea And Vomiting   Sulfa Antibiotics Nausea And Vomiting   Tetanus Toxoids Other (See Comments)    Very high fever   Tramadol  Hcl Other (See Comments)    Agitated    History of Present Illness    Erika [name] has PMH of atrial fibrillation, HLD, HTN, and acute GI bleed.  She was initially followed by Dr. Nicholette [name] in 2015 and then followed by Dr. Alroy [name] for atrial fibrillation.  Her husband is a patient of Dr. Maximo [name] as well and she requested a transfer of care.  Her atrial fibrillation was previously managed with Eliquis and diltiazem .  She was seen in follow-up by Dr. Maximo [name] on 03/03/2023.  She had been hospitalized for acute GI bleed following colonoscopy and endoscopy procedure in early January.  She developed severe weakness.  She was noted to be anemic and her hemoglobin was 6.  She required transfusion.  At discharge her hemoglobin had increased to 8 and was stable.  She had been anticoagulated prior to her procedure.  She continued to be in atrial fibrillation which was rate controlled.  Her A-fib was noted to be permanent.  Her CHA2DS2-VASc score was noted to be 4 placing her at placing her at a 4.8% risk for stroke.  It appeared that the bleeding occurred after her procedure which included dilation of her esophagus.  It was felt that she would be high risk for recurrent bleeding in the future.  Watchman device was discussed.  Her blood pressure was noted to be 98/58.  She denied recurrent bleeding.  She was referred to Dr. Marven [name] to discuss Watchman procedure.  She continued to  be fatigued.  Her lisinopril  was stopped.  Follow-up in 6 months was planned.  She was seen by Dr. Marven [name] on 04/11/2023.  Benefits and risks of watchman placement were reviewed.  At that time she wished to discuss the procedure further with her family.  She presents to the clinic today for follow-up evaluation and states she feels well. She has been walking her dog 2 times per day.  Her blood pressure today is 98/60.  Her heart rate is 79.  She is cardiac unaware.  She is on statin therapy.  She denies exertional chest discomfort.  She is totally asymptomatic from a cardiac standpoint.  We reviewed her previous cardiac history.  She and her husband expressed understanding.  We reviewed her EP visit.  She continues to be undecided about watchman placement.  We reviewed the procedure.  I reviewed her CHA2DS2-VASc score.  She notes that her PCP follows her cholesterol.  I will continue her current medication regimen, have her maintain high-fiber diet, and plan follow-up for 6 to 9 months..  Today she denies chest pain, shortness of breath, lower extremity edema, fatigue, palpitations, melena, hematuria, hemoptysis, diaphoresis, weakness, presyncope, syncope, orthopnea, and PND.   Home Medications    Prior to Admission medications   Medication Sig Start Date End Date Taking? Authorizing Provider  ALPRAZolam  (XANAX ) 0.25 MG tablet Take 0.25 mg by mouth at bedtime as needed. 09/10/19   [provider]  ARTIFICIAL TEARS 0.1-0.3 % SOLN Place 2 drops into both eyes daily as needed (for dry eye).    [provider]  Calcium  Carb-Cholecalciferol 600-800 MG-UNIT TABS Take 1 tablet by mouth daily.    [provider]  cetirizine (ZYRTEC) 10 MG tablet Take 10 mg by mouth daily.    [provider]  DILT-XR 120 MG 24 hr capsule Take 120 mg by mouth daily. 10/10/19   [provider]  Dulaglutide  (TRULICITY ) 3 MG/0.5ML SOPN Inject 3 mg into the skin once a week. 06/07/22      escitalopram  (LEXAPRO ) 10 MG tablet Take 1 tablet by mouth daily. 10/21/19   [provider]  ferrous sulfate  325 (65 FE) MG EC tablet Take 1 tablet (325 mg total) by mouth daily. 06/07/23 06/06/24  Ander Bame, MD  insulin  glargine, 1 Unit Dial , (TOUJEO  SOLOSTAR) 300 UNIT/ML Solostar Pen Inject 40 Units into the skin every evening.    [provider]  lisinopril  (ZESTRIL ) 2.5 MG tablet Take 2.5 mg by mouth daily.    [provider]  metFORMIN  (GLUCOPHAGE -XR) 500 MG 24 hr tablet Take 1,000 mg by mouth 2 (two) times daily. 10/07/19  [provider]  metoprolol  tartrate (LOPRESSOR ) 50 MG tablet Take 1 tablet (50 mg total) by mouth once for 1 dose. Take at 7:00AM the morning of your CT scan if your heart rate is greater than 80 beats per minute. 05/05/23 05/05/23  Boyce Byes, MD  montelukast  (SINGULAIR ) 10 MG tablet Take 10 mg by mouth daily. 08/20/19   [provider]  Omega-3 Fatty Acids (FISH OIL PO) Take 1 tablet by mouth in the morning and at bedtime.    [provider]  pantoprazole  (PROTONIX ) 40 MG tablet Take 1 tablet (40 mg total) by mouth daily. 02/21/23   Wouk, Haynes Lips, MD  Potassium Gluconate 550 MG TABS Take 550 mg by mouth every evening.    [provider]  rosuvastatin  (CRESTOR ) 5 MG tablet Take 5 mg by mouth daily.    [provider]    Family History    Family History  Problem Relation Age of Onset   Bronchitis Mother    Liver cancer Father    Diabetes Sister    She indicated that the status of her mother is unknown. She indicated that the status of her father is unknown. She indicated that the status of her sister is unknown.  Social History    Social History   Socioeconomic History   Marital status: Married    Spouse name: Not on file   Number of children: Not on file   Years of education: Not on file   Highest education level: Not on file  Occupational History   Not on file   Tobacco Use   Smoking status: Never   Smokeless tobacco: Never  Vaping Use   Vaping status: Never Used  Substance and Sexual Activity   Alcohol  use: Yes    Alcohol /week: 3.0 standard drinks of alcohol     Types: 3 Glasses of wine per week    Comment: socially   Drug use: No   Sexual activity: Yes    Birth control/protection: Post-menopausal  Other Topics Concern   Not on file  Social History Narrative   Not on file   Social Drivers of Health   Financial Resource Strain: Not on file  Food Insecurity: No Food Insecurity (03/27/2023)   Hunger Vital Sign    Worried About Running Out of Food in the Last Year: Never true    Ran Out of Food in the Last Year: Never true  Transportation Needs: No Transportation Needs (03/27/2023)   PRAPARE - Administrator, Civil Service (Medical): No    Lack of Transportation (Non-Medical): No  Physical Activity: Not on file  Stress: Not on file  Social Connections: Unknown (03/27/2023)   Social Connection and Isolation Panel    Frequency of Communication with Friends and Family: More than three times a week    Frequency of Social Gatherings with Friends and Family: Once a week    Attends Religious Services: Patient declined    Database administrator or Organizations: No    Attends Banker Meetings: Never    Marital Status: Married  Catering manager Violence: Not At Risk (03/27/2023)   Humiliation, Afraid, Rape, and Kick questionnaire    Fear of Current or Ex-Partner: No    Emotionally Abused: No    Physically Abused: No    Sexually Abused: No     Review of Systems    General:  No chills, fever, night sweats or weight changes.  Cardiovascular:  No chest pain, dyspnea  on exertion, edema, orthopnea, palpitations, paroxysmal nocturnal dyspnea. Dermatological: No rash, lesions/masses Respiratory: No cough, dyspnea Urologic: No hematuria, dysuria Abdominal:   No nausea, vomiting, diarrhea, bright red blood per rectum,  melena, or hematemesis Neurologic:  No visual changes, wkns, changes in mental status. All other systems reviewed and are otherwise negative except as noted above.  Physical Exam    VS:  BP 98/60   Pulse 79   Ht 5' 4 (1.626 m)   Wt 157 lb 6.4 oz (71.4 kg)   SpO2 97%   BMI 27.02 kg/m  , BMI Body mass index is 27.02 kg/m. GEN: Well nourished, well developed, in no acute distress. HEENT: normal. Neck: Supple, no JVD, carotid bruits, or masses. Cardiac: Irregularly irregular, no murmurs, rubs, or gallops. No clubbing, cyanosis, edema.  Radials/DP/PT 2+ and equal bilaterally.  Respiratory:  Respirations regular and unlabored, clear to auscultation bilaterally. GI: Soft, nontender, nondistended, BS + x 4. MS: no deformity or atrophy. Skin: warm and dry, no rash. Neuro:  Strength and sensation are intact. Psych: Normal affect.  Accessory Clinical Findings    Recent Labs: 03/28/2023: Magnesium  1.7 05/24/2023: ALT 8; BUN 15; Creatinine 0.60; Hemoglobin 12.4; Platelet Count 142; Potassium 4.3; Sodium 139   Recent Lipid Panel No results found for: CHOL, TRIG, HDL, CHOLHDL, VLDL, LDLCALC, LDLDIRECT       ECG personally reviewed by me today-none today.     Echocardiogram 06/08/2023  IMPRESSIONS     1. Left ventricular ejection fraction, by estimation, is 60 to 65%. The  left ventricle has normal function. The left ventricle has no regional  wall motion abnormalities. Left ventricular diastolic function could not  be evaluated.   2. Right ventricular systolic function is normal. The right ventricular  size is normal.   3. Left atrial size was severely dilated.   4. Right atrial size was mildly dilated.   5. The mitral valve is abnormal. Mild mitral valve regurgitation. No  evidence of mitral stenosis.   6. The aortic valve is tricuspid. There is mild calcification of the  aortic valve. Aortic valve regurgitation is not visualized. Aortic valve   sclerosis/calcification is present, without any evidence of aortic  stenosis.   7. The inferior vena cava is normal in size with greater than 50%  respiratory variability, suggesting right atrial pressure of 3 mmHg.   FINDINGS   Left Ventricle: Left ventricular ejection fraction, by estimation, is 60  to 65%. The left ventricle has normal function. The left ventricle has no  regional wall motion abnormalities. The left ventricular internal cavity  size was normal in size. There is   no left ventricular hypertrophy. Left ventricular diastolic function  could not be evaluated due to atrial fibrillation. Left ventricular  diastolic function could not be evaluated.   Right Ventricle: The right ventricular size is normal. No increase in  right ventricular wall thickness. Right ventricular systolic function is  normal.   Left Atrium: Left atrial size was severely dilated.   Right Atrium: Right atrial size was mildly dilated.   Pericardium: There is no evidence of pericardial effusion.   Mitral Valve: The mitral valve is abnormal. There is mild calcification of  the mitral valve leaflet(s). Mild mitral valve regurgitation. No evidence  of mitral valve stenosis.   Tricuspid Valve: The tricuspid valve is normal in structure. Tricuspid  valve regurgitation is mild . No evidence of tricuspid stenosis.   Aortic Valve: The aortic valve is tricuspid. There is mild calcification  of the aortic valve. Aortic valve regurgitation is not visualized. Aortic  valve sclerosis/calcification is present, without any evidence of aortic  stenosis.   Pulmonic Valve: The pulmonic valve was normal in structure. Pulmonic valve  regurgitation is not visualized. No evidence of pulmonic stenosis.   Aorta: The aortic root is normal in size and structure.   Venous: The inferior vena cava is normal in size with greater than 50%  respiratory variability, suggesting right atrial pressure of 3 mmHg.    IAS/Shunts: No atrial level shunt detected by color flow Doppler.      Assessment & Plan   1.  Atrial fibrillation-heart rate today 79.  Irregularly irregular not a candidate for anticoagulation due to GI bleed.  Was seen and evaluated by Dr. Marven [name].  Watchman procedure was recommended.  The patient has had pre-Watchman CT.  She is compliant with atorvastatin .  She has no cardiac symptoms.  She denies exertional chest pain.  She may proceed with Watchman procedure without further cardiac testing if she decides she wishes to proceed. Continue metoprolol  Avoid triggers caffeine, chocolate, EtOH, dehydration etc.  Hyperlipidemia-LDL 59 on 09/29/22 High-fiber diet Continue rosuvastatin  Follows with PCP  Essential hypertension-BP today 98/60. At home similar pressure.  Denies lightheadedness, presyncope or syncope Maintain blood pressure log Heart healthy low-sodium diet Continue metoprolol   Disposition: Follow-up with Dr. Maximo [name] or me in 6-9 months.   Chet Cota. Nicholson Starace NP-C     07/22/2023, 8:32 AM [facility] [address] Office [phone] Fax [phone]    I spent 14 minutes examining this patient, reviewing medications, and using patient centered shared decision making involving their cardiac care.   I spent  20 minutes reviewing past medical history,  medications, and prior cardiac tests.

## 2023-07-22 ENCOUNTER — Ambulatory Visit: Attending: General Practice | Admitting: General Practice

## 2023-07-22 ENCOUNTER — Encounter: Payer: Self-pay | Admitting: General Practice

## 2023-07-22 VITALS — BP 98/60 | HR 79 | Ht 64.0 in | Wt 157.4 lb

## 2023-07-22 DIAGNOSIS — I4821 Permanent atrial fibrillation: Secondary | ICD-10-CM | POA: Diagnosis not present

## 2023-07-22 DIAGNOSIS — E782 Mixed hyperlipidemia: Secondary | ICD-10-CM | POA: Insufficient documentation

## 2023-07-22 DIAGNOSIS — I10 Essential (primary) hypertension: Secondary | ICD-10-CM | POA: Diagnosis not present

## 2023-07-22 NOTE — Patient Instructions (Addendum)
 Medication Instructions:  Your physician recommends that you continue on your current medications as directed. Please refer to the Current Medication list given to you today.  *If you need a refill on your cardiac medications before your next appointment, please call your pharmacy*  Lab Work: None ordered.  You may go to any [facility] Location for your lab work:  [facility] - [address] ([facility]) - [address] [phone] [address]  [place] - [address]   [place]  - [address]   [place] - [address] - [address] [place] [employer]  - [address] [place] - [address] ([facility]   If you have labs (blood work) drawn today and your tests are completely normal, you will receive your results only by: [employer] (if you have MyChart)  If you have any lab test that is abnormal or we need to change your treatment, we will call you or send a MyChart message to review the results.  Testing/Procedures: None ordered.  Follow-Up: At [facility], you and your health needs are our priority.  As part of our continuing mission to provide you with exceptional heart care, we have created designated Provider Care Teams.  These Care Teams include your primary Cardiologist (physician) and Advanced Practice Providers (APPs -  Physician Assistants and Nurse Practitioners) who all work together to provide you with the care you need, when you need it.   Your next appointment:   6-9 months  The format for your next appointment:   In Person  Provider:   Lawana Pray,  NP Or Erika [name]

## 2023-08-08 ENCOUNTER — Telehealth: Payer: Self-pay | Admitting: Neurology

## 2023-08-08 ENCOUNTER — Encounter: Payer: Self-pay | Admitting: Neurology

## 2023-08-08 ENCOUNTER — Ambulatory Visit (INDEPENDENT_AMBULATORY_CARE_PROVIDER_SITE_OTHER): Admitting: Neurology

## 2023-08-08 VITALS — BP 116/67 | HR 107 | Ht 64.0 in | Wt 153.0 lb

## 2023-08-08 DIAGNOSIS — R413 Other amnesia: Secondary | ICD-10-CM | POA: Diagnosis not present

## 2023-08-08 MED ORDER — DONEPEZIL HCL 5 MG PO TABS: 5.0000 mg | ORAL_TABLET | Freq: Every day | ORAL | 6 refills | Status: AC

## 2023-08-08 NOTE — Patient Instructions (Addendum)
 MRI of the brain w/wo contrast (look at structure of the brain) Blood work (vitamin Bs, thyroid , genetic testing for alzheimer's gene, looking for alzheimer's protein in the blood Formal neurocognitive testing: Dr. Jenna Refroe Information on the new medications (Lecanemab or Donanemab) Consider: CT Amyloid that can tell us  the burden of amyloid proteins in the brain Follow up after workup above  Alzheimer's Disease Alzheimer's disease is a disease that affects the way your brain works. It affects memory and causes changes in how you think, talk, and act. The disease gets worse over time. Alzheimer's disease is a form of dementia. What are the causes? Alzheimer's disease happens when a protein called beta-amyloid forms deposits in the brain. It's not known what causes these to form. The disease may also be caused by a harmful change in a gene that's passed down, or inherited, from one or both parents. Not everyone who gets the changed gene from their parents will get the disease. What increases the risk? Being older than 70 years of age. Being female. Having any of these conditions: High blood pressure. Diabetes. Heart or blood vessel disease. Smoking. Being very overweight. Having a brain injury or a stroke in the past. Having a family history of dementia. What are the signs or symptoms?  Symptoms may happen in three stages, which often overlap. Early stage In this stage, you can still do things on your own. You may still be able to drive, work, and be social. Symptoms in this stage include: Forgetting small things, like a name, words, or what you did recently. Having a hard time with: Paying attention or learning new things. Talking with people. Doing your usual tasks. Solving problems or doing math. Following instructions. Feeling worried or nervous. Not wanting to be around people. Losing interest in doing things. Moderate stage In this stage, you'll start to need care.  Symptoms include: Trouble saying what you're thinking. Memory loss that affects daily life. This can include forgetting: Things that happened recently. If you've taken medicines or eaten. Places you know. You may get lost while walking or driving. To pay bills. To bathe or use the bathroom. Being confused about where you are or what time it is. Trouble judging distance. Changes in how you feel or act. You may be moody, angry, upset, scared, worried, or suspicious. Not thinking clearly or making good choices. You may have delusions or false beliefs. Hallucinations. This means you see, hear, taste, smell, or feel things that aren't real. Severe stage In the severe stage, you'll need help with your personal care and daily activities. Symptoms include: Memory loss getting worse. Personality changes. Not knowing where you are. Physical problems, like trouble walking, sitting, or swallowing. More trouble talking with others. Not being able to control when you pee (urinate) or poop. More changes in how you act. How is this diagnosed? You may need to see a nervous system specialist, called a neurologist, or a health care provider who focuses on the care of older adults. Alzheimer's disease may be diagnosed based on: Your symptoms and medical history. Your provider will talk with you and your family, friends, and caregivers about your history and symptoms. A physical exam. Tests. These may include: Lab tests, such as tests on your blood or pee. Imaging tests. You may have a CT scan, a PET scan, or an MRI. A lumbar puncture. For this test, a sample of the fluid around the brain and spinal cord is taken and tested. Tests to check your  thinking and memory. Genetic testing. This may be done if you get the disease before age 40 or if other family members have had the disease. How is this treated? At this time, there's no cure for Alzheimer's disease. The goals of treatment are to: Manage symptoms  that affect behavior. Make sure you're safe at home. Help manage daily life for you and your caregivers. Treatment may include: Medicines. You may get medicines that can: Slow down how fast the disease gets worse. Help with memory and behavior. Cognitive therapy. This gives you support, training to help with thinking skills, and memory aids. Counseling or spiritual guidance. These can help you deal with the many feelings you may have, such as fear, anger, or feeling alone. Caregivers. These are people who help you with your daily tasks. Family support groups. These allow your family members to learn about the disease, get emotional support, and find out about resources to help take care of you. Follow these instructions at home:  Medicines Take over-the-counter and prescription medicines only as told by your provider. Use a pill organizer or pill reminder to help you keep track of your medicines. Avoid taking medicines that can affect thinking, such as medicines for pain or sleep. Lifestyle Make healthy choices: Be active as told by your provider. Regular exercise may help with symptoms. Do not smoke, vape, or use products with nicotine or tobacco in them. Do not drink alcohol . Eat a healthy diet. When you feel a lot of stress, do something that helps you relax. Try things like yoga or deep breathing. Spend time with people. Drink enough fluid to keep your pee pale yellow. Make sure you sleep well. These tips can help: Try not to take long naps during the day. Take short naps of 30 minutes or less if needed. Keep your bedroom dark and cool. Do not exercise during the few hours before you go to bed. Avoid caffeine products in the afternoon and evening. General instructions Work with your provider to decide: What things you need help with. What your safety needs are. If you were given a bracelet that tracks where you are and shows that you're a person with memory loss, make sure you  wear it at all times. Talk with your provider about whether it's safe for you to drive. Work with your family to make big legal or health decisions. This may include things like advance directives, medical power of attorney, or a living will. Where to find more information There are two ways to contact the Alzheimer[employer]: Call the 24-hour helpline at (548)194-7893. Visit [employer] Contact a health care provider if: Your medicine causes you to have nausea, vomiting, or trouble with eating. You have mood or behavior changes that are getting worse, such as feeling depressed, worried, or nervous. You have hallucinations. You or your family members are worried about your safety. You're hard to wake up. Your memory suddenly gets worse. Get help right away if: You feel like you may hurt yourself or others. You have thoughts about taking your own life. Take one of these steps: Go to your nearest emergency room. Call 911. Call the National Suicide Prevention Lifeline at 512-060-7452 or 988. Text the Crisis Text Line at (802)428-0382. This information is not intended to replace advice given to you by your health care provider. Make sure you discuss any questions you have with your health care provider. Document Revised: 04/27/2022 Document Reviewed: 04/27/2022  Donepezil Tablets What is this medication? DONEPEZIL (doe NEP  e zil) treats memory loss and confusion (dementia) in people who have Alzheimer disease. It works by improving attention, memory, and the ability to engage in daily activities. It is not a cure for dementia or Alzheimer disease. This medicine may be used for other purposes; ask your health care provider or pharmacist if you have questions. COMMON BRAND NAME(S): Aricept What should I tell my care team before I take this medication? They need to know if you have any of these conditions: Head injury Heart disease Irregular heartbeat or rhythm Liver disease Lung or  breathing disease, such as asthma Seizures Stomach ulcers, other stomach or intestine problems Stomach bleeding Trouble passing urine An unusual or allergic reaction to donepezil, other medications, foods, dyes, or preservatives Pregnant or trying to get pregnant Breastfeeding How should I use this medication? Take this medication by mouth with a glass of water . Follow the directions on the prescription label. You may take this medication with or without food. Take this medication at regular intervals. This medication is usually taken before bedtime. Do not take it more often than directed. Continue to take your medication even if you feel better. Do not stop taking except on your care team's advice. If you are taking the 23 mg donepezil tablet, swallow it whole; do not cut, crush, or chew it. Talk to your care team about the use of this medication in children. Special care may be needed. Overdosage: If you think you have taken too much of this medicine contact a poison control center or emergency room at once. NOTE: This medicine is only for you. Do not share this medicine with others. What if I miss a dose? If you miss a dose, take it as soon as you can. If it is almost time for your next dose, take only that dose, do not take double or extra doses. What may interact with this medication? Do not take this medication with any of the following: Certain medications for fungal infections, such as itraconazole, fluconazole, posaconazole, voriconazole Cisapride Dextromethorphan; quinidine Dronedarone Pimozide Quinidine Thioridazine This medication may also interact with the following: Antihistamines for allergy, cough, and cold Atropine Bethanechol Carbamazepine Certain medications for bladder problems, such as oxybutynin or tolterodine Certain medications for Parkinson disease, such as benztropine or trihexyphenidyl Certain medications for stomach problems, such as dicyclomine or  hyoscyamine Certain medications for travel sickness, such as scopolamine  Dexamethasone  Dofetilide Ipratropium NSAIDs, medications for pain and inflammation, such as ibuprofen or naproxen Other medications for Alzheimer disease Other medications that cause heart rhythm changes Phenobarbital Phenytoin Rifampin, rifabutin, or rifapentine Ziprasidone This list may not describe all possible interactions. Give your health care provider a list of all the medicines, herbs, non-prescription drugs, or dietary supplements you use. Also tell them if you smoke, drink alcohol , or use illegal drugs. Some items may interact with your medicine. What should I watch for while using this medication? Visit your care team for regular checks on your progress. Tell your care team if your symptoms do not start to get better or if they get worse. This medication may affect your coordination, reaction time, or judgment. Do not drive or operate machinery until you know how this medication affects you. Sit up or stand slowly to reduce the risk of dizzy or fainting spells. Drinking alcohol  with this medication can increase the risk of these side effects. What side effects may I notice from receiving this medication? Side effects that you should report to your care team as soon as possible:  Allergic reactions--skin rash, itching, hives, swelling of the face, lips, tongue, or throat Peptic ulcer--burning stomach pain, loss of appetite, bloating, burping, heartburn, nausea, vomiting Seizures Slow heartbeat--dizziness, feeling faint or lightheaded, confusion, trouble breathing, unusual weakness or fatigue Stomach bleeding--bloody or black, tar-like stools, vomiting blood or brown material that looks like coffee grounds Trouble passing urine Side effects that usually do not require medical attention (report these to your care team if they continue or are bothersome): Diarrhea Fatigue Loss of appetite Muscle pain or  cramps Nausea Trouble sleeping This list may not describe all possible side effects. Call your doctor for medical advice about side effects. You may report side effects to FDA at 1-800-FDA-1088. Where should I keep my medication? Keep out of reach of children. Store at room temperature between 15 and 30 degrees C (59 and 86 degrees F). Throw away any unused medication after the expiration date. NOTE: This sheet is a summary. It may not cover all possible information. If you have questions about this medicine, talk to your doctor, pharmacist, or health care provider.  2024 Elsevier/Gold Standard (2022-08-05 00:00:00) Elsevier Patient Education  2024 [employer].

## 2023-08-08 NOTE — Telephone Encounter (Signed)
 Referral for neuropsychology fax to [facility]. Phone: [phone], Fax; [phone].

## 2023-08-08 NOTE — Progress Notes (Signed)
 [facility]    Provider:  Dr Ines Requesting Provider: Cleotilde, Wash BRAVO, PA Primary Care Provider:  Cleotilde, [place]  E, PA  CC:  Memory loss  HPI:  Erika [name] is a 68 y.o. female here as requested by Cleotilde, [place]  E, PA for memory changes. has Abnormal EKG; Hyperlipidemia; Diabetes mellitus (HCC); Osteoarthritis of right knee; Knee osteoarthritis; Cystocele; Atrial fibrillation (HCC); Diabetic peripheral neuropathy associated with type 2 diabetes mellitus (HCC); Granulocytosis; Encounter for coordination of complex care; Monoallelic mutation of CALR3 gene; Acute bleeding; Atrial fibrillation with RVR (HCC); Enteritis; Hypomagnesemia; Left adrenal mass (HCC); and Dehydration on their problem list.  Per review of Dr. Dianne notes she also has a past medical history of GERD, generalized anxiety, type 2 diabetes, vitamin D deficiency, obstructive sleep apnea using CPAP very effectively, osteoarthritis, on long-term use of insulin , chronic maxillary sinusitis, hyperlipidemia, diabetic peripheral neuropathy, follows with Dr. Mona has been referred for evaluation for Watchman device per note added to chart on April 06, 2023  She is here with daughter who provides information. She states she forget why she walks into a room often, she feels more short-term memory, she remembers the things more in the past, started noticing years ago but last year daughter states she was concerned last summer when patient kept calling her and sice then progressively worsening. She went to [place]  to see her sister last fall and daughter was worried she would get lost in the airport. She gets lost when driving but that is not knew she is terrible with directions, she is not unsafe, no accidents in th car or in the home, she lives with her husband and daughter, other people have noticed, her sisters and husband have noticed. Mother had dementia unclear what type but sounds like more alzheimer's, she  has a new dog for 3 months and still calls it by the ld dog name, she did not remember her husband's birthday. She can shop, cook, clean but doesn't want to. She asks the same questions in the same day over and over again. She has bad anxiety but not depression. Not socializing.    Reviewed notes, labs and imaging from outside physicians, which showed:  I reviewed [place]  Miller's notes, date of service April 06, 2023, she was recently hospitalized from February 16 February 17th 2025 for enteritis and A-fib, she was not on anticoagulation due to history of GI bleed, she presented to [facility] complaining of lower abdominal pain and associated nausea, she was also admitted a month prior for symptomatic anemia secondary to GI bleed, she was vomiting, diagnosed with enteritis, also paroxysmal A-fib with RVR, also depression and anxiety, her last A1c was 9.6 in February 2024.  No specifics included in history on symptoms of memory loss  May 24, 2023 CBC showed elevated white blood cells and low platelet count 142 otherwise unremarkable, CMP showed elevated glucose otherwise normal, vitamin B12 normal 324 and MMA normal, folate normal,  March 27, 2023: Hemoglobin A1c 8.7 elevated, uncontrolled  October 08, 2022 RPR nonreactive  November 13, 2021 TSH normal  02/15/2023: CT head CLINICAL DATA:  Mental status change, unknown cause   EXAM: CT HEAD WITHOUT CONTRAST personally reviewe dimages and agree with the following:   TECHNIQUE: Contiguous axial images were obtained from the base of the skull through the vertex without intravenous contrast.   RADIATION DOSE REDUCTION: This exam was performed according to the departmental dose-optimization program which includes automated exposure control, adjustment of the mA  and/or kV according to patient size and/or use of iterative reconstruction technique.   COMPARISON:  None Available.   FINDINGS: Brain: There is atrophy and chronic  small vessel disease changes. No acute intracranial abnormality. Specifically, no hemorrhage, hydrocephalus, mass lesion, acute infarction, or significant intracranial injury.   Vascular: No hyperdense vessel or unexpected calcification.   Skull: No acute calvarial abnormality.   Sinuses/Orbits: No acute findings   Other: None   IMPRESSION: Atrophy, chronic microvascular disease.  Review of Systems: Patient complains of symptoms per HPI as well as the following symptoms anxiety. Pertinent negatives and positives per HPI. All others negative.   Social History   Socioeconomic History   Marital status: Married    Spouse name: Not on file   Number of children: Not on file   Years of education: Not on file   Highest education level: Not on file  Occupational History   Not on file  Tobacco Use   Smoking status: Never   Smokeless tobacco: Never  Vaping Use   Vaping status: Never Used  Substance and Sexual Activity   Alcohol  use: Yes    Alcohol /week: 1.0 standard drink of alcohol     Types: 1 Glasses of wine per week    Comment: socially   Drug use: No   Sexual activity: Yes    Birth control/protection: Post-menopausal  Other Topics Concern   Not on file  Social History Narrative   Pt lives with family    Retired    Chief Executive Officer [employer] investment banker Strain: Not on file  Food Insecurity: No Food Insecurity (03/27/2023)   Hunger Vital Sign    Worried About Running Out of Food in the Last Year: Never true    Ran Out of Food in the Last Year: Never true  Transportation Needs: No Transportation Needs (03/27/2023)   PRAPARE - Administrator, Civil Service (Medical): No    Lack of Transportation (Non-Medical): No  Physical Activity: Not on file  Stress: Not on file  Social Connections: Unknown (03/27/2023)   Social Connection and Isolation Panel    Frequency of Communication with Friends and Family: More than three times a week    Frequency of  Social Gatherings with Friends and Family: Once a week    Attends Religious Services: Patient declined    Database administrator or Organizations: No    Attends Banker Meetings: Never    Marital Status: Married  Catering manager Violence: Not At Risk (03/27/2023)   Humiliation, Afraid, Rape, and Kick questionnaire    Fear of Current or Ex-Partner: No    Emotionally Abused: No    Physically Abused: No    Sexually Abused: No    Family History  Problem Relation Age of Onset   Bronchitis Mother    Memory loss Mother    Liver cancer Father    Diabetes Sister    Memory loss Sister     Past Medical History:  Diagnosis Date   Anemia    just related to fibroids   Anxiety    takes Clonazepam  daily as needed   Arthritis    wrists; neck (04/10/2013)   Basal cell carcinoma of nasal tip    Cataract    bilateral   Cramping of feet    takes Potassium daily   Dysphagia    History of colon polyps    Hyperlipidemia    takes Crestor  nightly because I'm a diabetic   Hypertension  Insomnia    takes Benadryl  nightly   Joint pain    Joint swelling    Neuromuscular disorder (HCC)    feet   Neuropathy    both feet   Osteoarthritis of right knee 04/10/2013   Seasonal allergies    takes Omnaris and Claritin  daily as needed   Sleep apnea    SVD (spontaneous vaginal delivery)    x 1   Type II diabetes mellitus (HCC)    takes Janumet  daily   Urinary frequency     Patient Active Problem List   Diagnosis Date Noted   Atrial fibrillation with RVR (HCC) 03/27/2023   Enteritis 03/27/2023   Hypomagnesemia 03/27/2023   Left adrenal mass (HCC) 03/27/2023   Dehydration 03/27/2023   Acute bleeding 02/15/2023   Monoallelic mutation of CALR3 gene 07/09/2022   Encounter for coordination of complex care 12/03/2021   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   Diabetic peripheral neuropathy associated with type 2 diabetes mellitus (HCC) 03/26/2021   Atrial fibrillation (HCC) 10/23/2019    Cystocele 02/06/2014   Osteoarthritis of right knee 04/10/2013   Knee osteoarthritis 04/10/2013   Hyperlipidemia 03/23/2013   Diabetes mellitus (HCC) 03/23/2013   Abnormal EKG     Past Surgical History:  Procedure Laterality Date   ABDOMINAL HYSTERECTOMY     ANTERIOR AND POSTERIOR REPAIR N/A 02/06/2014   Procedure: ANTERIOR (CYSTOCELE) AND POSTERIOR REPAIR (RECTOCELE);  Surgeon: Krystal Deaner, MD;  Location: [facility];  Service: Gynecology;  Laterality: N/A;   BONE SPURS Left 10/09/1968   ankle   CLOSED REDUCTION NASAL FRACTURE  ~ 2012   COLONOSCOPY     HYSTEROSCOPY WITH RESECTOSCOPE  02/08/2002   w/fibroids removed   JOINT REPLACEMENT     right knee   MELANOMA EXCISION  02/09/2012   tip of nose   TONSILLECTOMY  ~ 1959   TOTAL KNEE ARTHROPLASTY Right 04/10/2013   TOTAL KNEE ARTHROPLASTY Right 04/10/2013   Procedure: TOTAL KNEE ARTHROPLASTY;  Surgeon: Fonda SHAUNNA Olmsted, MD;  Location: [facility];  Service: Orthopedics;  Laterality: Right;   VAGINAL HYSTERECTOMY N/A 02/06/2014   Procedure: HYSTERECTOMY VAGINAL;  Surgeon: Krystal Deaner, MD;  Location: [facility];  Service: Gynecology;  Laterality: N/A;    Current Outpatient Medications  Medication Sig Dispense Refill   ALPRAZolam  (XANAX ) 0.25 MG tablet Take 0.25 mg by mouth at bedtime as needed.     ARTIFICIAL TEARS 0.1-0.3 % SOLN Place 2 drops into both eyes daily as needed (for dry eye).     Calcium  Carb-Cholecalciferol 600-800 MG-UNIT TABS Take 1 tablet by mouth daily.     cetirizine (ZYRTEC) 10 MG tablet Take 10 mg by mouth daily.     DILT-XR 120 MG 24 hr capsule Take 120 mg by mouth daily.     donepezil (ARICEPT) 5 MG tablet Take 1 tablet (5 mg total) by mouth at bedtime. 30 tablet 6   escitalopram  (LEXAPRO ) 10 MG tablet Take 1 tablet by mouth daily.     ferrous sulfate  325 (65 FE) MG EC tablet Take 1 tablet (325 mg total) by mouth daily. 30 tablet PRN   insulin  glargine, 1 Unit Dial , (TOUJEO  SOLOSTAR) 300 UNIT/ML Solostar Pen  Inject 40 Units into the skin every evening.     lisinopril  (ZESTRIL ) 2.5 MG tablet Take 2.5 mg by mouth daily.     metFORMIN  (GLUCOPHAGE -XR) 500 MG 24 hr tablet Take 1,000 mg by mouth 2 (two) times daily.     metoprolol  tartrate (LOPRESSOR ) 50 MG tablet Take 1 tablet (50  mg total) by mouth once for 1 dose. Take at 7:00AM the morning of your CT scan if your heart rate is greater than 80 beats per minute. 1 tablet 0   montelukast  (SINGULAIR ) 10 MG tablet Take 10 mg by mouth daily.     MOUNJARO 5 MG/0.5ML Pen SMARTSIG:5 Milligram(s) SUB-Q Once a Week     Omega-3 Fatty Acids (FISH OIL PO) Take 1 tablet by mouth in the morning and at bedtime.     pantoprazole  (PROTONIX ) 40 MG tablet Take 1 tablet (40 mg total) by mouth daily. 30 tablet 1   Potassium Gluconate 550 MG TABS Take 550 mg by mouth every evening.     rosuvastatin  (CRESTOR ) 5 MG tablet Take 5 mg by mouth daily.     No current facility-administered medications for this visit.    Allergies as of 08/08/2023 - Review Complete 08/08/2023  Allergen Reaction Noted   Amoxicillin Hives 03/23/2013   Amoxicillin-pot clavulanate Hives 03/27/2023   Fluticasone propionate Other (See Comments) 06/04/2021   Nsaids Nausea And Vomiting 03/28/2013   Sulfa antibiotics Nausea And Vomiting 03/27/2023   Tetanus toxoids Other (See Comments) 03/23/2013   Tramadol  hcl Other (See Comments) 06/04/2021    Vitals: BP 116/67   Pulse (!) 107   Ht 5' 4 (1.626 m)   Wt 153 lb (69.4 kg)   BMI 26.26 kg/m  Last Weight:  Wt Readings from Last 1 Encounters:  08/08/23 153 lb (69.4 kg)   Last Height:   Ht Readings from Last 1 Encounters:  08/08/23 5' 4 (1.626 m)     Physical exam: Exam: Gen: NAD, conversant, well nourised, well groomed                     CV: tacycardic, irregular, no MRG. No Carotid Bruits. No peripheral edema, warm, nontender Eyes: Conjunctivae clear without exudates or hemorrhage  Neuro: Detailed Neurologic Exam  Speech:     Speech is normal; fluent and spontaneous with normal comprehension.  Cognition:     08/08/2023   11:27 AM  MMSE - Mini Mental State Exam  Orientation to time 0  Orientation to Place 5  Registration 3  Attention/ Calculation 1  Recall 1  Language- name 2 objects 2  Language- repeat 1  Language- follow 3 step command 3  Language- read & follow direction 1  Write a sentence 1  Copy design 0  Total score 18   Cranial Nerves:    The pupils are equal, round, and reactive to light. Attempted fundoscopic exam pupils too small to visualize. Visual fields are full to finger confrontation. Extraocular movements are intact. Trigeminal sensation is intact and the muscles of mastication are normal. The face is symmetric. The palate elevates in the midline. Hearing intact. Voice is normal. Shoulder shrug is normal. The tongue has normal motion without fasciculations.   Coordination:    Normal finger to nose   Gait: Slightly low clearance but no ataxia or dysmetria  Motor Observation:    No asymmetry, no atrophy, and no involuntary movements noted. Tone:    Normal muscle tone.    Posture:    Posture is normal. normal erect    Strength:    Strength is V/V in the upper and lower limbs.      Sensation: intact to LT     Reflex Exam:  DTR's:    Deep tendon reflexes in the upper and lower extremities are brisk bilaterally for age and medical condition  Toes:    The toes are downgoing bilaterally.   Clonus:    Clonus is absent.   No frontal release signs  Assessment/Plan:  Lovely pleasant patient ere with daughter. Mother with dementia, sounds like FHx Alzheimer's disesae, mmse 18/30, concerning for alzheimer's dementia.  MRI of the brain w/wo contrast (look at structure of the brain) Blood work (vitamin Bs, thyroid , genetic testing for alzheimer's gene, looking for alzheimer's protein in the blood Formal neurocognitive testing: Dr. Jenna Refroe Information on the new medications  (Lecanemab or Donanemab) Consider: CT Amyloid that can tell us  the burden of amyloid proteins in the brain Follow up after workup above 4-5 months Start aricept Send results in [place] and also call mother with results  Orders Placed This Encounter  Procedures   MR BRAIN W WO CONTRAST   APOE Alzheimer's Risk   ATN PROFILE   Basic Metabolic Panel   TSH Rfx on Abnormal to Free T4   Vitamin B1   Ambulatory referral to Neuropsychology   Meds ordered this encounter  Medications   donepezil (ARICEPT) 5 MG tablet    Sig: Take 1 tablet (5 mg total) by mouth at bedtime.    Dispense:  30 tablet    Refill:  6    Cc: Cleotilde, [place]  E, PA,  Miller, [place]  E, PA  Onetha Epp, MD  [facility] [address] [place], [place] [address]  Phone [phone] Fax [phone]

## 2023-08-08 NOTE — Telephone Encounter (Signed)
 no auth required sent to GI [phone]

## 2023-08-11 LAB — ATN PROFILE
A -- Beta-amyloid 42/40 Ratio: 0.095 — AB (ref >0.102)
Beta-amyloid 40: 200.25 pg/mL
Beta-amyloid 42: 19.01 pg/mL
N -- NfL, Plasma: 3.38 pg/mL (ref 0.00–3.65)
T -- p-tau181: 1.17 pg/mL — AB (ref 0.00–0.97)

## 2023-08-18 LAB — BASIC METABOLIC PANEL WITH GFR
BUN/Creatinine Ratio: 36 — ABNORMAL HIGH (ref 12–28)
BUN: 20 mg/dL (ref 8–27)
CO2: 21 mmol/L (ref 20–29)
Calcium: 9.2 mg/dL (ref 8.7–10.3)
Chloride: 99 mmol/L (ref 96–106)
Creatinine, Ser: 0.55 mg/dL — ABNORMAL LOW (ref 0.57–1.00)
Glucose: 181 mg/dL — ABNORMAL HIGH (ref 70–99)
Potassium: 4.4 mmol/L (ref 3.5–5.2)
Sodium: 136 mmol/L (ref 134–144)
eGFR: 99 mL/min/1.73 (ref >59)

## 2023-08-18 LAB — APOE ALZHEIMER'S RISK

## 2023-08-18 LAB — VITAMIN B1: Thiamine: 158.4 nmol/L (ref 66.5–200.0)

## 2023-08-18 LAB — TSH RFX ON ABNORMAL TO FREE T4: TSH: 2.39 u[IU]/mL (ref 0.450–4.500)

## 2023-08-18 NOTE — Telephone Encounter (Signed)
 Received Fax from [facility] stating  Unfortunate . We  were unable to schedule your PT.  Multiple attempts were made to contact the PT by phone/voicemail. Pt has not returned or answered our calls.  Please contact [facility] again if this Pt would like  to schedule with our office.   Called Pt , No answer, LVM to give callback about referral

## 2023-08-18 NOTE — Telephone Encounter (Signed)
 Pt daughter Jannett [facility] ) called inform daughter that   [facility] tried to to contact PT  to schedule appt . Daughter explained  Pt husband is in hospital at the time that why Pt is hard to reach  for future appt please call Pt Daughter [phone] ) . Daughter will call to schedule Pt appt  with [facility]

## 2023-08-18 NOTE — Telephone Encounter (Signed)
 Received Fax From [facility]  stating  Your Pt  has been scheduled to see . Dr, Shila on the following  dates: Intake /Testing :August 21,2025 at 9:00 Am  Interview Marthe :September 9,2025 3:00 pm Requested to fax over :Last 3 clinic notes -CT/MRI- H&P-Medication list    **Faxed over document to  [phone]

## 2023-08-22 ENCOUNTER — Telehealth: Payer: Self-pay | Admitting: Neurology

## 2023-08-22 ENCOUNTER — Ambulatory Visit: Payer: Self-pay | Admitting: Neurology

## 2023-08-22 NOTE — Telephone Encounter (Signed)
 Relayed the results of her labs and testing needing if she wanted to proceed forward. I sent her message on mychart as well.  She wrote things down, and will call back if needed.  She appreciated call and verbalized understanding.

## 2023-08-22 NOTE — Telephone Encounter (Signed)
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  I encouraged her to go forward with MRI of the brain as well as the formal  neurocognitive testing with Jenna Renfro.  If she is amenable, we can also order a confirmatory test which is a a CT PET scan that shows us  the amount, if any, of Alzheimer's proteins in the brain  and then they can make better decisions on future treatment.  Thank you.  See phone note. ----- Message ----- From: Rebecka Memos Lab Results In Sent: 08/09/2023   7:37 AM EDT To: Onetha KATHEE Epp, MD

## 2023-08-22 NOTE — Telephone Encounter (Signed)
 Please see other phone message.  This was done.

## 2023-08-22 NOTE — Telephone Encounter (Signed)
 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  I encouraged her to go forward with MRI of the brain as well as the formal neurocognitive testing with Jenna Renfro.  If she is amenable, we can also order a confirmatory test which is a a CT PET scan that shows us  the amount, if any, of Alzheimer's proteins in the brain and then they can make better decisions on future treatment.

## 2023-08-22 NOTE — Progress Notes (Signed)
 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  I encouraged her to go forward with MRI of the brain as well as the formal neurocognitive testing with Jenna Renfro.  If she is amenable, we can also order a confirmatory test which is a a CT PET scan that shows us  the amount, if any, of Alzheimer's proteins in the brain and then they can make better decisions on future treatment.  Thank you.  See phone note.

## 2023-09-06 ENCOUNTER — Encounter (INDEPENDENT_AMBULATORY_CARE_PROVIDER_SITE_OTHER): Payer: Self-pay | Admitting: Neurology

## 2023-09-06 DIAGNOSIS — Z0289 Encounter for other administrative examinations: Secondary | ICD-10-CM | POA: Diagnosis not present

## 2023-09-07 ENCOUNTER — Encounter: Payer: Self-pay | Admitting: *Deleted

## 2023-09-07 NOTE — Telephone Encounter (Signed)
 Please see the MyChart message reply(ies) for my assessment and plan.    This patient gave consent for this Medical Advice Message and is aware that it may result in a bill to [employer], as well as the possibility of receiving a bill for a co-payment or deductible. They are an established patient, but are not seeking medical advice exclusively about a problem treated during an in person or video visit in the last seven days. I did not recommend an in person or video visit within seven days of my reply.    I spent a total of 5 minutes cumulative time within 7 days through [employer].  Anson Fret, MD

## 2023-10-17 ENCOUNTER — Other Ambulatory Visit: Payer: Self-pay | Admitting: Internal Medicine

## 2023-10-17 DIAGNOSIS — Z1231 Encounter for screening mammogram for malignant neoplasm of breast: Secondary | ICD-10-CM

## 2023-10-20 ENCOUNTER — Inpatient Hospital Stay
Admission: RE | Admit: 2023-10-20 | Discharge: 2023-10-20 | Discharge status: Home / Self Care | Source: Ambulatory Visit

## 2023-10-20 DIAGNOSIS — Z1231 Encounter for screening mammogram for malignant neoplasm of breast: Secondary | ICD-10-CM

## 2023-11-22 ENCOUNTER — Other Ambulatory Visit: Payer: Self-pay | Admitting: Physician Assistant

## 2023-11-22 DIAGNOSIS — D72829 Elevated white blood cell count, unspecified: Secondary | ICD-10-CM

## 2023-11-22 DIAGNOSIS — D500 Iron deficiency anemia secondary to blood loss (chronic): Secondary | ICD-10-CM

## 2023-11-23 ENCOUNTER — Other Ambulatory Visit: Payer: Self-pay

## 2023-11-23 ENCOUNTER — Ambulatory Visit: Payer: Self-pay | Admitting: Physician Assistant

## 2023-11-23 ENCOUNTER — Inpatient Hospital Stay: Admitting: Physician Assistant

## 2023-11-23 ENCOUNTER — Other Ambulatory Visit: Payer: Self-pay | Admitting: *Deleted

## 2023-11-23 ENCOUNTER — Inpatient Hospital Stay: Attending: Physician Assistant

## 2023-11-23 VITALS — BP 106/58 | HR 60 | Temp 97.2°F | Resp 17 | Wt 154.8 lb

## 2023-11-23 DIAGNOSIS — D500 Iron deficiency anemia secondary to blood loss (chronic): Secondary | ICD-10-CM

## 2023-11-23 DIAGNOSIS — D72829 Elevated white blood cell count, unspecified: Secondary | ICD-10-CM

## 2023-11-23 DIAGNOSIS — Z800 Family history of malignant neoplasm of digestive organs: Secondary | ICD-10-CM | POA: Insufficient documentation

## 2023-11-23 DIAGNOSIS — D696 Thrombocytopenia, unspecified: Secondary | ICD-10-CM | POA: Insufficient documentation

## 2023-11-23 DIAGNOSIS — G4733 Obstructive sleep apnea (adult) (pediatric): Secondary | ICD-10-CM | POA: Diagnosis not present

## 2023-11-23 DIAGNOSIS — Z862 Personal history of diseases of the blood and blood-forming organs and certain disorders involving the immune mechanism: Secondary | ICD-10-CM | POA: Diagnosis not present

## 2023-11-23 LAB — CBC WITH DIFFERENTIAL (CANCER CENTER ONLY)
Abs Immature Granulocytes: 0.05 K/uL (ref 0.00–0.07)
Basophils Absolute: 0.1 K/uL (ref 0.0–0.1)
Basophils Relative: 1 %
Eosinophils Absolute: 0.1 K/uL (ref 0.0–0.5)
Eosinophils Relative: 1 %
HCT: 39.6 % (ref 36.0–46.0)
Hemoglobin: 12.9 g/dL (ref 12.0–15.0)
Immature Granulocytes: 0 %
Lymphocytes Relative: 12 %
Lymphs Abs: 1.8 K/uL (ref 0.7–4.0)
MCH: 29.4 pg (ref 26.0–34.0)
MCHC: 32.6 g/dL (ref 30.0–36.0)
MCV: 90.2 fL (ref 80.0–100.0)
Monocytes Absolute: 0.8 K/uL (ref 0.1–1.0)
Monocytes Relative: 5 %
Neutro Abs: 12.1 K/uL — ABNORMAL HIGH (ref 1.7–7.7)
Neutrophils Relative %: 81 %
Platelet Count: 110 K/uL — ABNORMAL LOW (ref 150–400)
RBC: 4.39 MIL/uL (ref 3.87–5.11)
RDW: 14.1 % (ref 11.5–15.5)
WBC Count: 15 K/uL — ABNORMAL HIGH (ref 4.0–10.5)
nRBC: 0 % (ref 0.0–0.2)

## 2023-11-23 LAB — CMP (CANCER CENTER ONLY)
ALT: 6 U/L (ref 0–44)
AST: 10 U/L — ABNORMAL LOW (ref 15–41)
Albumin: 4 g/dL (ref 3.5–5.0)
Alkaline Phosphatase: 66 U/L (ref 38–126)
Anion gap: 5 (ref 5–15)
BUN: 15 mg/dL (ref 8–23)
CO2: 29 mmol/L (ref 22–32)
Calcium: 10.5 mg/dL — ABNORMAL HIGH (ref 8.9–10.3)
Chloride: 104 mmol/L (ref 98–111)
Creatinine: 0.59 mg/dL (ref 0.44–1.00)
GFR, Estimated: >60 mL/min (ref >60)
Glucose, Bld: 131 mg/dL — ABNORMAL HIGH (ref 70–99)
Potassium: 4.3 mmol/L (ref 3.5–5.1)
Sodium: 138 mmol/L (ref 135–145)
Total Bilirubin: 0.5 mg/dL (ref 0.0–1.2)
Total Protein: 7.2 g/dL (ref 6.5–8.1)

## 2023-11-23 LAB — FERRITIN: Ferritin: 31 ng/mL (ref 11–307)

## 2023-11-23 LAB — IRON AND IRON BINDING CAPACITY (CC-WL,HP ONLY)
Iron: 50 ug/dL (ref 28–170)
Saturation Ratios: 14 % (ref 10.4–31.8)
TIBC: 349 ug/dL (ref 250–450)
UIBC: 299 ug/dL (ref 148–442)

## 2023-11-23 LAB — VITAMIN B12: Vitamin B-12: >4000 pg/mL — ABNORMAL HIGH (ref 180–914)

## 2023-11-23 NOTE — Progress Notes (Signed)
 [facility] [phone]   [phone]  PROGRESS NOTE  Patient Care Team: Cleotilde, Virginia  E, PA as PCP - General (Internal Medicine) Mona Vinie BROCKS, MD as PCP - Cardiology (Cardiology) Cindie Ole DASEN, MD as PCP - Electrophysiology (Cardiology) Margaret Eduard SAUNDERS, MD as Consulting Physician (Neurology)  Hematological/Oncological History # Leukocytosis, Neutrophilia # OSA not on CPAP #CSF3R mutation 11/13/2021: establish care with Dr. Bernie. WBC 11.3, Hgb 14.2, MCV 94.8, Plt 177 10/08/2022: last visit with Dr. Bernie  11/04/2022: Bone marrow biopsy showed hypercellular bone marrow with otherwise orderly trilineage hematopoiesis in the background of an absolute neutrophilia  11/18/2022: establish care with Dr. Federico   Interval History:  Erika [name] 68 y.o. female with medical history significant for leukocytosis who presents for a follow up visit. The patient's last visit was on 05/24/23.. In the interim since the last visit, she is undergoing workup for Alzheimer's diagnosis.  On exam today Erika [name] is accompanied by her daughter.  Her energy and appetite are fairly stable.  She can do her daily activities on her own.  She is undergoing evaluation with neurology for Alzheimer's diagnosis.  She denies any recent infections or infectious symptoms such as fevers, chills, cough, urinary symptoms.  Her bowel habits are regular and denies any nausea or vomiting.  She denies easy bruising or signs of active bleeding.  She has no other complaints.  Full 10 point ROS is otherwise negative.  MEDICAL HISTORY:  Past Medical History:  Diagnosis Date   Anemia    just related to fibroids   Anxiety    takes Clonazepam  daily as needed   Arthritis    wrists; neck (04/10/2013)   Basal cell carcinoma of nasal tip    Cataract    bilateral   Cramping of feet    takes Potassium daily   Dysphagia    History of colon polyps    Hyperlipidemia     takes Crestor  nightly because I'm a diabetic   Hypertension    Insomnia    takes Benadryl  nightly   Joint pain    Joint swelling    Neuromuscular disorder (HCC)    feet   Neuropathy    both feet   Osteoarthritis of right knee 04/10/2013   Seasonal allergies    takes Omnaris and Claritin  daily as needed   Sleep apnea    SVD (spontaneous vaginal delivery)    x 1   Type II diabetes mellitus (HCC)    takes Janumet  daily   Urinary frequency     SURGICAL HISTORY: Past Surgical History:  Procedure Laterality Date   ABDOMINAL HYSTERECTOMY     ANTERIOR AND POSTERIOR REPAIR N/A 02/06/2014   Procedure: ANTERIOR (CYSTOCELE) AND POSTERIOR REPAIR (RECTOCELE);  Surgeon: Krystal Deaner, MD;  Location: [facility];  Service: Gynecology;  Laterality: N/A;   BONE SPURS Left 10/09/1968   ankle   CLOSED REDUCTION NASAL FRACTURE  ~ 2012   COLONOSCOPY     HYSTEROSCOPY WITH RESECTOSCOPE  02/08/2002   w/fibroids removed   JOINT REPLACEMENT     right knee   MELANOMA EXCISION  02/09/2012   tip of nose   TONSILLECTOMY  ~ 1959   TOTAL KNEE ARTHROPLASTY Right 04/10/2013   TOTAL KNEE ARTHROPLASTY Right 04/10/2013   Procedure: TOTAL KNEE ARTHROPLASTY;  Surgeon: Fonda SHAUNNA Olmsted, MD;  Location: [facility];  Service: Orthopedics;  Laterality: Right;   VAGINAL HYSTERECTOMY N/A 02/06/2014   Procedure: HYSTERECTOMY VAGINAL;  Surgeon: Liz Claiborne,  MD;  Location: [facility];  Service: Gynecology;  Laterality: N/A;    SOCIAL HISTORY: Social History   Socioeconomic History   Marital status: Married    Spouse name: Not on file   Number of children: Not on file   Years of education: Not on file   Highest education level: Not on file  Occupational History   Not on file  Tobacco Use   Smoking status: Never   Smokeless tobacco: Never  Vaping Use   Vaping status: Never Used  Substance and Sexual Activity   Alcohol  use: Yes    Alcohol /week: 1.0 standard drink of alcohol     Types: 1 Glasses of wine per  week    Comment: socially   Drug use: No   Sexual activity: Yes    Birth control/protection: Post-menopausal  Other Topics Concern   Not on file  Social History Narrative   Pt lives with family    Retired    Chief Executive Officer [employer] investment banker Strain: Not on file  Food Insecurity: No Food Insecurity (03/27/2023)   Hunger Vital Sign    Worried About Running Out of Food in the Last Year: Never true    Ran Out of Food in the Last Year: Never true  Transportation Needs: No Transportation Needs (03/27/2023)   PRAPARE - Administrator, Civil Service (Medical): No    Lack of Transportation (Non-Medical): No  Physical Activity: Not on file  Stress: Not on file  Social Connections: Unknown (03/27/2023)   Social Connection and Isolation Panel    Frequency of Communication with Friends and Family: More than three times a week    Frequency of Social Gatherings with Friends and Family: Once a week    Attends Religious Services: Patient declined    Database administrator or Organizations: No    Attends Banker Meetings: Never    Marital Status: Married  Catering manager Violence: Not At Risk (03/27/2023)   Humiliation, Afraid, Rape, and Kick questionnaire    Fear of Current or Ex-Partner: No    Emotionally Abused: No    Physically Abused: No    Sexually Abused: No    FAMILY HISTORY: Family History  Problem Relation Age of Onset   Bronchitis Mother    Memory loss Mother    Liver cancer Father    Diabetes Sister    Memory loss Sister     ALLERGIES:  is allergic to amoxicillin, amoxicillin-pot clavulanate, fluticasone propionate, nsaids, sulfa antibiotics, tetanus toxoid-containing vaccines, and tramadol  hcl.  MEDICATIONS:  Current Outpatient Medications  Medication Sig Dispense Refill   ALPRAZolam  (XANAX ) 0.25 MG tablet Take 0.25 mg by mouth at bedtime as needed.     ARTIFICIAL TEARS 0.1-0.3 % SOLN Place 2 drops into both eyes daily as needed (for  dry eye).     Calcium  Carb-Cholecalciferol 600-800 MG-UNIT TABS Take 1 tablet by mouth daily.     cetirizine (ZYRTEC) 10 MG tablet Take 10 mg by mouth daily.     DILT-XR 120 MG 24 hr capsule Take 120 mg by mouth daily.     donepezil  (ARICEPT ) 5 MG tablet Take 1 tablet (5 mg total) by mouth at bedtime. 30 tablet 6   escitalopram  (LEXAPRO ) 10 MG tablet Take 1 tablet by mouth daily.     ferrous sulfate  325 (65 FE) MG EC tablet Take 1 tablet (325 mg total) by mouth daily. 30 tablet PRN   insulin  glargine, 1 Unit Dial , (TOUJEO   SOLOSTAR) 300 UNIT/ML Solostar Pen Inject 40 Units into the skin every evening.     lisinopril  (ZESTRIL ) 2.5 MG tablet Take 2.5 mg by mouth daily.     metFORMIN  (GLUCOPHAGE -XR) 500 MG 24 hr tablet Take 1,000 mg by mouth 2 (two) times daily.     metoprolol  tartrate (LOPRESSOR ) 50 MG tablet Take 1 tablet (50 mg total) by mouth once for 1 dose. Take at 7:00AM the morning of your CT scan if your heart rate is greater than 80 beats per minute. 1 tablet 0   montelukast  (SINGULAIR ) 10 MG tablet Take 10 mg by mouth daily.     MOUNJARO 5 MG/0.5ML Pen SMARTSIG:5 Milligram(s) SUB-Q Once a Week     Omega-3 Fatty Acids (FISH OIL PO) Take 1 tablet by mouth in the morning and at bedtime.     pantoprazole  (PROTONIX ) 40 MG tablet Take 1 tablet (40 mg total) by mouth daily. 30 tablet 1   Potassium Gluconate 550 MG TABS Take 550 mg by mouth every evening.     rosuvastatin  (CRESTOR ) 5 MG tablet Take 5 mg by mouth daily.     No current facility-administered medications for this visit.    REVIEW OF SYSTEMS:   Constitutional: ( - ) fevers, ( - )  chills , ( - ) night sweats Eyes: ( - ) blurriness of vision, ( - ) double vision, ( - ) watery eyes Ears, nose, mouth, throat, and face: ( - ) mucositis, ( - ) sore throat Respiratory: ( - ) cough, ( - ) dyspnea, ( - ) wheezes Cardiovascular: ( - ) palpitation, ( - ) chest discomfort, ( - ) lower extremity swelling Gastrointestinal:  ( - ) nausea, ( -  ) heartburn, ( - ) change in bowel habits Skin: ( - ) abnormal skin rashes Lymphatics: ( - ) new lymphadenopathy, ( - ) easy bruising Neurological: ( - ) numbness, ( - ) tingling, ( - ) new weaknesses Behavioral/Psych: ( - ) mood change, ( - ) new changes  All other systems were reviewed with the patient and are negative.  PHYSICAL EXAMINATION: Vitals:   11/23/23 1053 11/23/23 1054  BP: (!) 101/36 (!) 106/58  Pulse: 60   Resp: 17   Temp: (!) 97.2 F (36.2 C)   SpO2: 97%     Filed Weights   11/23/23 1053  Weight: 154 lb 12.8 oz (70.2 kg)     GENERAL: well appearing elderly Caucasian female, alert, no distress and comfortable SKIN: skin color, texture, turgor are normal, no rashes or significant lesions EYES: conjunctiva are pink and non-injected, sclera clear LUNGS: clear to auscultation and percussion with normal breathing effort HEART: regular rate & rhythm and no murmurs and no lower extremity edema Musculoskeletal: no cyanosis of digits and no clubbing  PSYCH: alert & oriented x 3, fluent speech NEURO: no focal motor/sensory deficits  LABORATORY DATA:  I have reviewed the data as listed    Latest Ref Rng & Units 11/23/2023   10:34 AM 05/24/2023    8:59 AM 03/28/2023    5:43 AM  CBC  WBC 4.0 - 10.5 K/uL 15.0  13.2  16.9   Hemoglobin 12.0 - 15.0 g/dL 87.0  87.5  9.7   Hematocrit 36.0 - 46.0 % 39.6  39.8  32.1   Platelets 150 - 400 K/uL 110  142  154        Latest Ref Rng & Units 11/23/2023   10:34 AM 08/08/2023   12:17 PM  05/24/2023    8:59 AM  CMP  Glucose 70 - 99 mg/dL 868  818  762   BUN 8 - 23 mg/dL 15  20  15    Creatinine 0.44 - 1.00 mg/dL 9.40  9.44  9.39   Sodium 135 - 145 mmol/L 138  136  139   Potassium 3.5 - 5.1 mmol/L 4.3  4.4  4.3   Chloride 98 - 111 mmol/L 104  99  105   CO2 22 - 32 mmol/L 29  21  29    Calcium  8.9 - 10.3 mg/dL 89.4  9.2  9.5   Total Protein 6.5 - 8.1 g/dL 7.2   7.6   Total Bilirubin 0.0 - 1.2 mg/dL 0.5   0.3   Alkaline Phos 38  - 126 U/L 66   60   AST 15 - 41 U/L 10   11   ALT 0 - 44 U/L 6   8     RADIOGRAPHIC STUDIES: No results found.   ASSESSMENT & PLAN Erika [name] is a 68 y.o. female with medical history significant for leukocytosis who presents for a follow up visit.  # Leukocytosis, Neutrophilic Predominate  # Tier III CSF3R mutation -- Bone marrow biopsy does not show any evidence of underlying malignancy or concerning abnormalities. -- Discussed today the possible etiologies for her leukocytosis included the CSF3R mutation as well as untreated OSA. -- Encourage patient to follow-up with her sleep medicine doctor in order to further evaluate and consider treatment options including CPAP. -- Labs overall stable leukocytosis with WBC 15.0, ANC 12.1. -- RTC in 12 months with labs and follow up  #Thrombocytopenia: --Platelet count has oscillated over the past several months ranging from 100-160K.  --Today's platelet count is 110K without overt signs of bleeding.  --Continue to monitor.   # Hx of Severe Anemia-- resolved -- Iron panel and B12 levels show no evidence of deficiency. -- Continue on p.o. iron therapy. -- Advised if patient is taking b12 supplementation to decrease to 3 times a week as levels are greater than 4000.    No orders of the defined types were placed in this encounter.   All questions were answered. The patient knows to call the clinic with any problems, questions or concerns.   I have spent a total of 25 minutes minutes of face-to-face and non-face-to-face time, preparing to see the patient, performing a medically appropriate examination, counseling and educating the patient,  documenting clinical information in the electronic health record, independently interpreting results and communicating results to the patient, and care coordination.   Johnston Police PA-C Dept of Hematology and Oncology [facility] Phone:  [phone]   11/23/2023 11:14 AM

## 2024-01-09 ENCOUNTER — Ambulatory Visit: Admitting: Neurology

## 2024-01-11 ENCOUNTER — Ambulatory Visit: Admitting: Diagnostic Neuroimaging

## 2024-01-11 ENCOUNTER — Encounter: Payer: Self-pay | Admitting: Diagnostic Neuroimaging

## 2024-01-11 VITALS — BP 105/66 | HR 90 | Ht 64.0 in | Wt 161.2 lb

## 2024-01-11 DIAGNOSIS — F02A0 Dementia in other diseases classified elsewhere, mild, without behavioral disturbance, psychotic disturbance, mood disturbance, and anxiety: Secondary | ICD-10-CM

## 2024-01-11 DIAGNOSIS — R413 Other amnesia: Secondary | ICD-10-CM

## 2024-01-11 DIAGNOSIS — G301 Alzheimer's disease with late onset: Secondary | ICD-10-CM | POA: Diagnosis not present

## 2024-01-11 NOTE — Patient Instructions (Signed)
 MILD-MODERATE DEMENTIA (MMSE 19/30, decline in ADLs) - increase to donepezil  10mg  at bedtime - start memantine 10mg  at bedtime; increase to twice a day after 1-2 weeks - try to stay active physically and get some exercise (at least 15-30 minutes per day) - eat a nutritious diet with lean protein, plants / vegetables, whole grains; avoid ultra-processed foods - increase social activities, brain stimulation, games, puzzles, hobbies, crafts, arts, music; try new activities; keep it fun! - aim for at least 7-8 hours sleep per night (or more) - avoid smoking and alcohol  - caution with medications, finances, driving - safety / supervision issues reviewed - caregiver resources reviewed (including [email or web address])

## 2024-01-11 NOTE — Progress Notes (Unsigned)
 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  Symptoms started around 2020 with gradual onset and progressive short-term memory loss and cognitive changes.  Symptoms gradual worse worse over time.  Now ADLs are getting affected.  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  Patient lives with husband and daughter.  She has good family support.  Mood is stable.  Tolerating medications.   REVIEW OF SYSTEMS: Full 14 system review of systems performed and negative with exception of: as per HPI.  ALLERGIES: Allergies  Allergen Reactions   Amoxicillin Hives    Only one case of HIVES.  Has taken it in the past.   Amoxicillin-Pot Clavulanate Hives   Fluticasone Propionate Other (See Comments)    Foot cramps-mild   Nsaids Nausea And Vomiting   Sulfa Antibiotics Nausea And Vomiting   Tetanus Toxoid-Containing Vaccines Other (See Comments)    Very high fever   Tramadol  Hcl Other (See Comments)    Agitated    HOME MEDICATIONS: Outpatient Medications Prior to Visit  Medication Sig Dispense Refill   acetaminophen  (TYLENOL ) 325 MG tablet Take 650 mg by mouth every 6 (six) hours as needed for moderate pain (pain score 4-6).     ALPRAZolam  (XANAX ) 0.25 MG tablet Take 0.25 mg by mouth at bedtime as needed.     ARTIFICIAL TEARS  0.1-0.3 % SOLN Place 2 drops into both eyes daily as needed (for dry eye).     Calcium  Carb-Cholecalciferol 600-800 MG-UNIT TABS Take 1 tablet by mouth daily.     cetirizine (ZYRTEC) 10 MG tablet Take 10 mg by mouth daily.     DILT-XR 120 MG 24 hr capsule Take 120 mg by mouth daily.     donepezil  (ARICEPT ) 5 MG tablet Take 1 tablet (5 mg total) by mouth at bedtime. 30 tablet 6   escitalopram  (LEXAPRO ) 10 MG tablet Take 1 tablet by mouth daily.     ferrous sulfate  325 (65 FE) MG EC tablet Take 1 tablet (325 mg total) by mouth daily. 30 tablet PRN   insulin  glargine, 1 Unit Dial , (TOUJEO  SOLOSTAR) 300 UNIT/ML Solostar Pen Inject 40 Units into the skin every evening.     lisinopril  (ZESTRIL ) 2.5 MG tablet Take 2.5 mg by mouth daily.     metFORMIN  (GLUCOPHAGE -XR) 500 MG 24 hr tablet Take 1,000 mg by mouth 2 (two) times daily.     metoprolol  tartrate (LOPRESSOR ) 50 MG tablet Take 1 tablet (50 mg total) by mouth once for 1 dose. Take at 7:00AM the morning of your CT scan if your heart rate is greater than 80 beats per minute. 1 tablet 0   montelukast  (SINGULAIR ) 10 MG tablet Take 10 mg by mouth daily.     MOUNJARO 5 MG/0.5ML Pen SMARTSIG:5 Milligram(s) SUB-Q Once a Week     Omega-3 Fatty Acids (FISH OIL  PO) Take 1 tablet by mouth in the morning and at bedtime.     pantoprazole  (PROTONIX ) 40 MG tablet Take 1 tablet (40 mg total) by mouth daily. 30 tablet 1   Potassium Gluconate 550 MG TABS Take 550 mg by mouth every evening.     rosuvastatin  (CRESTOR ) 5 MG tablet Take 5 mg by mouth daily.     No facility-administered medications prior to visit.    PAST MEDICAL HISTORY: Past Medical History:  Diagnosis Date   Anemia    just related to fibroids   Anxiety    takes Clonazepam  daily as needed   Arthritis    wrists; neck (04/10/2013)   Basal cell carcinoma of nasal tip    Cataract    bilateral   Cramping of feet    takes Potassium daily   Dysphagia    History of colon polyps     Hyperlipidemia    takes Crestor  nightly because I'm a diabetic   Hypertension    Insomnia    takes Benadryl  nightly   Joint pain    Joint swelling    Neuromuscular disorder (HCC)    feet   Neuropathy    both feet   Osteoarthritis of right knee 04/10/2013   Seasonal allergies    takes Omnaris and Claritin  daily as needed   Sleep apnea    SVD (spontaneous vaginal delivery)    x 1   Type II diabetes mellitus (HCC)    takes Janumet  daily   Urinary frequency     PAST SURGICAL HISTORY: Past Surgical History:  Procedure Laterality Date   ABDOMINAL HYSTERECTOMY     ANTERIOR AND POSTERIOR REPAIR N/A 02/06/2014   Procedure: ANTERIOR (CYSTOCELE) AND POSTERIOR REPAIR (RECTOCELE);  Surgeon: Krystal Deaner, MD;  Location: [facility];  Service: Gynecology;  Laterality: N/A;   BONE SPURS Left 10/09/1968   ankle   CLOSED REDUCTION NASAL FRACTURE  ~ 2012   COLONOSCOPY     HYSTEROSCOPY WITH RESECTOSCOPE  02/08/2002   w/fibroids removed   JOINT REPLACEMENT     right knee   MELANOMA EXCISION  02/09/2012   tip of nose   TONSILLECTOMY  ~ 1959   TOTAL KNEE ARTHROPLASTY Right 04/10/2013   TOTAL KNEE ARTHROPLASTY Right 04/10/2013   Procedure: TOTAL KNEE ARTHROPLASTY;  Surgeon: Fonda SHAUNNA Olmsted, MD;  Location: [facility];  Service: Orthopedics;  Laterality: Right;   VAGINAL HYSTERECTOMY N/A 02/06/2014   Procedure: HYSTERECTOMY VAGINAL;  Surgeon: Krystal Deaner, MD;  Location: [facility];  Service: Gynecology;  Laterality: N/A;    FAMILY HISTORY: Family History  Problem Relation Age of Onset   Bronchitis Mother    Memory loss Mother    Liver cancer Father    Diabetes Sister    Memory loss Sister     SOCIAL HISTORY: Social History   Socioeconomic History   Marital status: Married    Spouse name: Not on file   Number of children: Not on file   Years of education: Not on file   Highest education level: Not on file  Occupational History   Not on file  Tobacco Use   Smoking status: Never    Smokeless tobacco: Never  Vaping Use   Vaping status: Never Used  Substance and Sexual Activity   Alcohol  use: Yes    Alcohol /week: 1.0 standard drink of alcohol     Types: 1 Glasses of wine per week    Comment: socially   Drug use: No   Sexual activity: Yes  Birth control/protection: Post-menopausal  Other Topics Concern   Not on file  Social History Narrative   Pt lives with family    Retired    1 cup of coffee in the morning, some soda    Social Drivers of Corporate Investment Banker Strain: Not on file  Food Insecurity: No Food Insecurity (03/27/2023)   Hunger Vital Sign    Worried About Running Out of Food in the Last Year: Never true    Ran Out of Food in the Last Year: Never true  Transportation Needs: No Transportation Needs (03/27/2023)   PRAPARE - Administrator, Civil Service (Medical): No    Lack of Transportation (Non-Medical): No  Physical Activity: Not on file  Stress: Not on file  Social Connections: Unknown (03/27/2023)   Social Connection and Isolation Panel    Frequency of Communication with Friends and Family: More than three times a week    Frequency of Social Gatherings with Friends and Family: Once a week    Attends Religious Services: Patient declined    Database Administrator or Organizations: No    Attends Banker Meetings: Never    Marital Status: Married  Catering Manager Violence: Not At Risk (03/27/2023)   Humiliation, Afraid, Rape, and Kick questionnaire    Fear of Current or Ex-Partner: No    Emotionally Abused: No    Physically Abused: No    Sexually Abused: No     PHYSICAL EXAM  GENERAL EXAM/CONSTITUTIONAL: Vitals:  Vitals:   01/11/24 1111  BP: 105/66  Pulse: 90  SpO2: 99%  Weight: 161 lb 3.2 oz (73.1 kg)  Height: 5' 4 (1.626 m)   Body mass index is 27.67 kg/m. Wt Readings from Last 3 Encounters:  01/11/24 161 lb 3.2 oz (73.1 kg)  11/23/23 154 lb 12.8 oz (70.2 kg)  08/08/23 153 lb (69.4 kg)    Patient is in no distress; well developed, nourished and groomed; neck is supple  CARDIOVASCULAR: Examination of carotid arteries is normal; no carotid bruits IRREGULAR RATE AND RHYTHM Examination of peripheral vascular system by observation and palpation is normal  EYES: Ophthalmoscopic exam of optic discs and posterior segments is normal; no papilledema or hemorrhages No results found.  MUSCULOSKELETAL: Gait, strength, tone, movements noted in Neurologic exam below  NEUROLOGIC: MENTAL STATUS:     01/11/2024   11:19 AM 08/08/2023   11:27 AM  MMSE - Mini Mental State Exam  Orientation to time 2 0  Orientation to Place 5 5  Registration 3 3  Attention/ Calculation 0 1  Recall 0 1  Language- name 2 objects 2 2  Language- repeat 1 1  Language- follow 3 step command 3 3  Language- read & follow direction 1 1  Write a sentence 1 1  Copy design 1 0  Total score 19 18   awake, alert, oriented to person, place DECR MEMORY DECR attention and concentration language fluent, comprehension intact, naming intact fund of knowledge appropriate  CRANIAL NERVE:  2nd - no papilledema on fundoscopic exam 2nd, 3rd, 4th, 6th - pupils equal and reactive to light, visual fields full to confrontation, extraocular muscles intact, no nystagmus 5th - facial sensation symmetric 7th - facial strength symmetric 8th - hearing intact 9th - palate elevates symmetrically, uvula midline 11th - shoulder shrug symmetric 12th - tongue protrusion midline  MOTOR:  normal bulk and tone, full strength in the BUE, BLE  SENSORY:  normal and symmetric to light touch,  temperature, vibration  COORDINATION:  finger-nose-finger, fine finger movements normal  REFLEXES:  deep tendon reflexes 1+ and symmetric  GAIT/STATION:  narrow based gait     DIAGNOSTIC DATA (LABS, IMAGING, TESTING) - I reviewed patient records, labs, notes, testing and imaging myself where available.  Lab Results  Component  Value Date   WBC 15.0 (H) 11/23/2023   HGB 12.9 11/23/2023   HCT 39.6 11/23/2023   MCV 90.2 11/23/2023   PLT 110 (L) 11/23/2023      Component Value Date/Time   NA 138 11/23/2023 1034   NA 136 08/08/2023 1217   K 4.3 11/23/2023 1034   CL 104 11/23/2023 1034   CO2 29 11/23/2023 1034   GLUCOSE 131 (H) 11/23/2023 1034   BUN 15 11/23/2023 1034   BUN 20 08/08/2023 1217   CREATININE 0.59 11/23/2023 1034   CALCIUM  10.5 (H) 11/23/2023 1034   PROT 7.2 11/23/2023 1034   ALBUMIN 4.0 11/23/2023 1034   AST 10 (L) 11/23/2023 1034   ALT 6 11/23/2023 1034   ALKPHOS 66 11/23/2023 1034   BILITOT 0.5 11/23/2023 1034   GFRNONAA >60 11/23/2023 1034   GFRAA >90 01/29/2014 0810   No results found for: CHOL, HDL, LDLCALC, LDLDIRECT, TRIG, CHOLHDL Lab Results  Component Value Date   HGBA1C 8.7 (H) 03/27/2023   Lab Results  Component Value Date   VITAMINB12 >4,000 (H) 11/23/2023   Lab Results  Component Value Date   TSH 2.390 08/08/2023   Component Ref Range & Units (hover) 5 mo ago  A -- Beta-amyloid 42/40 Ratio 0.095 Low   Beta-amyloid 42 19.01  Beta-amyloid 40 200.25  T -- p-tau181 1.17 High   N -- NfL, Plasma 3.38  ATN SUMMARY Comment  Comment:                        A+ T+ N- A low beta-amyloid 42/40 and a high pTau181 concentration were observed. A normal NfL concentration was observed at this time. These results are consistent with the presence of Alzheimer's related pathology.   APO E Genotyping Result: Comment  Comment: E3/E4 (one copy of the APOE4 variant)   02/15/23 CT head  - Atrophy, chronic microvascular disease. - No acute intracranial abnormality.   ASSESSMENT AND PLAN  68 y.o. year old female here with:  Dx:  1. Memory loss   2. Mild late onset Alzheimer's dementia without behavioral disturbance, psychotic disturbance, mood disturbance, or anxiety (HCC)      PLAN:  MILD-MODERATE DEMENTIA (MMSE 19/30, decline in ADLs) - increase to donepezil   10mg  at bedtime - consider memantine 10mg  at bedtime; increase to twice a day after 1-2 weeks - try to stay active physically and get some exercise (at least 15-30 minutes per day) - eat a nutritious diet with lean protein, plants / vegetables, whole grains; avoid ultra-processed foods - increase social activities, brain stimulation, games, puzzles, hobbies, crafts, arts, music; try new activities; keep it fun! - aim for at least 7-8 hours sleep per night (or more) - avoid smoking and alcohol  - caution with medications, finances, driving - safety / supervision issues reviewed - caregiver resources reviewed (including [email or web address])  Return for return to PCP, pending if symptoms worsen or fail to improve.    EDUARD FABIENE HANLON, MD 01/12/2024, 1:17 PM Certified in Neurology, Neurophysiology and Neuroimaging  [facility] [address] [place], [place] [address] [phone]

## 2024-11-23 ENCOUNTER — Inpatient Hospital Stay

## 2024-11-23 ENCOUNTER — Inpatient Hospital Stay: Admitting: Hematology and Oncology
